# Patient Record
Sex: Male | Born: 1955 | Race: Black or African American | Hispanic: No | Smoking: Current every day smoker
Health system: Southern US, Community
[De-identification: ages and names within clinical notes are randomized; demographics above are authoritative.]

## PROBLEM LIST (undated history)

## (undated) DIAGNOSIS — Z955 Presence of coronary angioplasty implant and graft: Secondary | ICD-10-CM

## (undated) DIAGNOSIS — I2510 Atherosclerotic heart disease of native coronary artery without angina pectoris: Secondary | ICD-10-CM

## (undated) DIAGNOSIS — Z720 Tobacco use: Secondary | ICD-10-CM

## (undated) DIAGNOSIS — I639 Cerebral infarction, unspecified: Secondary | ICD-10-CM

## (undated) DIAGNOSIS — I10 Essential (primary) hypertension: Secondary | ICD-10-CM

## (undated) DIAGNOSIS — F1010 Alcohol abuse, uncomplicated: Secondary | ICD-10-CM

## (undated) HISTORY — PX: CORONARY STENT PLACEMENT: SHX1402

---

## 2000-08-12 ENCOUNTER — Encounter: Payer: Self-pay | Admitting: *Deleted

## 2000-08-12 ENCOUNTER — Emergency Department (HOSPITAL_COMMUNITY): Admission: EM | Admit: 2000-08-12 | Discharge: 2000-08-12 | Payer: Self-pay | Admitting: Emergency Medicine

## 2002-05-15 ENCOUNTER — Emergency Department (HOSPITAL_COMMUNITY): Admission: EM | Admit: 2002-05-15 | Discharge: 2002-05-15 | Payer: Self-pay | Admitting: Emergency Medicine

## 2002-05-15 ENCOUNTER — Encounter: Payer: Self-pay | Admitting: Emergency Medicine

## 2002-10-23 ENCOUNTER — Emergency Department (HOSPITAL_COMMUNITY): Admission: EM | Admit: 2002-10-23 | Discharge: 2002-10-23 | Payer: Self-pay | Admitting: Emergency Medicine

## 2003-10-21 ENCOUNTER — Emergency Department (HOSPITAL_COMMUNITY): Admission: EM | Admit: 2003-10-21 | Discharge: 2003-10-21 | Payer: Self-pay | Admitting: Emergency Medicine

## 2005-04-30 ENCOUNTER — Emergency Department (HOSPITAL_COMMUNITY): Admission: EM | Admit: 2005-04-30 | Discharge: 2005-04-30 | Payer: Self-pay | Admitting: Emergency Medicine

## 2006-02-21 ENCOUNTER — Emergency Department (HOSPITAL_COMMUNITY): Admission: EM | Admit: 2006-02-21 | Discharge: 2006-02-22 | Payer: Self-pay | Admitting: *Deleted

## 2007-03-28 ENCOUNTER — Ambulatory Visit: Payer: Self-pay | Admitting: Internal Medicine

## 2007-04-02 ENCOUNTER — Emergency Department (HOSPITAL_COMMUNITY): Admission: EM | Admit: 2007-04-02 | Discharge: 2007-04-02 | Payer: Self-pay | Admitting: Emergency Medicine

## 2009-11-04 ENCOUNTER — Emergency Department (HOSPITAL_COMMUNITY): Admission: EM | Admit: 2009-11-04 | Discharge: 2009-11-04 | Payer: Self-pay | Admitting: Emergency Medicine

## 2010-05-27 ENCOUNTER — Emergency Department (HOSPITAL_COMMUNITY): Admission: EM | Admit: 2010-05-27 | Discharge: 2010-05-27 | Payer: Self-pay | Admitting: Emergency Medicine

## 2010-05-27 ENCOUNTER — Ambulatory Visit: Payer: Self-pay | Admitting: Psychiatry

## 2010-05-27 ENCOUNTER — Inpatient Hospital Stay (HOSPITAL_COMMUNITY): Admission: RE | Admit: 2010-05-27 | Discharge: 2010-06-02 | Payer: Self-pay | Admitting: Psychiatry

## 2011-03-12 LAB — CBC
HCT: 44.5 % (ref 39.0–52.0)
Hemoglobin: 15.3 g/dL (ref 13.0–17.0)
MCHC: 34.4 g/dL (ref 30.0–36.0)
MCHC: 33.5 g/dL (ref 30.0–36.0)
RBC: 4.36 MIL/uL (ref 4.22–5.81)
RBC: 3.66 MIL/uL — ABNORMAL LOW (ref 4.22–5.81)
WBC: 10.8 10*3/uL — ABNORMAL HIGH (ref 4.0–10.5)

## 2011-03-12 LAB — GLUCOSE, CAPILLARY
Glucose-Capillary: 132 mg/dL — ABNORMAL HIGH (ref 70–99)
Glucose-Capillary: 134 mg/dL — ABNORMAL HIGH (ref 70–99)
Glucose-Capillary: 139 mg/dL — ABNORMAL HIGH (ref 70–99)
Glucose-Capillary: 155 mg/dL — ABNORMAL HIGH (ref 70–99)
Glucose-Capillary: 191 mg/dL — ABNORMAL HIGH (ref 70–99)
Glucose-Capillary: 207 mg/dL — ABNORMAL HIGH (ref 70–99)
Glucose-Capillary: 225 mg/dL — ABNORMAL HIGH (ref 70–99)
Glucose-Capillary: 276 mg/dL — ABNORMAL HIGH (ref 70–99)
Glucose-Capillary: 301 mg/dL — ABNORMAL HIGH (ref 70–99)
Glucose-Capillary: 351 mg/dL — ABNORMAL HIGH (ref 70–99)
Glucose-Capillary: 390 mg/dL — ABNORMAL HIGH (ref 70–99)

## 2011-03-12 LAB — URINALYSIS, ROUTINE W REFLEX MICROSCOPIC
Glucose, UA: 500 mg/dL — AB
Ketones, ur: NEGATIVE mg/dL
Ketones, ur: NEGATIVE mg/dL
Leukocytes, UA: NEGATIVE
Nitrite: NEGATIVE
Protein, ur: NEGATIVE mg/dL
Specific Gravity, Urine: 1.015 (ref 1.005–1.030)
Urobilinogen, UA: 1 mg/dL (ref 0.0–1.0)
pH: 5.5 (ref 5.0–8.0)

## 2011-03-12 LAB — BASIC METABOLIC PANEL
CO2: 24 mEq/L (ref 19–32)
Calcium: 9.7 mg/dL (ref 8.4–10.5)
GFR calc Af Amer: >60 mL/min (ref >60)
GFR calc non Af Amer: >60 mL/min (ref >60)
Potassium: 3.6 mEq/L (ref 3.5–5.1)
Sodium: 135 mEq/L (ref 135–145)

## 2011-03-12 LAB — RAPID URINE DRUG SCREEN, HOSP PERFORMED
Amphetamines: NOT DETECTED
Benzodiazepines: NOT DETECTED
Cocaine: POSITIVE — AB
Tetrahydrocannabinol: NOT DETECTED

## 2011-03-12 LAB — DIFFERENTIAL
Lymphocytes Relative: 20 % (ref 12–46)
Monocytes Absolute: 1 10*3/uL (ref 0.1–1.0)
Monocytes Relative: 7 % (ref 3–12)
Neutro Abs: 10.9 10*3/uL — ABNORMAL HIGH (ref 1.7–7.7)

## 2011-03-12 LAB — ETHANOL: Alcohol, Ethyl (B): <5 mg/dL (ref 0–10)

## 2011-03-12 LAB — HEPATIC FUNCTION PANEL
Alkaline Phosphatase: 97 U/L (ref 39–117)
Total Protein: 6 g/dL (ref 6.0–8.3)

## 2011-03-12 LAB — HEMOGLOBIN A1C
Hgb A1c MFr Bld: 7.6 % — ABNORMAL HIGH (ref <5.7)
Mean Plasma Glucose: 171 mg/dL — ABNORMAL HIGH (ref <117)

## 2012-07-03 ENCOUNTER — Emergency Department (HOSPITAL_COMMUNITY)
Admission: EM | Admit: 2012-07-03 | Discharge: 2012-07-04 | Disposition: A | Discharge status: Home / Self Care | Payer: Non-veteran care | Attending: Emergency Medicine | Admitting: Emergency Medicine

## 2012-07-03 ENCOUNTER — Other Ambulatory Visit: Payer: Self-pay

## 2012-07-03 ENCOUNTER — Emergency Department (HOSPITAL_COMMUNITY): Payer: Non-veteran care

## 2012-07-03 DIAGNOSIS — Z7982 Long term (current) use of aspirin: Secondary | ICD-10-CM | POA: Insufficient documentation

## 2012-07-03 DIAGNOSIS — I10 Essential (primary) hypertension: Secondary | ICD-10-CM | POA: Insufficient documentation

## 2012-07-03 DIAGNOSIS — R079 Chest pain, unspecified: Secondary | ICD-10-CM | POA: Insufficient documentation

## 2012-07-03 DIAGNOSIS — I2510 Atherosclerotic heart disease of native coronary artery without angina pectoris: Secondary | ICD-10-CM | POA: Insufficient documentation

## 2012-07-03 DIAGNOSIS — E119 Type 2 diabetes mellitus without complications: Secondary | ICD-10-CM | POA: Insufficient documentation

## 2012-07-03 DIAGNOSIS — R61 Generalized hyperhidrosis: Secondary | ICD-10-CM | POA: Insufficient documentation

## 2012-07-03 DIAGNOSIS — R0602 Shortness of breath: Secondary | ICD-10-CM | POA: Insufficient documentation

## 2012-07-03 DIAGNOSIS — Z79899 Other long term (current) drug therapy: Secondary | ICD-10-CM | POA: Insufficient documentation

## 2012-07-03 DIAGNOSIS — R110 Nausea: Secondary | ICD-10-CM | POA: Insufficient documentation

## 2012-07-03 HISTORY — DX: Essential (primary) hypertension: I10

## 2012-07-03 HISTORY — DX: Presence of coronary angioplasty implant and graft: Z95.5

## 2012-07-03 HISTORY — DX: Atherosclerotic heart disease of native coronary artery without angina pectoris: I25.10

## 2012-07-03 LAB — CBC
MCH: 33.5 pg (ref 26.0–34.0)
MCV: 96.3 fL (ref 78.0–100.0)
Platelets: 259 10*3/uL (ref 150–400)
RBC: 3.76 MIL/uL — ABNORMAL LOW (ref 4.22–5.81)
RDW: 12.3 % (ref 11.5–15.5)
WBC: 9 10*3/uL (ref 4.0–10.5)

## 2012-07-03 LAB — POCT I-STAT TROPONIN I: Troponin i, poc: 0.02 ng/mL (ref 0.00–0.08)

## 2012-07-03 LAB — BASIC METABOLIC PANEL
CO2: 26 mEq/L (ref 19–32)
Calcium: 9.5 mg/dL (ref 8.4–10.5)
Chloride: 103 mEq/L (ref 96–112)
Creatinine, Ser: 0.82 mg/dL (ref 0.50–1.35)
GFR calc Af Amer: >90 mL/min (ref >90)
Sodium: 141 mEq/L (ref 135–145)

## 2012-07-03 NOTE — ED Notes (Signed)
Patient C/O having chest pain. States that he was given NTG and ASA pre hospital and he is now pain free. States the he has had stents placed in the past.  Pain is described as pressure in the center of his chest and did not radiate. Patient C/O dyspnea also.  States that he had a spell a week ago where his whole left side went numb.  He states that he feels normal now.

## 2012-07-03 NOTE — ED Notes (Signed)
Patient transported to X-ray 

## 2012-07-03 NOTE — ED Notes (Addendum)
Patient was walking down town and decided to stop and have a drink.  States that he had 2 beers and started walking home.  Began having chest pain. Pain is in the center of his chest and non-radiating. Pain described as a pressure and was rated 5/10.

## 2012-07-04 ENCOUNTER — Encounter (HOSPITAL_COMMUNITY): Payer: Self-pay | Admitting: Emergency Medicine

## 2012-07-04 MED ORDER — LISINOPRIL-HYDROCHLOROTHIAZIDE 20-25 MG PO TABS: 1.0000 | ORAL_TABLET | Freq: Every day | ORAL | Status: DC

## 2012-07-04 MED ORDER — CLOPIDOGREL BISULFATE 75 MG PO TABS: 75.0000 mg | ORAL_TABLET | Freq: Every morning | ORAL | Status: DC

## 2012-07-04 MED ORDER — ASPIRIN 81 MG PO CHEW
324.0000 mg | CHEWABLE_TABLET | Freq: Once | ORAL | Status: AC
  Administered 2012-07-04: 324 mg via ORAL
  Filled 2012-07-04: qty 4

## 2012-07-04 MED ORDER — ASPIRIN EC 81 MG PO TBEC: 81.0000 mg | DELAYED_RELEASE_TABLET | Freq: Every day | ORAL | Status: DC

## 2012-07-04 MED ORDER — AMLODIPINE BESYLATE 2.5 MG PO TABS: 2.5000 mg | ORAL_TABLET | Freq: Every day | ORAL | Status: DC

## 2012-07-04 MED ORDER — SERTRALINE HCL 100 MG PO TABS: 100.0000 mg | ORAL_TABLET | Freq: Two times a day (BID) | ORAL | Status: DC

## 2012-07-04 MED ORDER — METFORMIN HCL 1000 MG PO TABS: 1000.0000 mg | ORAL_TABLET | Freq: Two times a day (BID) | ORAL | Status: AC

## 2012-07-04 MED ORDER — CLOPIDOGREL BISULFATE 75 MG PO TABS
75.0000 mg | ORAL_TABLET | Freq: Once | ORAL | Status: AC
  Administered 2012-07-04: 75 mg via ORAL
  Filled 2012-07-04: qty 1

## 2012-07-04 NOTE — ED Notes (Signed)
MD at bedside. 

## 2012-07-04 NOTE — ED Notes (Signed)
Pt ambulatory at discharge, pt alert and oriented x 4.  

## 2012-07-04 NOTE — ED Provider Notes (Signed)
History     CSN: [id number]  Arrival date & time 07/03/12  2255   First MD Initiated Contact with Patient 07/04/12 0031      Chief Complaint  Patient presents with  . Chest Pain    (Consider location/radiation/quality/duration/timing/severity/associated sxs/prior treatment) HPI This is a 56 year old black male who was recently stented for coronary artery disease. This was done at [facility]. He was walking downtown several hours ago and developed moderate chest discomfort. He describes the discomfort as pressure-like. It is like the pain he had prior to his stenting. It was associated with shortness of breath, diaphoresis and nausea. EMS was called and they gave him 2 or 3 sublingual nitroglycerin. This relieved his pain completely. He is pain-free at this time.  Past Medical History  Diagnosis Date  . Coronary artery disease   . Diabetes mellitus   . Hypertension   . Stented coronary artery     History reviewed. No pertinent past surgical history.  No family history on file.  History  Substance Use Topics  . Smoking status: Not on file  . Smokeless tobacco: Not on file  . Alcohol Use: Yes      Review of Systems  All other systems reviewed and are negative.    Allergies  Iodine  Home Medications   Current Outpatient Rx  Name Route Sig Dispense Refill  . AMLODIPINE BESYLATE 2.5 MG PO TABS Oral Take 2.5 mg by mouth daily.    . ASPIRIN EC 81 MG PO TBEC Oral Take 81 mg by mouth daily.    Marland Kitchen CLOPIDOGREL BISULFATE 75 MG PO TABS Oral Take 75 mg by mouth every morning.    Marland Kitchen LISINOPRIL-HYDROCHLOROTHIAZIDE 20-25 MG PO TABS Oral Take 1 tablet by mouth daily.    Marland Kitchen METFORMIN HCL 1000 MG PO TABS Oral Take 1,000 mg by mouth 2 (two) times daily with a meal.    . SERTRALINE HCL 100 MG PO TABS Oral Take 100 mg by mouth 2 (two) times daily.      BP 139/61  Pulse 69  Temp 98.2 F (36.8 C) (Oral)  Resp 20  SpO2 100%  Physical Exam General: Well-developed,  well-nourished male in no acute distress; appearance consistent with age of record HENT: normocephalic, atraumatic Eyes: pupils equal round and reactive to light; extraocular muscles intact; arcus senilis bilaterally Neck: supple Heart: regular rate and rhythm Lungs: clear to auscultation bilaterally Abdomen: soft; nondistended; nontender; hyperactive bowel so Extremities: No deformity; full range of motion Neurologic: Awake, alert and oriented; motor function intact in all extremities and symmetric; no facial droop Skin: Warm and dry Psychiatric: Normal mood and affect    ED Course  Procedures (including critical care time)     MDM   Nursing notes and vitals signs, including pulse oximetry, reviewed.  Summary of this visit's results, reviewed by myself:  Labs:  Results for orders placed during the hospital encounter of 07/03/12  CBC      Component Value Range   WBC 9.0  4.0 - 10.5 K/uL   RBC 3.76 (*) 4.22 - 5.81 MIL/uL   Hemoglobin 12.6 (*) 13.0 - 17.0 g/dL   HCT 78.2 (*) 95.6 - 21.3 %   MCV 96.3  78.0 - 100.0 fL   MCH 33.5  26.0 - 34.0 pg   MCHC 34.8  30.0 - 36.0 g/dL   RDW 08.6  57.8 - 46.9 %   Platelets 259  150 - 400 K/uL  BASIC METABOLIC PANEL  Component Value Range   Sodium 141  135 - 145 mEq/L   Potassium 3.7  3.5 - 5.1 mEq/L   Chloride 103  96 - 112 mEq/L   CO2 26  19 - 32 mEq/L   Glucose, Bld 194 (*) 70 - 99 mg/dL   BUN 7  6 - 23 mg/dL   Creatinine, Ser 1.91  0.50 - 1.35 mg/dL   Calcium 9.5  8.4 - 47.8 mg/dL   GFR calc non Af Amer >90  >90 mL/min   GFR calc Af Amer >90  >90 mL/min  POCT I-STAT TROPONIN I      Component Value Range   Troponin i, poc 0.02  0.00 - 0.08 ng/mL   Comment 3           POCT I-STAT TROPONIN I      Component Value Range   Troponin i, poc 0.02  0.00 - 0.08 ng/mL   Comment 3             Imaging Studies: Dg Chest 2 View  07/03/2012  *RADIOLOGY REPORT*  Clinical Data: Shortness of breath and chest pain.  CHEST - 2 VIEW   Comparison: Chest radiograph performed 05/27/2010  Findings: The lungs are well-aerated.  Mild peribronchial thickening is seen.  There is no evidence of focal opacification, pleural effusion or pneumothorax.  The heart is normal in size; the mediastinal contour is within normal limits.  No acute osseous abnormalities are seen.  IMPRESSION: Mild peribronchial thickening noted; lungs otherwise grossly clear.  Original Report Authenticated By: Tonia Ghent, M.D.    EKG Interpretation:  Date & Time: 07/04/2012 10:58 PM  Rate: 72  Rhythm: normal sinus rhythm  QRS Axis: normal  Intervals: normal  ST/T Wave abnormalities: T wave inversions inferolaterally  Conduction Disutrbances:left anterior fascicular block  Narrative Interpretation:   Old EKG Reviewed: none available  3:32 AM Dr. Fransisco Hertz of cardiology at [facility] was consulted. He states that per their protocol, recently stented patient's with chest pain are evaluated with EKG and 2 troponins 3 hours apart. As these have been negative he feels the patient can be safely discharged home. He stated the importance of the patient continuing to take his Plavix. The patient states he ran out 2 days ago. We will refill his medications. He is to followup with Dr. Ivan Croft of cardiology in [place]. He was also counseled against cocaine use.             Hanley Seamen, MD 07/04/12 [phone]

## 2012-12-30 ENCOUNTER — Emergency Department (HOSPITAL_COMMUNITY): Payer: Non-veteran care

## 2012-12-30 ENCOUNTER — Encounter (HOSPITAL_COMMUNITY): Payer: Self-pay | Admitting: *Deleted

## 2012-12-30 ENCOUNTER — Emergency Department (HOSPITAL_COMMUNITY)
Admission: EM | Admit: 2012-12-30 | Discharge: 2012-12-31 | Disposition: A | Discharge status: Home / Self Care | Payer: Non-veteran care | Attending: Emergency Medicine | Admitting: Emergency Medicine

## 2012-12-30 DIAGNOSIS — E119 Type 2 diabetes mellitus without complications: Secondary | ICD-10-CM | POA: Insufficient documentation

## 2012-12-30 DIAGNOSIS — Y9289 Other specified places as the place of occurrence of the external cause: Secondary | ICD-10-CM | POA: Insufficient documentation

## 2012-12-30 DIAGNOSIS — I2510 Atherosclerotic heart disease of native coronary artery without angina pectoris: Secondary | ICD-10-CM | POA: Insufficient documentation

## 2012-12-30 DIAGNOSIS — Y9301 Activity, walking, marching and hiking: Secondary | ICD-10-CM | POA: Insufficient documentation

## 2012-12-30 DIAGNOSIS — W172XXA Fall into hole, initial encounter: Secondary | ICD-10-CM | POA: Insufficient documentation

## 2012-12-30 DIAGNOSIS — Z9861 Coronary angioplasty status: Secondary | ICD-10-CM | POA: Insufficient documentation

## 2012-12-30 DIAGNOSIS — Z7982 Long term (current) use of aspirin: Secondary | ICD-10-CM | POA: Insufficient documentation

## 2012-12-30 DIAGNOSIS — S93609A Unspecified sprain of unspecified foot, initial encounter: Secondary | ICD-10-CM | POA: Insufficient documentation

## 2012-12-30 DIAGNOSIS — Z79899 Other long term (current) drug therapy: Secondary | ICD-10-CM | POA: Insufficient documentation

## 2012-12-30 DIAGNOSIS — I10 Essential (primary) hypertension: Secondary | ICD-10-CM | POA: Insufficient documentation

## 2012-12-30 DIAGNOSIS — S93602A Unspecified sprain of left foot, initial encounter: Secondary | ICD-10-CM

## 2012-12-30 NOTE — ED Notes (Signed)
The pt stepped in a hole earlier today and injured his lt foot.  Painful since

## 2012-12-31 NOTE — ED Provider Notes (Signed)
History     CSN: [id number]  Arrival date & time 12/30/12  2259   None     Chief Complaint  Patient presents with  . Foot Injury    (Consider location/radiation/quality/duration/timing/severity/associated sxs/prior treatment) HPI History provided by pt.   Pt stepped in a hole today while working out in the yard, inverted in his left foot, and now has severe, constant pain in forefoot and ankle.  Aggravated by bearing weight.  No associated paresthesias.  Has not taken anything for pain.  H/o tarsal bone spur.   Past Medical History  Diagnosis Date  . Coronary artery disease   . Diabetes mellitus   . Hypertension   . Stented coronary artery     History reviewed. No pertinent past surgical history.  No family history on file.  History  Substance Use Topics  . Smoking status: Not on file  . Smokeless tobacco: Not on file  . Alcohol Use: Yes      Review of Systems  All other systems reviewed and are negative.    Allergies  Iodine  Home Medications   Current Outpatient Rx  Name  Route  Sig  Dispense  Refill  . AMLODIPINE BESYLATE 2.5 MG PO TABS   Oral   Take 1 tablet (2.5 mg total) by mouth daily.   30 tablet   0   . ASPIRIN EC 81 MG PO TBEC   Oral   Take 1 tablet (81 mg total) by mouth daily.         Marland Kitchen CLOPIDOGREL BISULFATE 75 MG PO TABS   Oral   Take 1 tablet (75 mg total) by mouth every morning.   30 tablet   0   . LISINOPRIL-HYDROCHLOROTHIAZIDE 20-25 MG PO TABS   Oral   Take 1 tablet by mouth daily.   30 tablet   0   . METFORMIN HCL 1000 MG PO TABS   Oral   Take 1 tablet (1,000 mg total) by mouth 2 (two) times daily with a meal.   60 tablet   0   . SERTRALINE HCL 100 MG PO TABS   Oral   Take 1 tablet (100 mg total) by mouth 2 (two) times daily.   60 tablet   0     BP 131/80  Pulse 85  Temp 98.3 F (36.8 C) (Oral)  Resp 20  SpO2 96%  Physical Exam  Nursing note and vitals reviewed. Constitutional: He is oriented to person,  place, and time. He appears well-developed and well-nourished. No distress.  HENT:  Head: Normocephalic and atraumatic.  Eyes:       Normal appearance  Neck: Normal range of motion.  Pulmonary/Chest: Effort normal.  Musculoskeletal: Normal range of motion.       Edema on dorsolateral surface of left forefoot.  Tenderness of entire forefoot as well as at and inferior to lateral malleolus.  Pain w/ plantar flexion and lateral rotation.  2+ DP pulse and distal sensation intact.    Neurological: He is alert and oriented to person, place, and time.  Psychiatric: He has a normal mood and affect. His behavior is normal.    ED Course  Procedures (including critical care time)  Labs Reviewed - No data to display Dg Foot Complete Left  12/30/2012  *RADIOLOGY REPORT*  Clinical Data: Twisting injury, pain.  LEFT FOOT - COMPLETE 3+ VIEW  Comparison: None.  Findings: There is no acute bony or joint abnormality.  The navicular bone is sclerotic and  flattened, particularly on the lateral side.  There is advanced talonavicular degenerative change. Soft tissue structures are unremarkable.  IMPRESSION:  1.  No acute finding. 2.  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   Original Report Authenticated By: Holley Dexter, M.D.      1. Sprain of left foot       MDM  [phone] M presents w/ left foot injury.  NV intact on exam and Xray neg for fx.  Results discussed w/ pt.  Ortho tech provided him w/ cam walker and pt d/c'd home w/ short course of vicodin.  Recommended RICE and low dose ibuprofen bid.  Patient referred to ortho for persistent/worsening/recurrent symptoms.    Patient was seen during EMR downtime and I overlooked the finding of AVN of navicular bone on xray of L foot.  Patient has been contacted at home.  He has f/u scheduled w/ an orthopedist through the [place] in 3 weeks.  He reports persistent throbbing pain today and plans  to return to the ED for stronger pain medication.  I recommended that he go to [facility] in the am if he is unable to see his own physician.  8:58 PM (01/01/12)        Otilio Miu, PA-C 12/31/12 2059

## 2012-12-31 NOTE — ED Notes (Signed)
Ortho Tech applied

## 2013-01-01 NOTE — ED Provider Notes (Signed)
  Medical screening examination/treatment/procedure(s) were performed by non-physician practitioner and as supervising physician I was immediately available for consultation/collaboration.    Kamdyn Colborn D Cabrini Ruggieri, MD 01/01/13 0001 

## 2013-03-06 ENCOUNTER — Encounter (HOSPITAL_COMMUNITY): Payer: Self-pay | Admitting: *Deleted

## 2013-03-06 ENCOUNTER — Emergency Department (HOSPITAL_COMMUNITY): Payer: Self-pay

## 2013-03-06 ENCOUNTER — Emergency Department (HOSPITAL_COMMUNITY)
Admission: EM | Admit: 2013-03-06 | Discharge: 2013-03-07 | Disposition: A | Discharge status: Home / Self Care | Payer: Self-pay | Attending: Emergency Medicine | Admitting: Emergency Medicine

## 2013-03-06 DIAGNOSIS — F17200 Nicotine dependence, unspecified, uncomplicated: Secondary | ICD-10-CM | POA: Insufficient documentation

## 2013-03-06 DIAGNOSIS — Z9861 Coronary angioplasty status: Secondary | ICD-10-CM | POA: Insufficient documentation

## 2013-03-06 DIAGNOSIS — I2510 Atherosclerotic heart disease of native coronary artery without angina pectoris: Secondary | ICD-10-CM | POA: Insufficient documentation

## 2013-03-06 DIAGNOSIS — Z7982 Long term (current) use of aspirin: Secondary | ICD-10-CM | POA: Insufficient documentation

## 2013-03-06 DIAGNOSIS — Z7902 Long term (current) use of antithrombotics/antiplatelets: Secondary | ICD-10-CM | POA: Insufficient documentation

## 2013-03-06 DIAGNOSIS — Y929 Unspecified place or not applicable: Secondary | ICD-10-CM | POA: Insufficient documentation

## 2013-03-06 DIAGNOSIS — E119 Type 2 diabetes mellitus without complications: Secondary | ICD-10-CM | POA: Insufficient documentation

## 2013-03-06 DIAGNOSIS — S0990XA Unspecified injury of head, initial encounter: Secondary | ICD-10-CM

## 2013-03-06 DIAGNOSIS — Z79899 Other long term (current) drug therapy: Secondary | ICD-10-CM | POA: Insufficient documentation

## 2013-03-06 DIAGNOSIS — Y939 Activity, unspecified: Secondary | ICD-10-CM | POA: Insufficient documentation

## 2013-03-06 DIAGNOSIS — S060X9A Concussion with loss of consciousness of unspecified duration, initial encounter: Secondary | ICD-10-CM | POA: Insufficient documentation

## 2013-03-06 DIAGNOSIS — W208XXA Other cause of strike by thrown, projected or falling object, initial encounter: Secondary | ICD-10-CM | POA: Insufficient documentation

## 2013-03-06 NOTE — ED Notes (Signed)
Given fall risk card and fall arm band, fall risk explained. Placed in w/c.

## 2013-03-06 NOTE — ED Notes (Signed)
Dr. Lavella Lemons at bedside assessing patient.

## 2013-03-06 NOTE — ED Notes (Addendum)
Hit in head with large tree limb, occurred around 1530, "thought i would get better, but I am getting dizzier and neck is hurting", "can't walk straight, have fallen twice". Pinpoints pain to head throbbing, L lateral/ posterior neck down to L shoulder. BUE grip strength equal and strong, denies numbness or tingling, radial pulses equal and strong (denies: LOC, vomiting or visual changes). Scant abrasion noted to top of head. Alert, NAD, calm, interactive, speech clear. PERRL 3mm bilateral brisk. Takes aspirin and plavix. Admits to beer this evening. c-collar placed.  '

## 2013-03-06 NOTE — ED Notes (Signed)
Patient reports that he was hit in the head by a tree branch that fell while he was working in the yard today.  Patient reports that after getting hit, he was dizzy, so he stayed on the ground for 10-15 minutes until he felt better.  Denies loss of consciousness.  Patient reports increased dizziness throughout the day, especially while walking.  Patient alert and oriented x4; PERRL present.  Upon arrival to room, patient changed into gown and connected to continuous cardiac, pulse ox, and blood pressure monitor.  Will continue to monitor.

## 2013-03-07 NOTE — ED Notes (Signed)
Patient currently asleep in bed; no respiratory or acute distress noted.  Will continue to monitor.  Patient pending disposition/further orders from EDP at this time.

## 2013-03-07 NOTE — ED Notes (Signed)
Patient given copy of discharge paperwork; went over discharge instructions with patient.  Patient instructed to follow up with primary care physician and to return to the ED for new, worsening, or concerning symptoms.

## 2013-03-07 NOTE — ED Provider Notes (Signed)
History     CSN: [id number]  Arrival date & time 03/06/13  2056   First MD Initiated Contact with Patient 03/06/13 2313      Chief Complaint  Patient presents with  . Head Injury    (Consider location/radiation/quality/duration/timing/severity/associated sxs/prior treatment) HPI  Patient is a 57 yo man who was doing yard work when a tree branch fell on the top of his head. He says he felt stunned but denies LOC. He had a 6/10 diffuse headache on presentation but is currently pain free. Denies neck pain. Denies trauma or pain to any other region. States that Td is utd.   Pt says he initially felt "woozy" on his feet. But, we ambulated him back and forth in the hallway and he was asymptomatic with a normal gait.   Past Medical History  Diagnosis Date  . Coronary artery disease   . Diabetes mellitus   . Hypertension   . Stented coronary artery     History reviewed. No pertinent past surgical history.  No family history on file.  History  Substance Use Topics  . Smoking status: Current Every Day Smoker  . Smokeless tobacco: Not on file  . Alcohol Use: Yes      Review of Systems gen: negative head: As per history of present illness, otherwise negative nose: no nose pain, no complaints of pain or trauma Mouth: no mouth or dental pain, no complaints of trauma Neck: denies neck pain Resp: no sob CV: no chest pain Abd: no abd pain GU: no gross hemturia or dysuria Back: no back pain ext: no extremity pain Skin: no complaints of abrasion, laceration Psyche: no complaints.   Nursing notes reviewed.  Allergies  Iodine  Home Medications   Current Outpatient Rx  Name  Route  Sig  Dispense  Refill  . amLODipine (NORVASC) 2.5 MG tablet   Oral   Take 1 tablet (2.5 mg total) by mouth daily.   30 tablet   0   . aspirin EC 81 MG tablet   Oral   Take 1 tablet (81 mg total) by mouth daily.         . clopidogrel (PLAVIX) 75 MG tablet   Oral   Take 1 tablet (75  mg total) by mouth every morning.   30 tablet   0   . lisinopril-hydrochlorothiazide (PRINZIDE,ZESTORETIC) 20-25 MG per tablet   Oral   Take 1 tablet by mouth daily.   30 tablet   0   . metFORMIN (GLUCOPHAGE) 1000 MG tablet   Oral   Take 1 tablet (1,000 mg total) by mouth 2 (two) times daily with a meal.   60 tablet   0   . sertraline (ZOLOFT) 100 MG tablet   Oral   Take 1 tablet (100 mg total) by mouth 2 (two) times daily.   60 tablet   0     BP 133/60  Pulse 70  Temp(Src) 98.3 F (36.8 C) (Oral)  Resp 18  SpO2 95%  Physical Exam  Gen: appears comfortable, no acute distress head: 1 cm superficial abrasion to the top of the head of the midline, no hematoma, otherwise NCAT eyes: PERLA, EOMI mouth: no signs of trauma Neck: soft, nontender, no c spine ttp Resp: lungs CTA B CV: RRR, no murmur, palp pulses in all extremities, skin appears well perfused Abdomen: Soft nontender nondistended Back: no steps offs, no spinal ttp Pelvis: nontender, stable MSK: no ttp, FROM without pain at both shoulder, elbows, wrists,  fingers, hips, knees, ankles.   Skin: no lacs, abrasions, Neuro: no focal deficits, normal gait, 5 over 5 strength both arms and legs..     ED Course  Procedures (including critical care time)  Labs Reviewed - No data to display Ct Head Wo Contrast  03/06/2013  *RADIOLOGY REPORT*  Clinical Data:  The patient was hit in the head with a large tree limb around 15 30.  Persistent dizziness and neck pain.  Multiple falls.  Left lateral and posterior neck pain down to the left shoulder.  CT HEAD WITHOUT CONTRAST CT CERVICAL SPINE WITHOUT CONTRAST  Technique:  Multidetector CT imaging of the head and cervical spine was performed following the standard protocol without intravenous contrast.  Multiplanar CT image reconstructions of the cervical spine were also generated.  Comparison:   None  CT HEAD  Findings: Mild diffuse cerebral atrophy.  No ventricular dilatation.   No mass effect or midline shift.  No abnormal extra- axial fluid collections.  Gray-white matter junctions are distinct. Basal cisterns are not effaced.  No evidence of acute intracranial hemorrhage.  No depressed skull fractures.  Visualized paranasal sinuses and mastoid air cells are not significantly opacified. Vascular calcifications. Prominence of the sella turcica measuring 19 mm diameter.  CSF appears to extend into the sella consistent with empty sella.  IMPRESSION: No acute intracranial abnormalities.  CT CERVICAL SPINE  Findings: There is reversal of the usual cervical lordosis which is likely degenerative.  No abnormal anterior subluxation.  Facet joints demonstrate normal alignment.  Degenerative changes are present in the cervical spine with degenerative disc disease at C4- 5, C5-6, C6-7, and C7-T1 levels.  Disc space narrowing and prominent endplate hypertrophic changes at these levels. Moderately prominent disc osteophyte complex is demonstrated is C4- 5, C5-6, and C6-7 levels.  No significant encroachment upon the central canal is appreciated.  No vertebral compression deformities.  No prevertebral soft tissue swelling.  Lateral masses of C1 appear symmetrical.  The odontoid process appears intact.  No focal bone lesion or bone destruction.  Bone cortex and trabecular architecture appear intact.  No abnormal paraspinal soft tissue infiltration. Diffuse enlargement of the thyroid gland.  Calcified granuloma in the right lung apex.  IMPRESSION: Diffuse degenerative changes.  No displaced cervical fractures demonstrated.  Reversal of the usual cervical lordosis is likely due to degenerative change.   Original Report Authenticated By: Burman Nieves, M.D.    Ct Cervical Spine Wo Contrast  03/06/2013  *RADIOLOGY REPORT*  Clinical Data:  The patient was hit in the head with a large tree limb around 15 30.  Persistent dizziness and neck pain.  Multiple falls.  Left lateral and posterior neck pain down  to the left shoulder.  CT HEAD WITHOUT CONTRAST CT CERVICAL SPINE WITHOUT CONTRAST  Technique:  Multidetector CT imaging of the head and cervical spine was performed following the standard protocol without intravenous contrast.  Multiplanar CT image reconstructions of the cervical spine were also generated.  Comparison:   None  CT HEAD  Findings: Mild diffuse cerebral atrophy.  No ventricular dilatation.  No mass effect or midline shift.  No abnormal extra- axial fluid collections.  Gray-white matter junctions are distinct. Basal cisterns are not effaced.  No evidence of acute intracranial hemorrhage.  No depressed skull fractures.  Visualized paranasal sinuses and mastoid air cells are not significantly opacified. Vascular calcifications. Prominence of the sella turcica measuring 19 mm diameter.  CSF appears to extend into the sella consistent with empty sella.  IMPRESSION: No acute intracranial abnormalities.  CT CERVICAL SPINE  Findings: There is reversal of the usual cervical lordosis which is likely degenerative.  No abnormal anterior subluxation.  Facet joints demonstrate normal alignment.  Degenerative changes are present in the cervical spine with degenerative disc disease at C4- 5, C5-6, C6-7, and C7-T1 levels.  Disc space narrowing and prominent endplate hypertrophic changes at these levels. Moderately prominent disc osteophyte complex is demonstrated is C4- 5, C5-6, and C6-7 levels.  No significant encroachment upon the central canal is appreciated.  No vertebral compression deformities.  No prevertebral soft tissue swelling.  Lateral masses of C1 appear symmetrical.  The odontoid process appears intact.  No focal bone lesion or bone destruction.  Bone cortex and trabecular architecture appear intact.  No abnormal paraspinal soft tissue infiltration. Diffuse enlargement of the thyroid gland.  Calcified granuloma in the right lung apex.  IMPRESSION: Diffuse degenerative changes.  No displaced cervical  fractures demonstrated.  Reversal of the usual cervical lordosis is likely due to degenerative change.   Original Report Authenticated By: Burman Nieves, M.D.      1. Concussion, with loss of consciousness of unspecified duration, initial encounter   2. Head injury, acute, initial encounter       MDM  See above.         Brandt Loosen, MD 03/07/13 [phone]

## 2013-03-07 NOTE — ED Notes (Signed)
Patient currently resting quietly in bed; no respiratory or acute distress noted.  Patient updated on plan of care; informed patient that we are currently waiting on further orders from EDP.  Patient denies any needs at this time.  Patient ambulated in hall, per Dr. Lavella Lemons request.  Patient tolerated well, but complaining of constant dizziness.  Dr. Lavella Lemons notified.  Will continue to monitor.

## 2015-04-21 ENCOUNTER — Encounter (HOSPITAL_COMMUNITY): Payer: Self-pay | Admitting: Emergency Medicine

## 2015-04-21 ENCOUNTER — Emergency Department (HOSPITAL_COMMUNITY): Payer: Non-veteran care

## 2015-04-21 ENCOUNTER — Inpatient Hospital Stay (HOSPITAL_COMMUNITY)
Admission: EM | Admit: 2015-04-21 | Discharge: 2015-04-24 | DRG: 066 | Disposition: A | Discharge status: Home / Self Care | Payer: Non-veteran care | Attending: Family Medicine | Admitting: Family Medicine

## 2015-04-21 DIAGNOSIS — I2510 Atherosclerotic heart disease of native coronary artery without angina pectoris: Secondary | ICD-10-CM | POA: Diagnosis present

## 2015-04-21 DIAGNOSIS — I25118 Atherosclerotic heart disease of native coronary artery with other forms of angina pectoris: Secondary | ICD-10-CM | POA: Diagnosis not present

## 2015-04-21 DIAGNOSIS — E119 Type 2 diabetes mellitus without complications: Secondary | ICD-10-CM

## 2015-04-21 DIAGNOSIS — Z8249 Family history of ischemic heart disease and other diseases of the circulatory system: Secondary | ICD-10-CM

## 2015-04-21 DIAGNOSIS — Z833 Family history of diabetes mellitus: Secondary | ICD-10-CM | POA: Diagnosis not present

## 2015-04-21 DIAGNOSIS — F1010 Alcohol abuse, uncomplicated: Secondary | ICD-10-CM | POA: Diagnosis present

## 2015-04-21 DIAGNOSIS — Z955 Presence of coronary angioplasty implant and graft: Secondary | ICD-10-CM | POA: Diagnosis not present

## 2015-04-21 DIAGNOSIS — Z888 Allergy status to other drugs, medicaments and biological substances status: Secondary | ICD-10-CM | POA: Diagnosis not present

## 2015-04-21 DIAGNOSIS — Z79899 Other long term (current) drug therapy: Secondary | ICD-10-CM

## 2015-04-21 DIAGNOSIS — Z7982 Long term (current) use of aspirin: Secondary | ICD-10-CM

## 2015-04-21 DIAGNOSIS — F1410 Cocaine abuse, uncomplicated: Secondary | ICD-10-CM | POA: Diagnosis present

## 2015-04-21 DIAGNOSIS — I10 Essential (primary) hypertension: Secondary | ICD-10-CM | POA: Diagnosis not present

## 2015-04-21 DIAGNOSIS — Z7902 Long term (current) use of antithrombotics/antiplatelets: Secondary | ICD-10-CM | POA: Diagnosis not present

## 2015-04-21 DIAGNOSIS — R42 Dizziness and giddiness: Secondary | ICD-10-CM

## 2015-04-21 DIAGNOSIS — I639 Cerebral infarction, unspecified: Secondary | ICD-10-CM | POA: Diagnosis present

## 2015-04-21 DIAGNOSIS — F17210 Nicotine dependence, cigarettes, uncomplicated: Secondary | ICD-10-CM | POA: Diagnosis present

## 2015-04-21 DIAGNOSIS — E785 Hyperlipidemia, unspecified: Secondary | ICD-10-CM | POA: Diagnosis present

## 2015-04-21 DIAGNOSIS — I6789 Other cerebrovascular disease: Secondary | ICD-10-CM | POA: Diagnosis not present

## 2015-04-21 DIAGNOSIS — Z720 Tobacco use: Secondary | ICD-10-CM

## 2015-04-21 DIAGNOSIS — R260 Ataxic gait: Secondary | ICD-10-CM | POA: Diagnosis not present

## 2015-04-21 HISTORY — DX: Alcohol abuse, uncomplicated: F10.10

## 2015-04-21 HISTORY — DX: Tobacco use: Z72.0

## 2015-04-21 LAB — CBC WITH DIFFERENTIAL/PLATELET
BASOS ABS: 0 10*3/uL (ref 0.0–0.1)
Basophils Relative: 0 % (ref 0–1)
EOS ABS: 0.2 10*3/uL (ref 0.0–0.7)
EOS PCT: 2 % (ref 0–5)
HEMATOCRIT: 37.3 % — AB (ref 39.0–52.0)
Hemoglobin: 12.5 g/dL — ABNORMAL LOW (ref 13.0–17.0)
LYMPHS ABS: 2.5 10*3/uL (ref 0.7–4.0)
Lymphocytes Relative: 24 % (ref 12–46)
MCH: 33.8 pg (ref 26.0–34.0)
MCHC: 33.5 g/dL (ref 30.0–36.0)
MCV: 100.8 fL — AB (ref 78.0–100.0)
MONO ABS: 0.8 10*3/uL (ref 0.1–1.0)
Monocytes Relative: 8 % (ref 3–12)
Neutro Abs: 6.7 10*3/uL (ref 1.7–7.7)
Neutrophils Relative %: 66 % (ref 43–77)
PLATELETS: 341 10*3/uL (ref 150–400)
RBC: 3.7 MIL/uL — ABNORMAL LOW (ref 4.22–5.81)
RDW: 12.8 % (ref 11.5–15.5)
WBC: 10.1 10*3/uL (ref 4.0–10.5)

## 2015-04-21 LAB — COMPREHENSIVE METABOLIC PANEL
ALBUMIN: 3.8 g/dL (ref 3.5–5.2)
ALK PHOS: 104 U/L (ref 39–117)
ALT: 13 U/L (ref 0–53)
ANION GAP: 9 (ref 5–15)
AST: 14 U/L (ref 0–37)
BILIRUBIN TOTAL: 0.9 mg/dL (ref 0.3–1.2)
BUN: 7 mg/dL (ref 6–23)
CHLORIDE: 105 mmol/L (ref 96–112)
CO2: 23 mmol/L (ref 19–32)
Calcium: 9.4 mg/dL (ref 8.4–10.5)
Creatinine, Ser: 0.85 mg/dL (ref 0.50–1.35)
GFR calc Af Amer: >90 mL/min (ref >90)
GFR calc non Af Amer: >90 mL/min (ref >90)
Glucose, Bld: 129 mg/dL — ABNORMAL HIGH (ref 70–99)
POTASSIUM: 4 mmol/L (ref 3.5–5.1)
Sodium: 137 mmol/L (ref 135–145)
TOTAL PROTEIN: 6.8 g/dL (ref 6.0–8.3)

## 2015-04-21 LAB — ETHANOL: Alcohol, Ethyl (B): <5 mg/dL (ref 0–9)

## 2015-04-21 LAB — CBG MONITORING, ED
GLUCOSE-CAPILLARY: 97 mg/dL (ref 70–99)
Glucose-Capillary: 90 mg/dL (ref 70–99)

## 2015-04-21 LAB — I-STAT TROPONIN, ED: TROPONIN I, POC: 0.01 ng/mL (ref 0.00–0.08)

## 2015-04-21 LAB — PROTIME-INR
INR: 1 (ref 0.00–1.49)
Prothrombin Time: 13.3 seconds (ref 11.6–15.2)

## 2015-04-21 MED ORDER — LORAZEPAM 2 MG/ML IJ SOLN: 0.0000 mg | Freq: Two times a day (BID) | INTRAMUSCULAR | Status: DC

## 2015-04-21 MED ORDER — ASPIRIN 325 MG PO TABS
325.0000 mg | ORAL_TABLET | Freq: Every day | ORAL | Status: DC
  Administered 2015-04-22 – 2015-04-24 (×3): 325 mg via ORAL
  Filled 2015-04-21 (×3): qty 1

## 2015-04-21 MED ORDER — METOPROLOL TARTRATE 12.5 MG HALF TABLET
12.5000 mg | ORAL_TABLET | Freq: Two times a day (BID) | ORAL | Status: DC
  Administered 2015-04-22 – 2015-04-24 (×6): 12.5 mg via ORAL
  Filled 2015-04-21 (×7): qty 1

## 2015-04-21 MED ORDER — LORAZEPAM 2 MG/ML IJ SOLN
1.0000 mg | Freq: Once | INTRAMUSCULAR | Status: AC
  Administered 2015-04-21: 1 mg via INTRAVENOUS

## 2015-04-21 MED ORDER — LORAZEPAM 1 MG PO TABS: 1.0000 mg | ORAL_TABLET | Freq: Four times a day (QID) | ORAL | Status: DC | PRN

## 2015-04-21 MED ORDER — FOLIC ACID 1 MG PO TABS
1.0000 mg | ORAL_TABLET | Freq: Every day | ORAL | Status: DC
  Administered 2015-04-22 – 2015-04-24 (×3): 1 mg via ORAL
  Filled 2015-04-21 (×3): qty 1

## 2015-04-21 MED ORDER — THIAMINE HCL 100 MG/ML IJ SOLN
100.0000 mg | Freq: Every day | INTRAMUSCULAR | Status: DC
  Filled 2015-04-21 (×3): qty 1

## 2015-04-21 MED ORDER — PANTOPRAZOLE SODIUM 40 MG PO TBEC
40.0000 mg | DELAYED_RELEASE_TABLET | Freq: Every day | ORAL | Status: DC
  Administered 2015-04-22 – 2015-04-24 (×3): 40 mg via ORAL
  Filled 2015-04-21 (×3): qty 1

## 2015-04-21 MED ORDER — LORAZEPAM 2 MG/ML IJ SOLN: 1.0000 mg | Freq: Four times a day (QID) | INTRAMUSCULAR | Status: DC | PRN

## 2015-04-21 MED ORDER — ASPIRIN 300 MG RE SUPP
300.0000 mg | Freq: Every day | RECTAL | Status: DC
  Filled 2015-04-21 (×3): qty 1

## 2015-04-21 MED ORDER — LORAZEPAM 2 MG/ML IJ SOLN
INTRAMUSCULAR | Status: AC
  Administered 2015-04-21: 1 mg via INTRAVENOUS
  Filled 2015-04-21: qty 1

## 2015-04-21 MED ORDER — VITAMIN B-1 100 MG PO TABS
100.0000 mg | ORAL_TABLET | Freq: Every day | ORAL | Status: DC
  Administered 2015-04-22 – 2015-04-24 (×3): 100 mg via ORAL
  Filled 2015-04-21 (×3): qty 1

## 2015-04-21 MED ORDER — STROKE: EARLY STAGES OF RECOVERY BOOK
Freq: Once | Status: AC
  Administered 2015-04-22
  Filled 2015-04-21: qty 1

## 2015-04-21 MED ORDER — LORAZEPAM 2 MG/ML IJ SOLN
0.0000 mg | Freq: Four times a day (QID) | INTRAMUSCULAR | Status: AC
  Administered 2015-04-22: 0.5 mg via INTRAVENOUS
  Filled 2015-04-21: qty 1

## 2015-04-21 MED ORDER — INSULIN ASPART 100 UNIT/ML ~~LOC~~ SOLN: 0.0000 [IU] | Freq: Three times a day (TID) | SUBCUTANEOUS | Status: DC

## 2015-04-21 MED ORDER — NICOTINE 21 MG/24HR TD PT24
21.0000 mg | MEDICATED_PATCH | Freq: Every day | TRANSDERMAL | Status: DC
  Administered 2015-04-22 – 2015-04-24 (×3): 21 mg via TRANSDERMAL
  Filled 2015-04-21 (×3): qty 1

## 2015-04-21 MED ORDER — HEPARIN SODIUM (PORCINE) 5000 UNIT/ML IJ SOLN
5000.0000 [IU] | Freq: Three times a day (TID) | INTRAMUSCULAR | Status: DC
  Administered 2015-04-22 – 2015-04-24 (×8): 5000 [IU] via SUBCUTANEOUS
  Filled 2015-04-21 (×11): qty 1

## 2015-04-21 MED ORDER — ATORVASTATIN CALCIUM 80 MG PO TABS
80.0000 mg | ORAL_TABLET | Freq: Every day | ORAL | Status: DC
  Administered 2015-04-22 – 2015-04-23 (×2): 80 mg via ORAL
  Filled 2015-04-21 (×3): qty 1

## 2015-04-21 MED ORDER — SENNOSIDES-DOCUSATE SODIUM 8.6-50 MG PO TABS: 1.0000 | ORAL_TABLET | Freq: Every evening | ORAL | Status: DC | PRN

## 2015-04-21 NOTE — ED Provider Notes (Signed)
CSN: [id number]     Arrival date & time 04/21/15  1410 History   First MD Initiated Contact with Patient 04/21/15 1747     Chief Complaint  Patient presents with  . Dizziness   (Consider location/radiation/quality/duration/timing/severity/associated sxs/prior Treatment) Patient is a 59 y.o. male presenting with dizziness. The history is provided by the patient. No language interpreter was used.  Dizziness Quality:  Lightheadedness Severity:  Mild Onset quality:  Sudden Timing:  Intermittent Progression:  Waxing and waning Chronicity:  New Context: physical activity and standing up   Context: not when bending over, not with eye movement and not with head movement   Relieved by:  Being still Worsened by:  Sitting upright, standing up and movement Ineffective treatments:  None tried Associated symptoms: no blood in stool, no chest pain, no diarrhea, no headaches, no nausea, no palpitations, no shortness of breath, no syncope, no vision changes, no vomiting and no weakness   Risk factors: heart disease   Risk factors: no hx of stroke     Past Medical History  Diagnosis Date  . Coronary artery disease   . Diabetes mellitus   . Hypertension   . Stented coronary artery    History reviewed. No pertinent past surgical history. History reviewed. No pertinent family history. History  Substance Use Topics  . Smoking status: Current Every Day Smoker  . Smokeless tobacco: Not on file  . Alcohol Use: Yes    Review of Systems  Constitutional: Negative for fever and fatigue.  Respiratory: Negative for chest tightness and shortness of breath.   Cardiovascular: Negative for chest pain, palpitations and syncope.  Gastrointestinal: Negative for nausea, vomiting, abdominal pain, diarrhea and blood in stool.  Musculoskeletal: Positive for gait problem (ataxic).  Neurological: Positive for dizziness. Negative for facial asymmetry, speech difficulty, weakness, light-headedness, numbness and  headaches.  Psychiatric/Behavioral: Negative for confusion.  All other systems reviewed and are negative.     Allergies  Iodine  Home Medications   Prior to Admission medications   Medication Sig Start Date End Date Taking? Authorizing Provider  amLODipine (NORVASC) 2.5 MG tablet Take 1 tablet (2.5 mg total) by mouth daily. 07/04/12   Paula LibraJohn Molpus, MD  aspirin EC 81 MG tablet Take 1 tablet (81 mg total) by mouth daily. 07/04/12   John Molpus, MD  clopidogrel (PLAVIX) 75 MG tablet Take 1 tablet (75 mg total) by mouth every morning. 07/04/12   John Molpus, MD  lisinopril-hydrochlorothiazide (PRINZIDE,ZESTORETIC) 20-25 MG per tablet Take 1 tablet by mouth daily. 07/04/12   John Molpus, MD  metFORMIN (GLUCOPHAGE) 1000 MG tablet Take 1 tablet (1,000 mg total) by mouth 2 (two) times daily with a meal. 07/04/12   Paula LibraJohn Molpus, MD  sertraline (ZOLOFT) 100 MG tablet Take 1 tablet (100 mg total) by mouth 2 (two) times daily. 07/04/12   John Molpus, MD   BP 136/70 mmHg  Pulse 71  Temp(Src) 98.5 F (36.9 C) (Oral)  Resp 18  Ht 5\' 6"  (1.676 m)  Wt 165 lb (74.844 kg)  BMI 26.64 kg/m2  SpO2 97% Physical Exam  Constitutional: He is oriented to person, place, and time. He appears well-developed and well-nourished. No distress.  HENT:  Head: Normocephalic and atraumatic.  Nose: Nose normal.  Mouth/Throat: Oropharynx is clear and moist. No oropharyngeal exudate.  Eyes: EOM are normal. Pupils are equal, round, and reactive to light.  No nystagmus.  EOMI.   Neck: Normal range of motion. Neck supple.  Cardiovascular: Normal rate, regular rhythm,  normal heart sounds and intact distal pulses.   No murmur heard. Pulmonary/Chest: Effort normal and breath sounds normal. No respiratory distress. He has no wheezes. He exhibits no tenderness.  Abdominal: Soft. He exhibits no distension. There is no tenderness. There is no guarding.  Musculoskeletal: Normal range of motion. He exhibits no tenderness.   Neurological: He is alert and oriented to person, place, and time. No cranial nerve deficit. Coordination abnormal.  Full strength and sensation throughout Normal finger to nose and heel to shin, good rapidly alternating hand movements bilaterally.  Unsteady when standing initially then able to steady himself and had a negative Romberg test after approximately 1 minute of standing.   When ambulating, patient subtly fell several times towards his left.  He was able to turn easily, had an overall benign gait, then would occasionally fall towards the left.    Skin: Skin is warm and dry. He is not diaphoretic. No pallor.  Psychiatric: He has a normal mood and affect. His behavior is normal. Judgment and thought content normal.  Nursing note and vitals reviewed.   ED Course  Procedures (including critical care time) Labs Review Labs Reviewed  COMPREHENSIVE METABOLIC PANEL - Abnormal; Notable for the following:    Glucose, Bld 129 (*)    All other components within normal limits  CBC WITH DIFFERENTIAL/PLATELET - Abnormal; Notable for the following:    RBC 3.70 (*)    Hemoglobin 12.5 (*)    HCT 37.3 (*)    MCV 100.8 (*)    All other components within normal limits  URINE RAPID DRUG SCREEN (HOSP PERFORMED) - Abnormal; Notable for the following:    Cocaine POSITIVE (*)    All other components within normal limits  GLUCOSE, CAPILLARY - Abnormal; Notable for the following:    Glucose-Capillary 264 (*)    All other components within normal limits    Imaging Review [name]  04/21/2015   CLINICAL DATA:  Initial evaluation for acute lightheadedness, ataxia, dizziness.  EXAM: MRI HEAD WITHOUT CONTRAST  TECHNIQUE: Multiplanar, multiecho pulse sequences of the brain and surrounding structures were obtained without intravenous contrast.  COMPARISON:  Prior CT from 03/06/2013.  FINDINGS: CSF volume within normal limits for patient age. Patchy and confluent T2/FLAIR hyperintensity within  the periventricular and deep white matter both cerebral hemispheres most consistent with chronic small vessel ischemic disease, mild-to-moderate in nature. Similar changes seen within the pons.  There is a 6 mm focus of restricted diffusion within the dorsal aspect of the left middle cerebellar peduncle, adjacent to the fourth ventricle (series 8, image 16). Finding consistent with a small acute ischemic infarct. No associated hemorrhage or significant mass effect. No other abnormal foci of restricted diffusion identified. Gray-white matter differentiation otherwise maintained. Normal intravascular flow voids are preserved.  No mass lesion, mass effect, or midline shift. Ventricles are normal in size without evidence of hydrocephalus. No extra-axial fluid collection.  Craniocervical junction within normal limits. Scattered degenerative changes noted within the upper cervical spine.  Incidental note made of an empty sella.  Orbits within normal limits. Scattered mucoperiosteal thickening present within the ethmoidal air cells and right maxillary sinus. Retention cyst present within the left maxillary sinus. No mastoid effusion. Inner ear structures normal.  Bone marrow signal intensity within normal limits. No scalp soft tissue abnormality.  IMPRESSION: 1. 6 mm acute ischemic infarct involving the dorsal aspect of the left middle cerebellar peduncle adjacent to the fourth ventricle. No associated hemorrhage or significant mass  effect. 2. No other acute intracranial process. 3. Mild to moderate chronic small vessel ischemic disease involving the supratentorial white matter and pons. 4. Empty sella.   Electronically Signed   By: Rise Mu M.D.   On: 04/21/2015 20:56     EKG Interpretation None      MDM   Final diagnoses:  Cerebellar infarct  Ataxic gait  Lightheadedness  Essential hypertension   Pt is a 59 yo M with hx of HTN, DM, CAD with stents who presents with 2 days of lightheadedness.   Reports he mowed his lawn yesterday then had acute onset of lightheadedness around 1400 when putting the mower up.  He had to drop down to one knee due to the severity of the lightheadedness, but didn't fall down/hit his head.  He reports continued lightheadedness with any position changes (lying/sitting/standing) but not with just head position changes (shaking head or eye movement, etc).  He felt like he had trouble ambulating due to lightheadness and had to use a cane to help balance.    Denies weakness, numbness, speech deficits, confusion, vision changes, or headache.   No nystagmus.  No reproducible lightheadedness on eye or head movement when he kept his overall position stable (ie stayed lying down or stayed sitting) but + lightheadedness when changing from lying to seated and from seated to standing positions.  Sx lasted for < 30 sec after position changes prior to resolving.   Full strength and sensation throughout.  Normal finger to nose and heel to shin, good rapidly alternating hand movements Unsteady when standing initially then able to steady himself and had a negative Romberg test.   When ambulating, patient subtly fell several times towards his left.  He was able to turn easily, had an overall benign gait, then would occasionally fall towards the left.   Due to his history and abnormal gait, will obtain a MRI brain to try to help evaluate for possible posterior circulation stroke.   MRI returned + for small left sided cerebellar stroke that appears acute.  Neurology consulted.  Will admit to hospitalist for further stroke work up.  Pt stable for the floor, to go to a tele bed    Patient was seen with ED Attending, Dr. Dallie Piles, MD   Lenell Antu, MD 04/22/15 1447  Eber Hong, MD 04/23/15 1044

## 2015-04-21 NOTE — H&P (Addendum)
[facility] History and Physical  William Gonzalez ZOX:[id number] DOB: June 06, 1956 DOA: 04/21/2015  Referring physician: ED physician PCP: PROVIDER NOT IN SYSTEM  Specialists:   Chief Complaint: dizziness  HPI: William Gonzalez is a 59 y.o. male with hx of HTN, DM, CAD with stents, tobacco abuse, alcohol abuse, who presents with dizziness.  Patient reports that his dizziness started yesterday. He states that he was mowing the lawn yesterday and started feeling lightheaded around 1400. He had to drop down to one knee due to the severity of the lightheadedness, but didn't fall down, No head injury. He reports continued lightheadedness with any position changes (lying/sitting/standing), but not with just head position changes (shaking head or eye movement, etc). Patient does not have vision change, hearing loss. No ear ringing.  ROS: currently patient denies fever, chills, fatigue, running nose, ear pain, headaches, cough, chest pain, SOB, abdominal pain, diarrhea, constipation, dysuria, urgency, frequency, hematuria, skin rashes, joint pain or leg swelling. No unilateral weakness, numbness or tingling sensations. No vision change or hearing loss.  In ED, patient was found to have 6 mm acute ischemic infarct involving the dorsal aspect of the left middle cerebellar peduncle adjacent to the fourth ventricle and empty sella. Troponin negative, INR 1.0, temperature normal, slightly bradycardia, electrolytes okay. Patient is admitted to inpatient for further evaluation and treatment. The neurology was consulted by ED.  Review of Systems: As presented in the history of presenting illness, rest negative.  Where does patient live?  At home Can patient participate in ADLs? Yes  Allergy:  Allergies  Allergen Reactions  . Iodine Anaphylaxis and Swelling    Swells all over    Past Medical History  Diagnosis Date  . Coronary artery disease   . Diabetes mellitus   . Hypertension   . Stented  coronary artery   . Tobacco abuse   . Alcohol abuse     Past Surgical History  Procedure Laterality Date  . Coronary stent placement      Social History:  reports that he has been smoking.  He does not have any smokeless tobacco history on file. He reports that he drinks alcohol. He reports that he uses illicit drugs (Cocaine).  Family History:  Family History  Problem Relation Age of Onset  . Hypertension Mother   . Hypertension Father   . Hypertension Sister   . Hypertension Brother   . Diabetes Mother   . Diabetes Father   . Diabetes Sister   . Diabetes Brother      Prior to Admission medications   Medication Sig Start Date End Date Taking? Authorizing Provider  amLODipine (NORVASC) 2.5 MG tablet Take 1 tablet (2.5 mg total) by mouth daily. 07/04/12   Paula LibraJohn Molpus, MD  aspirin EC 81 MG tablet Take 1 tablet (81 mg total) by mouth daily. 07/04/12   John Molpus, MD  clopidogrel (PLAVIX) 75 MG tablet Take 1 tablet (75 mg total) by mouth every morning. 07/04/12   John Molpus, MD  lisinopril-hydrochlorothiazide (PRINZIDE,ZESTORETIC) 20-25 MG per tablet Take 1 tablet by mouth daily. 07/04/12   John Molpus, MD  metFORMIN (GLUCOPHAGE) 1000 MG tablet Take 1 tablet (1,000 mg total) by mouth 2 (two) times daily with a meal. 07/04/12   Paula LibraJohn Molpus, MD  sertraline (ZOLOFT) 100 MG tablet Take 1 tablet (100 mg total) by mouth 2 (two) times daily. 07/04/12   Paula LibraJohn Molpus, MD    Physical Exam: Filed Vitals:   04/21/15 1815 04/21/15 1900 04/21/15 1901 04/21/15  2041  BP: 148/86 158/83  140/80  Pulse: 71 57  70  Temp:   97.8 F (36.6 C)   TempSrc:      Resp: Height:      Weight:      SpO2: 98% 98%  99%   General: Not in acute distress HEENT:       Eyes: PERRL, EOMI, no scleral icterus       ENT: No discharge from the ears and nose, no pharynx injection, no tonsillar enlargement.        Neck: No JVD, no bruit, no mass felt. Cardiac: S1/S2, RRR, No murmurs, No gallops or  rubs Pulm: Good air movement bilaterally. Clear to auscultation bilaterally. No rales, wheezing, rhonchi or rubs. Abd: Soft, nondistended, nontender, no rebound pain, no organomegaly, BS present Ext: No edema bilaterally. 2+DP/PT pulse bilaterally Musculoskeletal: No joint deformities, erythema, or stiffness, ROM full Skin: No rashes.  Neuro: Alert and oriented X3, cranial nerves II-XII grossly intact, muscle strength 5/5 in all extremeties, sensation to light touch intact. Brachial reflex 2+ bilaterally. Knee reflex 1+ bilaterally. Negative Babinski's sign.  Psych: Patient is not psychotic, no suicidal or hemocidal ideation.  Labs on Admission:  Basic Metabolic Panel:  Recent Labs Lab 04/21/15 1427  NA 137  K 4.0  CL 105  CO2 23  GLUCOSE 129*  BUN 7  CREATININE 0.85  CALCIUM 9.4   Liver Function Tests:  Recent Labs Lab 04/21/15 1427  AST 14  ALT 13  ALKPHOS 104  BILITOT 0.9  PROT 6.8  ALBUMIN 3.8   No results for input(s): LIPASE, AMYLASE in the last 168 hours. No results for input(s): AMMONIA in the last 168 hours. CBC:  Recent Labs Lab 04/21/15 1427  WBC 10.1  NEUTROABS 6.7  HGB 12.5*  HCT 37.3*  MCV 100.8*  PLT 341   Cardiac Enzymes: No results for input(s): CKTOTAL, CKMB, CKMBINDEX, TROPONINI in the last 168 hours.  BNP (last 3 results) No results for input(s): BNP in the last 8760 hours.  ProBNP (last 3 results) No results for input(s): PROBNP in the last 8760 hours.  CBG:  Recent Labs Lab 04/21/15 1856  GLUCAP 97    Radiological Exams on Admission: [name]  04/21/2015   CLINICAL DATA:  Initial evaluation for acute lightheadedness, ataxia, dizziness.  EXAM: MRI HEAD WITHOUT CONTRAST  TECHNIQUE: Multiplanar, multiecho pulse sequences of the brain and surrounding structures were obtained without intravenous contrast.  COMPARISON:  Prior CT from 03/06/2013.  FINDINGS: CSF volume within normal limits for patient age. Patchy and  confluent T2/FLAIR hyperintensity within the periventricular and deep white matter both cerebral hemispheres most consistent with chronic small vessel ischemic disease, mild-to-moderate in nature. Similar changes seen within the pons.  There is a 6 mm focus of restricted diffusion within the dorsal aspect of the left middle cerebellar peduncle, adjacent to the fourth ventricle (series 8, image 16). Finding consistent with a small acute ischemic infarct. No associated hemorrhage or significant mass effect. No other abnormal foci of restricted diffusion identified. Gray-white matter differentiation otherwise maintained. Normal intravascular flow voids are preserved.  No mass lesion, mass effect, or midline shift. Ventricles are normal in size without evidence of hydrocephalus. No extra-axial fluid collection.  Craniocervical junction within normal limits. Scattered degenerative changes noted within the upper cervical spine.  Incidental note made of an empty sella.  Orbits within normal limits. Scattered mucoperiosteal thickening present within the ethmoidal air cells and  right maxillary sinus. Retention cyst present within the left maxillary sinus. No mastoid effusion. Inner ear structures normal.  Bone marrow signal intensity within normal limits. No scalp soft tissue abnormality.  IMPRESSION: 1. 6 mm acute ischemic infarct involving the dorsal aspect of the left middle cerebellar peduncle adjacent to the fourth ventricle. No associated hemorrhage or significant mass effect. 2. No other acute intracranial process. 3. Mild to moderate chronic small vessel ischemic disease involving the supratentorial white matter and pons. 4. Empty sella.   Electronically Signed   By: Rise Mu M.D.   On: 04/21/2015 20:56    EKG: Independently reviewed.  Abnormal findings:  Deep T-wave inversion in inferiorly leads and ST elevation in V3-V4, which is slightly worse than previous EKG on 07/04/13.     Assessment/Plan Principal Problem:   Cerebellar infarct Active Problems:   Coronary artery disease   Hypertension   Tobacco abuse   Alcohol abuse   Stroke   Diabetes mellitus without complication  Cerebellar infarction: Patient's dizziness is most likely caused by cerebral infarction, as evidenced by MRI. Neurology was consulted. Dr. Amada Jupiter saw patient. - will admit to tele bed and do stroke work up - Risk factor modification: HgbA1c, fasting lipid panel  - MRA of the brain without contrast  - PT consult, OT consult, Speech consult  - 2 d Echocardiogram  - Ekg  - Carotid dopplers  - increase ASA from 81 to Aspirin 325 mg per day  - check UDS - start lipiotor 80 mg daily - follow up neurology's recommendation.  CAD: s/p of Stent. EKG showed deep T-wave inversion in inferiorly leads and ST elevation in V3-V4, which is slightly worse than previous EKG on 07/04/13. Patient does not have any chest pain or shortness of breath. Worsening EKGs is ikely secondary to stroke. - trop x 3 -ekg in am -ASA, metoprolol, Lipitor -Follow-up 2-D echo  Hypertension: -Hold Prinzide in the setting of acute stroke  Tobacco abuse and Alcohol abuse: -Did counseling about importance of quitting smoking and drinking alcohol -Nicotine patch -CIWA protocol  Diabetes mellitus: A1c was 7.0 on 05/28/10, well controlled. -Sliding-scale insulin -follow-up A1c  DVT ppx: SQ Heparin     Code Status: Full code Family Communication: None at bed side.       Disposition Plan: Admit to inpatient   Date of Service 04/21/2015    Lorretta Harp [facility] Pager [phone]  If 7PM-7AM, please contact night-coverage [email or web address] Password [employer] 04/21/2015, 10:31 PM

## 2015-04-21 NOTE — ED Notes (Signed)
MRI called and states pt refusing MRI scan due to dizziness and feeling anxious, Dr. Delford FieldWright notified and order for ativan written. Pt given ativan in MRI.

## 2015-04-21 NOTE — ED Notes (Signed)
Neuro at bedside.

## 2015-04-21 NOTE — Consult Note (Signed)
Neurology Consultation Reason for Consult: Stroke Referring Physician: Jarvis MorganNiu, X  CC: Stroke  History is obtained from: Patient  HPI: William Gonzalez is a 59 y.o. male he was in his normal state of health until yesterday at which time he noticed that he became dizzy and started having trouble walking. He has not noticed any numbness or weakness. He has not noticed any specific areas of clumsiness in his arms or legs, but has noticed that he is having difficulty walking. He is able to with the assistance of a cane or some other walking aid.   LKW: 4/27 tpa given?: no, outside of window    ROS: A 14 point ROS was performed and is negative except as noted in the HPI.   Past Medical History  Diagnosis Date  . Coronary artery disease   . Diabetes mellitus   . Hypertension   . Stented coronary artery   . Tobacco abuse   . Alcohol abuse     Family History: Diabetes and hypertension  Social History: Tob: Current smoker, patient counseled to quit  Exam: Current vital signs: BP 162/67 mmHg  Pulse 64  Temp(Src) 98.8 F (37.1 C) (Oral)  Resp 19  Ht 5\' 6"  (1.676 m)  Wt 74.844 kg (165 lb)  BMI 26.64 kg/m2  SpO2 96% Vital signs in last 24 hours: Temp:  [97.8 F (36.6 C)-98.8 F (37.1 C)] 98.8 F (37.1 C) (04/28 2301) Pulse Rate:  [57-72] 64 (04/28 2301) Resp:  [11-21] 19 (04/28 2200) BP: (136-165)/(54-116) 162/67 mmHg (04/28 2301) SpO2:  [96 %-99 %] 96 % (04/28 2301) Weight:  [74.844 kg (165 lb)] 74.844 kg (165 lb) (04/28 1418)   Physical Exam  Constitutional: Appears well-developed and well-nourished.  Psych: Affect appropriate to situation Eyes: No scleral injection HENT: No OP obstrucion Head: Normocephalic.  Cardiovascular: Normal rate and regular rhythm.  Respiratory: Effort normal and breath sounds normal to anterior ascultation GI: Soft.  No distension. There is no tenderness.  Skin: WDI  Neuro: Mental Status: Patient is awake, alert, oriented to person,  place, month, year, and situation. Patient is able to give a clear and coherent history. No signs of aphasia or neglect Cranial Nerves: II: Visual Fields are full. Pupils are equal, round, and reactive to light.   III,IV, VI: EOMI without ptosis or diploplia.  V: Facial sensation is symmetric to temperature VII: Facial movement is symmetric.  VIII: hearing is intact to voice X: Uvula elevates symmetrically XI: Shoulder shrug is symmetric. XII: tongue is midline without atrophy or fasciculations.  Motor: Tone is normal. Bulk is normal. 5/5 strength was present in all four extremities.  Sensory: Sensation is symmetric to light touch and temperature in the arms and legs. Cerebellar: FNF with prominent dysmetria on the left, intact on the right. HKS intact bilaterally         I have reviewed labs in epic and the results pertinent to this consultation are: CMP-unremarkable  I have reviewed the images obtained: MRI brain-left middle cerebellar peduncle stroke  Impression: 59 year old male with acute middle cerebellar peduncle stroke. He'll need a workup for etiology.  Recommendations: 1. HgbA1c, fasting lipid panel 2. MRI, MRA  of the brain without contrast 3. Frequent neuro checks 4. Echocardiogram 5. Carotid dopplers 6. Prophylactic therapy-Antiplatelet med: Aspirin - dose 325mg  PO or 300mg  PR 7. Risk factor modification 8. Telemetry monitoring 9. PT consult, OT consult, Speech consult   Ritta SlotMcNeill Kirkpatrick, MD [facility] [phone]  If 7pm- 7am, please page neurology  on call as listed in [place].

## 2015-04-21 NOTE — ED Notes (Signed)
PT unable to provide UA at this time, stated that he hasn't drank anything since yesterday. RN notified.

## 2015-04-21 NOTE — ED Provider Notes (Signed)
[age] year old male who presents with positional dizziness, states that when he stands up he becomes lightheaded, ambulating the patient tends to drift to one side, in a supine position he has normal heart and lung sounds, normal finger nose finger, normal grips, normal strength in all 4 extremities, EKG shows T-wave inversions in the inferior and the lateral precordial leads however compared to 2013 his EKG is completely unchanged.  MRI shows acute cerebellar infarct, patient will need admission to the hospital  I saw and evaluated the patient, reviewed the resident's note and I agree with the findings and plan. ED ECG REPORT  I personally interpreted this EKG   Date: 04/23/2015   Rate: 66  Rhythm: normal sinus rhythm  QRS Axis: left  Intervals: normal  ST/T Wave abnormalities: T wave inversions in inferior and lateral precordial leads  Conduction Disutrbances:none  Narrative Interpretation:   Old EKG Reviewed: unchanged from 7/13  I personally interpreted the EKG as well as the resident and agree with the interpretation on the resident's chart.  Final diagnoses:  Cerebellar infarct  Ataxic gait  Lightheadedness  Essential hypertension      Eber HongBrian Tonica Brasington, MD 04/23/15 1044

## 2015-04-21 NOTE — ED Notes (Signed)
Pt sts dizziness worse with position change; pt sts started yesterday and feels like may have dental infection with drainage on right upper side

## 2015-04-22 ENCOUNTER — Inpatient Hospital Stay (HOSPITAL_COMMUNITY): Payer: Non-veteran care

## 2015-04-22 DIAGNOSIS — I6789 Other cerebrovascular disease: Secondary | ICD-10-CM

## 2015-04-22 DIAGNOSIS — R260 Ataxic gait: Secondary | ICD-10-CM

## 2015-04-22 DIAGNOSIS — E119 Type 2 diabetes mellitus without complications: Secondary | ICD-10-CM

## 2015-04-22 DIAGNOSIS — F1410 Cocaine abuse, uncomplicated: Secondary | ICD-10-CM | POA: Insufficient documentation

## 2015-04-22 LAB — URINALYSIS, ROUTINE W REFLEX MICROSCOPIC
Bilirubin Urine: NEGATIVE
Glucose, UA: 250 mg/dL — AB
HGB URINE DIPSTICK: NEGATIVE
Ketones, ur: 15 mg/dL — AB
Leukocytes, UA: NEGATIVE
NITRITE: NEGATIVE
PH: 5 (ref 5.0–8.0)
Protein, ur: 30 mg/dL — AB
Specific Gravity, Urine: 1.021 (ref 1.005–1.030)
UROBILINOGEN UA: 1 mg/dL (ref 0.0–1.0)

## 2015-04-22 LAB — LIPID PANEL
CHOLESTEROL: 160 mg/dL (ref 0–200)
HDL: 53 mg/dL (ref >39)
LDL CALC: 88 mg/dL (ref 0–99)
TRIGLYCERIDES: 93 mg/dL (ref <150)
Total CHOL/HDL Ratio: 3 RATIO
VLDL: 19 mg/dL (ref 0–40)

## 2015-04-22 LAB — TROPONIN I
Troponin I: <0.03 ng/mL (ref <0.031)
Troponin I: <0.03 ng/mL (ref <0.031)

## 2015-04-22 LAB — GLUCOSE, CAPILLARY
GLUCOSE-CAPILLARY: 238 mg/dL — AB (ref 70–99)
GLUCOSE-CAPILLARY: 180 mg/dL — AB (ref 70–99)
GLUCOSE-CAPILLARY: 206 mg/dL — AB (ref 70–99)
Glucose-Capillary: 139 mg/dL — ABNORMAL HIGH (ref 70–99)
Glucose-Capillary: 264 mg/dL — ABNORMAL HIGH (ref 70–99)

## 2015-04-22 LAB — URINE MICROSCOPIC-ADD ON

## 2015-04-22 LAB — RAPID URINE DRUG SCREEN, HOSP PERFORMED
AMPHETAMINES: NOT DETECTED
Barbiturates: NOT DETECTED
Benzodiazepines: NOT DETECTED
Cocaine: POSITIVE — AB
Opiates: NOT DETECTED
TETRAHYDROCANNABINOL: NOT DETECTED

## 2015-04-22 MED ORDER — INSULIN ASPART 100 UNIT/ML ~~LOC~~ SOLN
0.0000 [IU] | Freq: Every day | SUBCUTANEOUS | Status: DC
  Administered 2015-04-22: 3 [IU] via SUBCUTANEOUS
  Administered 2015-04-22: 2 [IU] via SUBCUTANEOUS

## 2015-04-22 MED ORDER — INSULIN ASPART 100 UNIT/ML ~~LOC~~ SOLN
0.0000 [IU] | Freq: Three times a day (TID) | SUBCUTANEOUS | Status: DC
  Administered 2015-04-22: 2 [IU] via SUBCUTANEOUS
  Administered 2015-04-22: 1 [IU] via SUBCUTANEOUS
  Administered 2015-04-22: 3 [IU] via SUBCUTANEOUS
  Administered 2015-04-23 (×2): 2 [IU] via SUBCUTANEOUS
  Administered 2015-04-23: 1 [IU] via SUBCUTANEOUS
  Administered 2015-04-24: 2 [IU] via SUBCUTANEOUS

## 2015-04-22 NOTE — Evaluation (Signed)
Physical Therapy Evaluation Patient Details Name: JULION GATT MRN: [id number] DOB: [birth year] Today's Date: 04/22/2015   History of Present Illness  Patient is a 59 yo male admitted 04/21/15 with dizziness and difficulty with ambulation.  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  PMH:  CAD, DM, HTN, tobacco abuse, polysubstance abuse.  Clinical Impression  Patient is modified independent with all mobility and gait.  Slightly unsteady during one high level activity - no loss of balance.  Patient reports he has been using a "stick" at home for ambulation.  Recommend single point cane for home use.  No other acute PT needs identified - PT will sign off.  Encouraged ambulation in hallway with nursing.    Follow Up Recommendations No PT follow up;Supervision - Intermittent    Equipment Recommendations  Cane    Recommendations for Other Services       Precautions / Restrictions Precautions Precautions: None Restrictions Weight Bearing Restrictions: No      Mobility  Bed Mobility Overal bed mobility: Independent                Transfers Overall transfer level: Independent Equipment used: None                Ambulation/Gait Ambulation/Gait assistance: Modified independent (Device/Increase time) Ambulation Distance (Feet): 200 Feet Assistive device: None Gait Pattern/deviations: Step-through pattern;Decreased stride length   Gait velocity interpretation: at or above normal speed for age/gender General Gait Details: Patient demonstrates good gait pattern and speed.  No loss of balance noted.  Patient reports he feels "unbalanced".  Stairs            Wheelchair Mobility    Modified Rankin (Stroke Patients Only) Modified Rankin (Stroke Patients Only) Pre-Morbid Rankin Score: No symptoms Modified Rankin: No symptoms     Balance Overall balance assessment: Independent                       Rhomberg - Eyes Opened: 30 Rhomberg - Eyes  Closed: 30 (minimal sway) High level balance activites: Turns;Direction changes;Sudden stops;Head turns High Level Balance Comments: No loss of balance during high level balance activities.  Unsteadiness noted with vertical head movements.             Pertinent Vitals/Pain Pain Assessment: No/denies pain    Home Living Family/patient expects to be discharged to:: Private residence Living Arrangements: Other (Comment) (2 housemates) Available Help at Discharge: Available 24 hours/day (Housemates available 24 hours per patient) Type of Home: House Home Access: Level entry     Home Layout: One level Home Equipment: None      Prior Function Level of Independence: Independent         Comments: Does not drive.     Hand Dominance        Extremity/Trunk Assessment   Upper Extremity Assessment: Overall WFL for tasks assessed           Lower Extremity Assessment: Overall WFL for tasks assessed         Communication   Communication: No difficulties  Cognition Arousal/Alertness: Awake/alert Behavior During Therapy: WFL for tasks assessed/performed Overall Cognitive Status: Within Functional Limits for tasks assessed                      General Comments      Exercises        Assessment/Plan    PT Assessment Patent does not need any further PT services  PT  Diagnosis Abnormality of gait   PT Problem List    PT Treatment Interventions     PT Goals (Current goals can be found in the Care Plan section) Acute Rehab PT Goals PT Goal Formulation: All assessment and education complete, DC therapy    Frequency     Barriers to discharge        Co-evaluation               End of Session Equipment Utilized During Treatment: Gait belt Activity Tolerance: Patient tolerated treatment well Patient left: in bed;with call bell/phone within reach Nurse Communication: Mobility status         Time: 1534-1550 PT Time Calculation (min) (ACUTE  ONLY): 16 min   Charges:   PT Evaluation $Initial PT Evaluation Tier I: 1 Procedure     PT G CodesVena Austria:        Claude Swendsen H 04/22/2015, 4:08 PM Durenda HurtSusan H. Renaldo Fiddleravis, PT, [facility] Acute Rehab Services Pager [phone]

## 2015-04-22 NOTE — Progress Notes (Signed)
04/22/2015 1120 Chart reviewed. Utilization review complete. Isidoro DonningAlesia Rykker Coviello RN Case Mgmt [phone]

## 2015-04-22 NOTE — Evaluation (Signed)
Speech Language Pathology Evaluation Patient Details Name: Pearline CablesLarry J Roettger MRN: [id number] DOB: Aug 15, 1956 Today's Date: 04/22/2015 Time: [id number] SLP Time Calculation (min) (ACUTE ONLY): 15 min  Problem List:  Patient Active Problem List   Diagnosis Date Noted  . Cerebellar infarct 04/21/2015  . Tobacco abuse 04/21/2015  . Alcohol abuse 04/21/2015  . Stroke 04/21/2015  . Diabetes mellitus without complication 04/21/2015  . Coronary artery disease   . Hypertension    Past Medical History:  Past Medical History  Diagnosis Date  . Coronary artery disease   . Diabetes mellitus   . Hypertension   . Stented coronary artery   . Tobacco abuse   . Alcohol abuse    Past Surgical History:  Past Surgical History  Procedure Laterality Date  . Coronary stent placement     HPI:  59 year old male admitted with lightheadedness, dizziness, found to have acute middle cerebellar peduncle stroke   Assessment / Plan / Recommendation Clinical Impression  Pt's speech, language, and cognition are within functional limits per assessment.  No needs identified- SLP to sign off.     SLP Assessment  Patient does not need any further Speech Lanaguage Pathology Services    Follow Up Recommendations  None     SLP Evaluation Prior Functioning  Cognitive/Linguistic Baseline: Within functional limits Type of Home: House (one story)  Lives With: Family Available Help at Discharge: Family Vocation: On disability   Cognition  Overall Cognitive Status: Within Functional Limits for tasks assessed Arousal/Alertness: Awake/alert Orientation Level: Oriented X4 Attention: Selective Selective Attention: Appears intact Memory: Appears intact Awareness: Appears intact Problem Solving: Appears intact    Comprehension  Auditory Comprehension Overall Auditory Comprehension: Appears within functional limits for tasks assessed Visual Recognition/Discrimination Discrimination: Within Function  Limits Reading Comprehension Reading Status: Not tested    Expression Expression Primary Mode of Expression: Verbal Verbal Expression Overall Verbal Expression: Appears within functional limits for tasks assessed Initiation: No impairment Written Expression Written Expression: Not tested   Oral / Motor Oral Motor/Sensory Function Overall Oral Motor/Sensory Function: Appears within functional limits for tasks assessed Motor Speech Overall Motor Speech: Appears within functional limits for tasks assessed   GO     Blenda MountsCouture, Madisan Bice Laurice 04/22/2015, 3:29 PM

## 2015-04-22 NOTE — Progress Notes (Signed)
[facility] PROGRESS NOTE  William CablesLarry J Gonzalez [id number] DOB: 06-03-1956 DOA: 04/21/2015 PCP: PROVIDER NOT IN SYSTEM  Assessment/Plan: Principal Problem:   Cerebellar infarct - Neurology on board and managing - Patient undergoing routine stroke workup - Patient currently on aspirin  Active Problems:   Coronary artery disease - No chest pain reported today. - Troponins 3 negative    Hypertension - Holding blood pressure medications in lieu of principal problem.    Tobacco abuse - We'll continue to encourage cessation    Alcohol abuse - Ativan on board for when necessary use    Diabetes mellitus without complication - Stable, continue current insulin regimen  Code Status: full Family Communication: No family at bedside Disposition Plan: Pending further workup   Consultants:  Neurology  Procedures:  None  Antibiotics:  None  HPI/Subjective: Patient has no new complaints.  Objective: Filed Vitals:   04/22/15 1336  BP: 153/83  Pulse: 67  Temp: 98.7 F (37.1 C)  Resp: 18    Intake/Output Summary (Last 24 hours) at 04/22/15 1556 Last data filed at 04/22/15 1333  Gross per 24 hour  Intake    476 ml  Output      0 ml  Net    476 ml   Filed Weights   04/21/15 1418  Weight: 74.844 kg (165 lb)    Exam:   General:  Patient in no acute distress, alert and awake  Cardiovascular: Regular rate and rhythm, no murmurs or rubs  Respiratory: Clear to auscultation bilaterally, no wheezes, no increased work of breathing  Abdomen: Soft, nondistended, nontender  Musculoskeletal: No cyanosis or clubbing   Data Reviewed: Basic Metabolic Panel:  Recent Labs Lab 04/21/15 1427  NA 137  K 4.0  CL 105  CO2 23  GLUCOSE 129*  BUN 7  CREATININE 0.85  CALCIUM 9.4   Liver Function Tests:  Recent Labs Lab 04/21/15 1427  AST 14  ALT 13  ALKPHOS 104  BILITOT 0.9  PROT 6.8  ALBUMIN 3.8   No results for input(s): LIPASE, AMYLASE in the last  168 hours. No results for input(s): AMMONIA in the last 168 hours. CBC:  Recent Labs Lab 04/21/15 1427  WBC 10.1  NEUTROABS 6.7  HGB 12.5*  HCT 37.3*  MCV 100.8*  PLT 341   Cardiac Enzymes:  Recent Labs Lab 04/22/15 0008 04/22/15 0428 04/22/15 1135  TROPONINI <0.03 <0.03 <0.03   BNP (last 3 results) No results for input(s): BNP in the last 8760 hours.  ProBNP (last 3 results) No results for input(s): PROBNP in the last 8760 hours.  CBG:  Recent Labs Lab 04/21/15 1856 04/21/15 2215 04/22/15 0021 04/22/15 0808 04/22/15 1104  GLUCAP 97 90 264* 139* 238*    No results found for this or any previous visit (from the past 240 hour(s)).   Studies: [name]  04/21/2015   CLINICAL DATA:  Initial evaluation for acute lightheadedness, ataxia, dizziness.  EXAM: MRI HEAD WITHOUT CONTRAST  TECHNIQUE: Multiplanar, multiecho pulse sequences of the brain and surrounding structures were obtained without intravenous contrast.  COMPARISON:  Prior CT from 03/06/2013.  FINDINGS: CSF volume within normal limits for patient age. Patchy and confluent T2/FLAIR hyperintensity within the periventricular and deep white matter both cerebral hemispheres most consistent with chronic small vessel ischemic disease, mild-to-moderate in nature. Similar changes seen within the pons.  There is a 6 mm focus of restricted diffusion within the dorsal aspect of the left middle cerebellar peduncle, adjacent to the  fourth ventricle (series 8, image 16). Finding consistent with a small acute ischemic infarct. No associated hemorrhage or significant mass effect. No other abnormal foci of restricted diffusion identified. Gray-white matter differentiation otherwise maintained. Normal intravascular flow voids are preserved.  No mass lesion, mass effect, or midline shift. Ventricles are normal in size without evidence of hydrocephalus. No extra-axial fluid collection.  Craniocervical junction within normal  limits. Scattered degenerative changes noted within the upper cervical spine.  Incidental note made of an empty sella.  Orbits within normal limits. Scattered mucoperiosteal thickening present within the ethmoidal air cells and right maxillary sinus. Retention cyst present within the left maxillary sinus. No mastoid effusion. Inner ear structures normal.  Bone marrow signal intensity within normal limits. No scalp soft tissue abnormality.  IMPRESSION: 1. 6 mm acute ischemic infarct involving the dorsal aspect of the left middle cerebellar peduncle adjacent to the fourth ventricle. No associated hemorrhage or significant mass effect. 2. No other acute intracranial process. 3. Mild to moderate chronic small vessel ischemic disease involving the supratentorial white matter and pons. 4. Empty sella.   Electronically Signed   By: Rise Mu M.D.   On: 04/21/2015 20:56   Mr Maxine Glenn Head/brain Wo Cm  04/22/2015   CLINICAL DATA:  Sudden onset of dizziness yesterday. Punctate infarct within the left middle cerebellar peduncle.  EXAM: MRA HEAD WITHOUT CONTRAST  TECHNIQUE: Angiographic images of the Circle of Willis were obtained using MRA technique without intravenous contrast.  COMPARISON:  None.  FINDINGS: Internal carotid arteries are within normal limits from the high cervical segments through the ICA termini. The A1 and M1 segments are normal. Anterior communicating artery is patent. The MCA bifurcations are within normal limits. ACA and MCA branch vessels are within normal limits.  The right vertebral artery is the dominant vessel. The right PICA origin is visualized and normal. 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 The distal left PCA is small. The basilar artery is within normal limits. The right posterior cerebral artery is of fetal type. There is mild attenuation of distal PCA branch vessels.  IMPRESSION: 1. Mild distal small vessel disease, more prominent to the posterior circulation.  2. 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 3. No significant proximal stenosis, aneurysm, or branch vessel occlusion otherwise.   Electronically Signed   By: Marin Roberts M.D.   On: 04/22/2015 08:15    Scheduled Meds: . aspirin  300 mg Rectal Daily   Or  . aspirin  325 mg Oral Daily  . atorvastatin  80 mg Oral q1800  . folic acid  1 mg Oral Daily  . heparin  5,000 Units Subcutaneous 3 times per day  . insulin aspart  0-5 Units Subcutaneous QHS  . insulin aspart  0-9 Units Subcutaneous TID WC  . LORazepam  0-4 mg Intravenous Q6H   Followed by  . [START ON 04/24/2015] LORazepam  0-4 mg Intravenous Q12H  . metoprolol tartrate  12.5 mg Oral BID  . nicotine  21 mg Transdermal Daily  . pantoprazole  40 mg Oral Daily  . thiamine  100 mg Oral Daily   Or  . thiamine  100 mg Intravenous Daily   Continuous Infusions:   Take that course of that guyn't Time spent: > 35 minutes    Penny Pia  [facility] Pager [phone] If 7PM-7AM, please contact night-coverage at [email or web address], password [email or web address] 04/22/2015, 3:56 PM  LOS: 1 day

## 2015-04-22 NOTE — Progress Notes (Signed)
Echocardiogram 2D Echocardiogram has been performed.  Shaneece Stockburger 04/22/2015, 10:45 AM

## 2015-04-22 NOTE — Progress Notes (Addendum)
PT Cancellation Note  Patient Details Name: William Gonzalez MRN: [id number] DOB: 12-Feb-1956   Cancelled Treatment:    Reason Eval/Treat Not Completed: Medical issues which prohibited therapy.  Patient remains on bedrest per orders.   MD:  Please write activity orders when appropriate for patient.  PT will initiate evaluation at that time.  Thank you!   Vena AustriaDavis, Joseangel Nettleton H 04/22/2015, 12:00 PM Durenda HurtSusan H. Renaldo Fiddleravis, PT, [facility] Acute Rehab Services Pager [phone]

## 2015-04-22 NOTE — Progress Notes (Signed)
STROKE TEAM PROGRESS NOTE   HISTORY William Gonzalez is a 59 y.o. male he was in his normal state of health until yesterday at which time he noticed that he became dizzy and started having trouble walking. He has not noticed any numbness or weakness. He has not noticed any specific areas of clumsiness in his arms or legs, but has noticed that he is having difficulty walking. He is able to with the assistance of a cane or some other walking aid.  LKW: 4/27 tpa given?: no, outside of windo  SUBJECTIVE (INTERVAL HISTORY) No family members present. The patient is without complaints. He admitted to use cocaine 2 days ago.   OBJECTIVE Temp:  [97.8 F (36.6 C)-98.8 F (37.1 C)] 98 F (36.7 C) (04/29 0654) Pulse Rate:  [57-99] 64 (04/29 0654) Cardiac Rhythm:  [-] Normal sinus rhythm (04/29 0100) Resp:  [11-21] 14 (04/29 0654) BP: (136-165)/(54-116) 148/68 mmHg (04/29 0654) SpO2:  [96 %-100 %] 100 % (04/29 0654) Weight:  [74.844 kg (165 lb)] 74.844 kg (165 lb) (04/28 1418)   Recent Labs Lab 04/21/15 1856 04/21/15 2215 04/22/15 0021  GLUCAP 97 90 264*    Recent Labs Lab 04/21/15 1427  NA 137  K 4.0  CL 105  CO2 23  GLUCOSE 129*  BUN 7  CREATININE 0.85  CALCIUM 9.4    Recent Labs Lab 04/21/15 1427  AST 14  ALT 13  ALKPHOS 104  BILITOT 0.9  PROT 6.8  ALBUMIN 3.8    Recent Labs Lab 04/21/15 1427  WBC 10.1  NEUTROABS 6.7  HGB 12.5*  HCT 37.3*  MCV 100.8*  PLT 341    Recent Labs Lab 04/22/15 0008 04/22/15 0428  TROPONINI <0.03 <0.03    Recent Labs  04/21/15 1848  LABPROT 13.3  INR 1.00    Recent Labs  04/22/15 0050  COLORURINE AMBER*  LABSPEC 1.021  PHURINE 5.0  GLUCOSEU 250*  HGBUR NEGATIVE  BILIRUBINUR NEGATIVE  KETONESUR 15*  PROTEINUR 30*  UROBILINOGEN 1.0  NITRITE NEGATIVE  LEUKOCYTESUR NEGATIVE       Component Value Date/Time   CHOL 160 04/22/2015 0428   TRIG 93 04/22/2015 0428   HDL 53 04/22/2015 0428   CHOLHDL 3.0  04/22/2015 0428   VLDL 19 04/22/2015 0428   LDLCALC 88 04/22/2015 0428   Lab Results  Component Value Date   HGBA1C * 05/28/2010    7.6 (NOTE)                                                                       According to the ADA Clinical Practice Recommendations for 2011, when HbA1c is used as a screening test:   >=6.5%   Diagnostic of Diabetes Mellitus           (if abnormal result  is confirmed)  5.7-6.4%   Increased risk of developing Diabetes Mellitus  References:Diagnosis and Classification of Diabetes Mellitus,Diabetes Care,2011,34(Suppl 1):S62-S69 and Standards of Medical Care in         Diabetes - 2011,Diabetes Care,2011,34  (Suppl 1):S11-S61.      Component Value Date/Time   LABOPIA NONE DETECTED 04/22/2015 0050   COCAINSCRNUR POSITIVE* 04/22/2015 0050   LABBENZ NONE DETECTED 04/22/2015 0050   AMPHETMU NONE DETECTED  04/22/2015 0050   THCU NONE DETECTED 04/22/2015 0050   LABBARB NONE DETECTED 04/22/2015 0050     Recent Labs Lab 04/21/15 1848  ETH <5   I have personally reviewed the radiological images below and agree with the radiology interpretations.  [name]  04/21/2015    1. 6 mm acute ischemic infarct involving the dorsal aspect of the left middle cerebellar peduncle adjacent to the fourth ventricle. No associated hemorrhage or significant mass effect.  2. No other acute intracranial process.  3. Mild to moderate chronic small vessel ischemic disease involving the supratentorial white matter and pons.  4. Empty sella.     MRA of the Brain 04/22/2015 1. Mild distal small vessel disease, more prominent to the posterior circulation. 2. 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 3. No significant proximal stenosis, aneurysm, or branch vessel occlusion otherwise.  CUS - pending  2D echo - pending  PHYSICAL EXAM  Temp:  [97.8 F (36.6 C)-98.7 F (37.1 C)] 98.3 F (36.8 C) (04/29 2007) Pulse Rate:  [61-99] 69 (04/29  2140) Resp:  [12-20] 20 (04/29 2007) BP: (137-159)/(58-83) 152/77 mmHg (04/29 2140) SpO2:  [96 %-100 %] 97 % (04/29 2140)  General - Well nourished, well developed, in no apparent distress.  Ophthalmologic - Sharp disc margins OU.  Cardiovascular - Regular rate and rhythm with no murmur.  Mental Status -  Level of arousal and orientation to time, place, and person were intact. Language including expression, naming, repetition, comprehension was assessed and found intact. Fund of Knowledge was assessed and was intact.  Cranial Nerves II - XII - II - Visual field intact OU. III, IV, VI - Extraocular movements intact. V - Facial sensation intact bilaterally. VII - Facial movement intact bilaterally. VIII - Hearing & vestibular intact bilaterally. X - Palate elevates symmetrically. XI - Chin turning & shoulder shrug intact bilaterally. XII - Tongue protrusion intact.  Motor Strength - The patient's strength was normal in all extremities and pronator drift was absent.  Bulk was normal and fasciculations were absent.   Motor Tone - Muscle tone was assessed at the neck and appendages and was normal.  Reflexes - The patient's reflexes were 1+ in all extremities and he had no pathological reflexes.  Sensory - Light touch, temperature/pinprick were assessed and were symmetrical.    Coordination - The patient had normal movements in the hands and feet with no ataxia or dysmetria.  Tremor was absent.  Gait and Station - deferred   ASSESSMENT/PLAN [name] is a 59 y.o. male with history of coronary artery disease, tobacco use, substance abuse including cocaine and alcohol, diabetes mellitus, and hypertension, presenting with dizziness and difficulty walking.  He did not receive IV t-PA due to late presentation.   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  Resultant - dizziness  MRI  as above  MRA  as  above  Carotid Doppler  pending  2D Echo pending  LDL 88  HgbA1c pending  Subcutaneous heparin for VTE prophylaxis  Diet heart healthy/carb modified Room service appropriate?: Yes; Fluid consistency:: Thin  aspirin 81 mg orally every day and clopidogrel 75 mg orally every day prior to admission, now on aspirin 325 mg orally every day  Ongoing aggressive stroke risk factor management  Therapy recommendations: No follow-up physical therapy  Disposition:  Pending  Hypertension  Home meds: Lopressor, Norvasc, and lisinopril  Stable  Hyperlipidemia  Home meds: No lipid lowering medications prior to admission.  LDL 88, goal < 70  Now on Lipitor 80 mg daily  Continue statin at discharge  Diabetes  HgbA1c pending, goal < 7.0  Uncontrolled  Tobacco abuse  Current smoker  Smoking cessation counseling provided  Pt is willing to quit  Cocaine abuse  UDS positive for cocaine  Pt admitted cocaine use recently  Cocaine cessation counseling provided  Pt is willing to quit  Other Stroke Risk Factors  Advanced age  ETOH use  Coronary artery disease  Other Active Problems    Other Pertinent History  Iodine allergy  Hospital day # 1  Delton See PA-C [facility] Pager [phone] 04/22/2015, 4:59 PM  I, the attending vascular neurologist, have personally obtained a history, examined the patient, evaluated laboratory data, individually viewed imaging studies and agree with radiology interpretations.  Together with the NP/PA, we formulated the assessment and plan of care which reflects our mutual decision.  I have made any additions or clarifications directly to the above note and agree with the findings and plan as currently documented.   59 yo M with hx of CAD s/p stent, HTN, DM, smoker was admitted for left cerebellar peduncle punctate infarct. Stroke w/u CUS and 2D echo still pending. Recommend to continue ASA and statin. Quit  smoking and cocaine.   Marvel Plan, MD PhD Stroke Neurology 04/22/2015 11:09 PM       To contact Stroke Continuity provider, please refer to [email or web address]. After hours, contact General Neurology

## 2015-04-22 NOTE — Plan of Care (Signed)
Problem: Progression Outcomes Goal: Initial discharge plan initiated Outcome: Completed/Met Date Met:  04/22/15 To return home with cousin

## 2015-04-23 LAB — GLUCOSE, CAPILLARY
GLUCOSE-CAPILLARY: 150 mg/dL — AB (ref 70–99)
GLUCOSE-CAPILLARY: 174 mg/dL — AB (ref 70–99)
GLUCOSE-CAPILLARY: 200 mg/dL — AB (ref 70–99)
Glucose-Capillary: 182 mg/dL — ABNORMAL HIGH (ref 70–99)

## 2015-04-23 LAB — HEMOGLOBIN A1C
Hgb A1c MFr Bld: 7.3 % — ABNORMAL HIGH (ref 4.8–5.6)
Mean Plasma Glucose: 163 mg/dL

## 2015-04-23 MED ORDER — ACETAMINOPHEN 325 MG PO TABS: 650.0000 mg | ORAL_TABLET | Freq: Four times a day (QID) | ORAL | Status: DC | PRN

## 2015-04-23 MED ORDER — ACETAMINOPHEN 325 MG PO TABS
650.0000 mg | ORAL_TABLET | Freq: Once | ORAL | Status: AC
  Administered 2015-04-23: 650 mg via ORAL
  Filled 2015-04-23: qty 2

## 2015-04-23 NOTE — Progress Notes (Signed)
VASCULAR LAB PRELIMINARY  PRELIMINARY  PRELIMINARY  PRELIMINARY  Carotid duplex  completed.    Preliminary report:  Bilateral:  1-39% ICA stenosis.  Vertebral artery flow is antegrade.      Wister Hoefle, RVT 04/23/2015, 10:03 AM

## 2015-04-23 NOTE — Progress Notes (Signed)
STROKE TEAM PROGRESS NOTE   HISTORY William CablesLarry J Gonzalez is a 59 y.o. male he was in his normal state of health until yesterday at which time he noticed that he became dizzy and started having trouble walking. He has not noticed any numbness or weakness. He has not noticed any specific areas of clumsiness in his arms or legs, but has noticed that he is having difficulty walking. He is able to with the assistance of a cane or some other walking aid.  LKW: 4/27 tpa given?: no, outside of windo  SUBJECTIVE (INTERVAL HISTORY) Dizziness improved. Pt states close to baseline.    OBJECTIVE Temp:  [97.2 F (36.2 C)-98.7 F (37.1 C)] 98.3 F (36.8 C) (04/30 1021) Pulse Rate:  [55-69] 68 (04/30 1021) Cardiac Rhythm:  [-] Normal sinus rhythm (04/30 0918) Resp:  [16-20] 18 (04/30 1021) BP: (126-153)/(66-83) 153/72 mmHg (04/30 1021) SpO2:  [94 %-98 %] 98 % (04/30 1021)   Recent Labs Lab 04/22/15 1104 04/22/15 1645 04/22/15 2104 04/23/15 0800 04/23/15 1105  GLUCAP 238* 180* 206* 150* 182*    Recent Labs Lab 04/21/15 1427  NA 137  K 4.0  CL 105  CO2 23  GLUCOSE 129*  BUN 7  CREATININE 0.85  CALCIUM 9.4    Recent Labs Lab 04/21/15 1427  AST 14  ALT 13  ALKPHOS 104  BILITOT 0.9  PROT 6.8  ALBUMIN 3.8    Recent Labs Lab 04/21/15 1427  WBC 10.1  NEUTROABS 6.7  HGB 12.5*  HCT 37.3*  MCV 100.8*  PLT 341    Recent Labs Lab 04/22/15 0008 04/22/15 0428 04/22/15 1135  TROPONINI <0.03 <0.03 <0.03    Recent Labs  04/21/15 1848  LABPROT 13.3  INR 1.00    Recent Labs  04/22/15 0050  COLORURINE AMBER*  LABSPEC 1.021  PHURINE 5.0  GLUCOSEU 250*  HGBUR NEGATIVE  BILIRUBINUR NEGATIVE  KETONESUR 15*  PROTEINUR 30*  UROBILINOGEN 1.0  NITRITE NEGATIVE  LEUKOCYTESUR NEGATIVE       Component Value Date/Time   CHOL 160 04/22/2015 0428   TRIG 93 04/22/2015 0428   HDL 53 04/22/2015 0428   CHOLHDL 3.0 04/22/2015 0428   VLDL 19 04/22/2015 0428   LDLCALC 88  04/22/2015 0428   Lab Results  Component Value Date   HGBA1C 7.3* 04/22/2015      Component Value Date/Time   LABOPIA NONE DETECTED 04/22/2015 0050   COCAINSCRNUR POSITIVE* 04/22/2015 0050   LABBENZ NONE DETECTED 04/22/2015 0050   AMPHETMU NONE DETECTED 04/22/2015 0050   THCU NONE DETECTED 04/22/2015 0050   LABBARB NONE DETECTED 04/22/2015 0050     Recent Labs Lab 04/21/15 1848  ETH <5   I have personally reviewed the radiological images below and agree with the radiology interpretations.  William Gonzalez Contrast  04/21/2015    1. 6 mm acute ischemic infarct involving the dorsal aspect of the left middle cerebellar peduncle adjacent to the fourth ventricle. No associated hemorrhage or significant mass effect.  2. No other acute intracranial process.  3. Mild to moderate chronic small vessel ischemic disease involving the supratentorial white matter and pons.  4. Empty sella.     MRA of the Brain 04/22/2015 1. Mild distal small vessel disease, more prominent to the posterior circulation. 2. 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 3. No significant proximal stenosis, aneurysm, or branch vessel occlusion otherwise.  CUS - pending  2D echo - pending  PHYSICAL EXAM  Temp:  [97.2 F (36.2  C)-98.7 F (37.1 C)] 98.3 F (36.8 C) (04/30 1021) Pulse Rate:  [55-69] 68 (04/30 1021) Resp:  [16-20] 18 (04/30 1021) BP: (126-153)/(66-83) 153/72 mmHg (04/30 1021) SpO2:  [94 %-98 %] 98 % (04/30 1021)  General - Well nourished, well developed, in no apparent distress.  Ophthalmologic - Sharp disc margins OU.  Cardiovascular - Regular rate and rhythm with no murmur.  Mental Status -  Level of arousal and orientation to time, place, and person were intact. Language including expression, naming, repetition, comprehension was assessed and found intact. Fund of Knowledge was assessed and was intact.  Cranial Nerves II - XII - II - Visual field intact OU. III, IV,  VI - Extraocular movements intact. V - Facial sensation intact bilaterally. VII - Facial movement intact bilaterally. VIII - Hearing & vestibular intact bilaterally. X - Palate elevates symmetrically. XI - Chin turning & shoulder shrug intact bilaterally. XII - Tongue protrusion intact.  Motor Strength - The patient's strength was normal in all extremities and pronator drift was absent.  Bulk was normal and fasciculations were absent.   Motor Tone - Muscle tone was assessed at the neck and appendages and was normal.  Reflexes - The patient's reflexes were 1+ in all extremities and he had no pathological reflexes.  Sensory - Light touch, temperature/pinprick were assessed and were symmetrical.    Coordination - The patient had normal movements in the hands and feet with no ataxia or dysmetria.  Tremor was absent.  Gait and Station - deferred   ASSESSMENT/PLAN William. William Gonzalez is a 59 y.o. male with history of coronary artery disease, tobacco use, substance abuse including cocaine and alcohol, diabetes mellitus, and hypertension, presenting with dizziness and difficulty walking.  He did not receive IV t-PA due to late presentation.   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  Resultant - dizziness  MRI  as above  MRA  as above  Carotid Doppler  pending  2D Echo pending  LDL 88  HgbA1c pending  Subcutaneous heparin for VTE prophylaxis Diet heart healthy/carb modified Room service appropriate?: Yes; Fluid consistency:: Thin  aspirin 81 mg orally every day and clopidogrel 75 mg orally every day prior to admission, now on aspirin 325 mg orally every day  Ongoing aggressive stroke risk factor management  Therapy recommendations: No follow-up physical therapy  Disposition:  Pending  Hypertension  Home meds: Lopressor, Norvasc, and lisinopril  Stable  Hyperlipidemia  Home meds: No lipid  lowering medications prior to admission.  LDL 88, goal < 70  Now on Lipitor 80 mg daily  Continue statin at discharge  Diabetes  HgbA1c pending, goal < 7.0  Uncontrolled  Tobacco abuse  Current smoker  Smoking cessation counseling provided  Pt is willing to quit  Cocaine abuse  UDS positive for cocaine  Pt admitted cocaine use recently  Cocaine cessation counseling provided  Pt is willing to quit  Other Stroke Risk Factors  Advanced age  ETOH use  Coronary artery disease  Other Active Problems    Other Pertinent History  Iodine allergy    Stroke likely in the setting of Cocaine and chronic smoker.  Symptoms improved.  Imaging showing L cerebellar infarct. Awaiting 2decho and carotid.   D/c planning Con't ASA Will Sign off please call with questions.  Cell phone in amion.   F/up neurology as out pt in 4-6 weeks.  Hospital day # 2  William Gonzalez  04/23/2015 11:32 AM       To contact Stroke Continuity provider, please refer to [email or web address]. After hours, contact General Neurology

## 2015-04-23 NOTE — Progress Notes (Signed)
[facility] PROGRESS NOTE  William Gonzalez NWG:[id number] DOB: 1956/09/27 DOA: 04/21/2015 PCP: PROVIDER NOT IN SYSTEM  Assessment/Plan: Principal Problem:   Cerebellar infarct - Neurology on board and managing - Awaiting further recommendations from neurologist after workup complete. - Patient currently on aspirin  Active Problems:   Coronary artery disease - No chest pain reported today. - Troponins 3 negative    Hypertension - Holding blood pressure medications in lieu of principal problem.    Tobacco abuse - We'll continue to encourage cessation    History of Alcohol abuse - Ativan on board for when necessary use    Diabetes mellitus without complication - Stable, continue current insulin regimen  Code Status: full Family Communication: No family at bedside Disposition Plan: Pending further workup   Consultants:  Neurology  Procedures:  None  Antibiotics:  None  HPI/Subjective: Patient has no new complaints. No acute issues reported overnight  Objective: Filed Vitals:   04/23/15 1021  BP: 153/72  Pulse: 68  Temp: 98.3 F (36.8 C)  Resp: 18    Intake/Output Summary (Last 24 hours) at 04/23/15 1038 Last data filed at 04/23/15 1015  Gross per 24 hour  Intake    698 ml  Output   1150 ml  Net   -452 ml   Filed Weights   04/21/15 1418  Weight: 74.844 kg (165 lb)    Exam:   General:  Patient in no acute distress, alert and awake  Cardiovascular: Regular rate and rhythm, no murmurs or rubs  Respiratory: Clear to auscultation bilaterally, no wheezes, no increased work of breathing  Abdomen: Soft, nondistended, nontender  Musculoskeletal: No cyanosis or clubbing   Data Reviewed: Basic Metabolic Panel:  Recent Labs Lab 04/21/15 1427  NA 137  K 4.0  CL 105  CO2 23  GLUCOSE 129*  BUN 7  CREATININE 0.85  CALCIUM 9.4   Liver Function Tests:  Recent Labs Lab 04/21/15 1427  AST 14  ALT 13  ALKPHOS 104  BILITOT 0.9   PROT 6.8  ALBUMIN 3.8   No results for input(s): LIPASE, AMYLASE in the last 168 hours. No results for input(s): AMMONIA in the last 168 hours. CBC:  Recent Labs Lab 04/21/15 1427  WBC 10.1  NEUTROABS 6.7  HGB 12.5*  HCT 37.3*  MCV 100.8*  PLT 341   Cardiac Enzymes:  Recent Labs Lab 04/22/15 0008 04/22/15 0428 04/22/15 1135  TROPONINI <0.03 <0.03 <0.03   BNP (last 3 results) No results for input(s): BNP in the last 8760 hours.  ProBNP (last 3 results) No results for input(s): PROBNP in the last 8760 hours.  CBG:  Recent Labs Lab 04/22/15 0808 04/22/15 1104 04/22/15 1645 04/22/15 2104 04/23/15 0800  GLUCAP 139* 238* 180* 206* 150*    No results found for this or any previous visit (from the past 240 hour(s)).   Studies: [name]  04/21/2015   CLINICAL DATA:  Initial evaluation for acute lightheadedness, ataxia, dizziness.  EXAM: MRI HEAD WITHOUT CONTRAST  TECHNIQUE: Multiplanar, multiecho pulse sequences of the brain and surrounding structures were obtained without intravenous contrast.  COMPARISON:  Prior CT from 03/06/2013.  FINDINGS: CSF volume within normal limits for patient age. Patchy and confluent T2/FLAIR hyperintensity within the periventricular and deep white matter both cerebral hemispheres most consistent with chronic small vessel ischemic disease, mild-to-moderate in nature. Similar changes seen within the pons.  There is a 6 mm focus of restricted diffusion within the dorsal aspect of the left  middle cerebellar peduncle, adjacent to the fourth ventricle (series 8, image 16). Finding consistent with a small acute ischemic infarct. No associated hemorrhage or significant mass effect. No other abnormal foci of restricted diffusion identified. Gray-white matter differentiation otherwise maintained. Normal intravascular flow voids are preserved.  No mass lesion, mass effect, or midline shift. Ventricles are normal in size without evidence of  hydrocephalus. No extra-axial fluid collection.  Craniocervical junction within normal limits. Scattered degenerative changes noted within the upper cervical spine.  Incidental note made of an empty sella.  Orbits within normal limits. Scattered mucoperiosteal thickening present within the ethmoidal air cells and right maxillary sinus. Retention cyst present within the left maxillary sinus. No mastoid effusion. Inner ear structures normal.  Bone marrow signal intensity within normal limits. No scalp soft tissue abnormality.  IMPRESSION: 1. 6 mm acute ischemic infarct involving the dorsal aspect of the left middle cerebellar peduncle adjacent to the fourth ventricle. No associated hemorrhage or significant mass effect. 2. No other acute intracranial process. 3. Mild to moderate chronic small vessel ischemic disease involving the supratentorial white matter and pons. 4. Empty sella.   Electronically Signed   By: Rise MuBenjamin  McClintock M.D.   On: 04/21/2015 20:56   Mr Maxine GlennMra Head/brain Wo Cm  04/22/2015   CLINICAL DATA:  Sudden onset of dizziness yesterday. Punctate infarct within the left middle cerebellar peduncle.  EXAM: MRA HEAD WITHOUT CONTRAST  TECHNIQUE: Angiographic images of the Circle of Willis were obtained using MRA technique without intravenous contrast.  COMPARISON:  None.  FINDINGS: Internal carotid arteries are within normal limits from the high cervical segments through the ICA termini. The A1 and M1 segments are normal. Anterior communicating artery is patent. The MCA bifurcations are within normal limits. ACA and MCA branch vessels are within normal limits.  The right vertebral artery is the dominant vessel. The right PICA origin is visualized and normal. 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 The distal left PCA is small. The basilar artery is within normal limits. The right posterior cerebral artery is of fetal type. There is mild attenuation of distal PCA branch vessels.   IMPRESSION: 1. Mild distal small vessel disease, more prominent to the posterior circulation. 2. 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 3. No significant proximal stenosis, aneurysm, or branch vessel occlusion otherwise.   Electronically Signed   By: Marin Robertshristopher  Mattern M.D.   On: 04/22/2015 08:15    Scheduled Meds: . aspirin  300 mg Rectal Daily   Or  . aspirin  325 mg Oral Daily  . atorvastatin  80 mg Oral q1800  . folic acid  1 mg Oral Daily  . heparin  5,000 Units Subcutaneous 3 times per day  . insulin aspart  0-5 Units Subcutaneous QHS  . insulin aspart  0-9 Units Subcutaneous TID WC  . LORazepam  0-4 mg Intravenous Q6H   Followed by  . [START ON 04/24/2015] LORazepam  0-4 mg Intravenous Q12H  . metoprolol tartrate  12.5 mg Oral BID  . nicotine  21 mg Transdermal Daily  . pantoprazole  40 mg Oral Daily  . thiamine  100 mg Oral Daily   Or  . thiamine  100 mg Intravenous Daily   Continuous Infusions:   Time spent: > 35 minutes    Penny PiaVEGA, Ulis Kaps  [facility] Pager [phone] If 7PM-7AM, please contact night-coverage at [email or web address], password [email or web address] 04/23/2015, 10:38 AM  LOS: 2 days

## 2015-04-24 ENCOUNTER — Encounter (HOSPITAL_COMMUNITY): Payer: Self-pay | Admitting: *Deleted

## 2015-04-24 LAB — GLUCOSE, CAPILLARY: GLUCOSE-CAPILLARY: 186 mg/dL — AB (ref 70–99)

## 2015-04-24 MED ORDER — ASPIRIN 325 MG PO TABS: 325.0000 mg | ORAL_TABLET | Freq: Every day | ORAL | Status: DC

## 2015-04-24 MED ORDER — ATORVASTATIN CALCIUM 80 MG PO TABS: 80.0000 mg | ORAL_TABLET | Freq: Every day | ORAL | Status: DC

## 2015-04-24 MED ORDER — ACETAMINOPHEN 325 MG PO TABS: 650.0000 mg | ORAL_TABLET | Freq: Four times a day (QID) | ORAL | Status: DC | PRN

## 2015-04-24 NOTE — Discharge Summary (Signed)
Physician Discharge Summary  William CablesLarry J Gonzalez ZOX:[id number] DOB: [birth year] DOA: 04/21/2015  PCP: PROVIDER NOT IN SYSTEM  Admit date: 04/21/2015 Discharge date: 04/24/2015  Time spent: > 35 minutes  Recommendations for Outpatient Follow-up:  Please encourage nicotine and cocaine cessation Monitor blood pressures Try to reach goal of LDL < 70  Discharge Diagnoses:  Principal Problem:   Cerebellar infarct Active Problems:   Coronary artery disease   Hypertension   Tobacco abuse   Alcohol abuse   Stroke   Diabetes mellitus without complication   Ataxic gait   Cocaine abuse   Discharge Condition: stable  Diet recommendation: heart healthy diet  Filed Weights   04/21/15 1418  Weight: 74.844 kg (165 lb)    History of present illness:  59 y/o AAM with history of htn, DM, cad with stents, tobacco abuse, alcohol abuse who presented with new onset stroke  Hospital Course:  Cerebellar infarct - Neurology on board while patient in house and recommended the following: Stroke likely in the setting of Cocaine and chronic smoker. Symptoms improved. Imaging showing L cerebellar infarct. Awaiting 2decho and carotid.  D/c planning Con't ASA  Active Problems:  Coronary artery disease - No chest pain reported - Troponins 3 negative   Hypertension - continue amlodipine and B blocker on discharge.   Tobacco abuse - encouraged cessation   History of Alcohol abuse - Ativan on board for when necessary use   Diabetes mellitus without complication - Stable, continue patient's metformin on discharge   Procedures: [name]  04/21/2015  1. 6 mm acute ischemic infarct involving the dorsal aspect of the left middle cerebellar peduncle adjacent to the fourth ventricle. No associated hemorrhage or significant mass effect.  2. No other acute intracranial process.  3. Mild to moderate chronic small vessel ischemic disease involving the supratentorial white  matter and pons.  4. Empty sella.   MRA of the Brain 04/22/2015 1. Mild distal small vessel disease, more prominent to the posterior circulation. 2. 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 3. No significant proximal stenosis, aneurysm, or branch vessel occlusion otherwise.  Consultations:  Neurology: Dr. Loretha BrasilZeylikman  Discharge Exam: Filed Vitals:   04/24/15 0419  BP: 153/81  Pulse: 65  Temp: 97.8 F (36.6 C)  Resp: 16    General: Pt in nad, alert and awake Cardiovascular: rrr, nomrg Respiratory: cta bl, no wheezes  Discharge Instructions   Discharge Instructions    Call MD for:  difficulty breathing, headache or visual disturbances    Complete by:  As directed      Call MD for:  severe uncontrolled pain    Complete by:  As directed      Call MD for:  temperature >100.4    Complete by:  As directed      Diet - low sodium heart healthy    Complete by:  As directed      Discharge instructions    Complete by:  As directed   Please be sure to follow up with your  Neurologist in 2 weeks after discharge. Refrain from smoking or using cocaine.     Increase activity slowly    Complete by:  As directed           Current Discharge Medication List    START taking these medications   Details  aspirin 325 MG tablet Take 1 tablet (325 mg total) by mouth daily. Qty: 30 tablet, Refills: 00    atorvastatin (LIPITOR) 80 MG  tablet Take 1 tablet (80 mg total) by mouth daily at 6 PM. Qty: 30 tablet, Refills: 0      CONTINUE these medications which have NOT CHANGED   Details  metoprolol tartrate (LOPRESSOR) 25 MG tablet Take 12.5 mg by mouth 2 (two) times daily.    omeprazole (PRILOSEC) 20 MG capsule Take 20 mg by mouth daily.    amLODipine (NORVASC) 2.5 MG tablet Take 1 tablet (2.5 mg total) by mouth daily. Qty: 30 tablet, Refills: 0    metFORMIN (GLUCOPHAGE) 1000 MG tablet Take 1 tablet (1,000 mg total) by mouth 2 (two) times daily with a meal. Qty:  60 tablet, Refills: 0    sertraline (ZOLOFT) 100 MG tablet Take 1 tablet (100 mg total) by mouth 2 (two) times daily. Qty: 60 tablet, Refills: 0      STOP taking these medications     aspirin 81 MG chewable tablet      ibuprofen (ADVIL,MOTRIN) 400 MG tablet      aspirin EC 81 MG tablet      clopidogrel (PLAVIX) 75 MG tablet      lisinopril-hydrochlorothiazide (PRINZIDE,ZESTORETIC) 20-25 MG per tablet        Allergies  Allergen Reactions  . Iodine Anaphylaxis and Swelling    Swells all over      The results of significant diagnostics from this hospitalization (including imaging, microbiology, ancillary and laboratory) are listed below for reference.    Significant Diagnostic Studies: [name]  04/21/2015   CLINICAL DATA:  Initial evaluation for acute lightheadedness, ataxia, dizziness.  EXAM: MRI HEAD WITHOUT CONTRAST  TECHNIQUE: Multiplanar, multiecho pulse sequences of the brain and surrounding structures were obtained without intravenous contrast.  COMPARISON:  Prior CT from 03/06/2013.  FINDINGS: CSF volume within normal limits for patient age. Patchy and confluent T2/FLAIR hyperintensity within the periventricular and deep white matter both cerebral hemispheres most consistent with chronic small vessel ischemic disease, mild-to-moderate in nature. Similar changes seen within the pons.  There is a 6 mm focus of restricted diffusion within the dorsal aspect of the left middle cerebellar peduncle, adjacent to the fourth ventricle (series 8, image 16). Finding consistent with a small acute ischemic infarct. No associated hemorrhage or significant mass effect. No other abnormal foci of restricted diffusion identified. Gray-white matter differentiation otherwise maintained. Normal intravascular flow voids are preserved.  No mass lesion, mass effect, or midline shift. Ventricles are normal in size without evidence of hydrocephalus. No extra-axial fluid collection.   Craniocervical junction within normal limits. Scattered degenerative changes noted within the upper cervical spine.  Incidental note made of an empty sella.  Orbits within normal limits. Scattered mucoperiosteal thickening present within the ethmoidal air cells and right maxillary sinus. Retention cyst present within the left maxillary sinus. No mastoid effusion. Inner ear structures normal.  Bone marrow signal intensity within normal limits. No scalp soft tissue abnormality.  IMPRESSION: 1. 6 mm acute ischemic infarct involving the dorsal aspect of the left middle cerebellar peduncle adjacent to the fourth ventricle. No associated hemorrhage or significant mass effect. 2. No other acute intracranial process. 3. Mild to moderate chronic small vessel ischemic disease involving the supratentorial white matter and pons. 4. Empty sella.   Electronically Signed   By: Rise Mu M.D.   On: 04/21/2015 20:56   Mr William Gonzalez Head/brain Wo Cm  04/22/2015   CLINICAL DATA:  Sudden onset of dizziness yesterday. Punctate infarct within the left middle cerebellar peduncle.  EXAM: MRA HEAD WITHOUT CONTRAST  TECHNIQUE: Angiographic images of the Circle of Willis were obtained using MRA technique without intravenous contrast.  COMPARISON:  None.  FINDINGS: Internal carotid arteries are within normal limits from the high cervical segments through the ICA termini. The A1 and M1 segments are normal. Anterior communicating artery is patent. The MCA bifurcations are within normal limits. ACA and MCA branch vessels are within normal limits.  The right vertebral artery is the dominant vessel. The right PICA origin is visualized and normal. 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 The distal left PCA is small. The basilar artery is within normal limits. The right posterior cerebral artery is of fetal type. There is mild attenuation of distal PCA branch vessels.  IMPRESSION: 1. Mild distal small vessel disease, more  prominent to the posterior circulation. 2. 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 3. No significant proximal stenosis, aneurysm, or branch vessel occlusion otherwise.   Electronically Signed   By: Marin Roberts M.D.   On: 04/22/2015 08:15    Microbiology: No results found for this or any previous visit (from the past 240 hour(s)).   Labs: Basic Metabolic Panel:  Recent Labs Lab 04/21/15 1427  NA 137  K 4.0  CL 105  CO2 23  GLUCOSE 129*  BUN 7  CREATININE 0.85  CALCIUM 9.4   Liver Function Tests:  Recent Labs Lab 04/21/15 1427  AST 14  ALT 13  ALKPHOS 104  BILITOT 0.9  PROT 6.8  ALBUMIN 3.8   No results for input(s): LIPASE, AMYLASE in the last 168 hours. No results for input(s): AMMONIA in the last 168 hours. CBC:  Recent Labs Lab 04/21/15 1427  WBC 10.1  NEUTROABS 6.7  HGB 12.5*  HCT 37.3*  MCV 100.8*  PLT 341   Cardiac Enzymes:  Recent Labs Lab 04/22/15 0008 04/22/15 0428 04/22/15 1135  TROPONINI <0.03 <0.03 <0.03   BNP: BNP (last 3 results) No results for input(s): BNP in the last 8760 hours.  ProBNP (last 3 results) No results for input(s): PROBNP in the last 8760 hours.  CBG:  Recent Labs Lab 04/23/15 0800 04/23/15 1105 04/23/15 1644 04/23/15 2107 04/24/15 0810  GLUCAP 150* 182* 174* 200* 186*       Signed:  Penny Pia  [facility] 04/24/2015, 10:34 AM

## 2015-04-24 NOTE — Progress Notes (Signed)
William CablesLarry J Tolin discharged Home with cousin per MD order.  Discharge instructions reviewed and discussed with the patient, all questions and concerns answered. Copy of instructions, care notes for stroke, risks factors & new medication and scripts given to patient.    Medication List    STOP taking these medications        aspirin 81 MG chewable tablet     aspirin EC 81 MG tablet  Replaced by:  aspirin 325 MG tablet     clopidogrel 75 MG tablet  Commonly known as:  PLAVIX     ibuprofen 400 MG tablet  Commonly known as:  ADVIL,MOTRIN     lisinopril-hydrochlorothiazide 20-25 MG per tablet  Commonly known as:  PRINZIDE,ZESTORETIC      TAKE these medications        amLODipine 2.5 MG tablet  Commonly known as:  NORVASC  Take 1 tablet (2.5 mg total) by mouth daily.     aspirin 325 MG tablet  Take 1 tablet (325 mg total) by mouth daily.     atorvastatin 80 MG tablet  Commonly known as:  LIPITOR  Take 1 tablet (80 mg total) by mouth daily at 6 PM.     metFORMIN 1000 MG tablet  Commonly known as:  GLUCOPHAGE  Take 1 tablet (1,000 mg total) by mouth 2 (two) times daily with a meal.     metoprolol tartrate 25 MG tablet  Commonly known as:  LOPRESSOR  Take 12.5 mg by mouth 2 (two) times daily.     omeprazole 20 MG capsule  Commonly known as:  PRILOSEC  Take 20 mg by mouth daily.     sertraline 100 MG tablet  Commonly known as:  ZOLOFT  Take 1 tablet (100 mg total) by mouth 2 (two) times daily.        Patients skin is clean, dry and intact, no evidence of skin break down. IV site discontinued and catheter remains intact. Site without signs and symptoms of complications. Dressing and pressure applied.  Patient escorted to car by NT ambulating,  no distress noted upon discharge.  Laural BenesJohnson, Leonell Lobdell C 04/24/2015 12:10 PM

## 2015-04-25 NOTE — Progress Notes (Signed)
CARE MANAGEMENT NOTE 04/25/2015  Patient:  Pearline CablesWATERMAN,Jonathyn J   Account Number:  [id number]  Date Initiated:  04/22/2015  Documentation initiated by:  [facility]  Subjective/Objective Assessment:   Stroke     Action/Plan:   Anticipated DC Date:  04/24/2015   Anticipated DC Plan:  HOME/SELF CARE      DC Planning Services  CM consult      Choice offered to / List presented to:             Status of service:  Completed, signed off Medicare Important Message given?  NO (If response is "NO", the following Medicare IM given date fields will be blank) Date Medicare IM given:   Medicare IM given by:   Date Additional Medicare IM given:   Additional Medicare IM given by:    Discharge Disposition:  HOME/SELF CARE  Per UR Regulation:  Reviewed for med. necessity/level of care/duration of stay  If discussed at Long Length of Stay Meetings, dates discussed:    Comments:  04/22/2015 1120 Chart reviewed. Utilization review complete. Isidoro DonningAlesia Shavis RN Case Mgmt [phone]

## 2018-04-18 ENCOUNTER — Emergency Department (HOSPITAL_COMMUNITY): Payer: Non-veteran care

## 2018-04-18 ENCOUNTER — Inpatient Hospital Stay (HOSPITAL_COMMUNITY)
Admission: EM | Admit: 2018-04-18 | Discharge: 2018-04-21 | DRG: 066 | Disposition: A | Discharge status: Home Health | Payer: Non-veteran care | Attending: Internal Medicine | Admitting: Internal Medicine

## 2018-04-18 ENCOUNTER — Encounter (HOSPITAL_COMMUNITY): Payer: Self-pay | Admitting: Emergency Medicine

## 2018-04-18 DIAGNOSIS — Z833 Family history of diabetes mellitus: Secondary | ICD-10-CM

## 2018-04-18 DIAGNOSIS — R29700 NIHSS score 0: Secondary | ICD-10-CM | POA: Diagnosis present

## 2018-04-18 DIAGNOSIS — Z8249 Family history of ischemic heart disease and other diseases of the circulatory system: Secondary | ICD-10-CM

## 2018-04-18 DIAGNOSIS — E119 Type 2 diabetes mellitus without complications: Secondary | ICD-10-CM | POA: Diagnosis not present

## 2018-04-18 DIAGNOSIS — I6381 Other cerebral infarction due to occlusion or stenosis of small artery: Principal | ICD-10-CM | POA: Diagnosis present

## 2018-04-18 DIAGNOSIS — I6789 Other cerebrovascular disease: Secondary | ICD-10-CM | POA: Diagnosis not present

## 2018-04-18 DIAGNOSIS — D72829 Elevated white blood cell count, unspecified: Secondary | ICD-10-CM | POA: Diagnosis present

## 2018-04-18 DIAGNOSIS — D649 Anemia, unspecified: Secondary | ICD-10-CM | POA: Diagnosis not present

## 2018-04-18 DIAGNOSIS — Z8673 Personal history of transient ischemic attack (TIA), and cerebral infarction without residual deficits: Secondary | ICD-10-CM

## 2018-04-18 DIAGNOSIS — I10 Essential (primary) hypertension: Secondary | ICD-10-CM | POA: Diagnosis not present

## 2018-04-18 DIAGNOSIS — F17210 Nicotine dependence, cigarettes, uncomplicated: Secondary | ICD-10-CM | POA: Diagnosis present

## 2018-04-18 DIAGNOSIS — G8324 Monoplegia of upper limb affecting left nondominant side: Secondary | ICD-10-CM | POA: Diagnosis present

## 2018-04-18 DIAGNOSIS — Z7982 Long term (current) use of aspirin: Secondary | ICD-10-CM

## 2018-04-18 DIAGNOSIS — R42 Dizziness and giddiness: Secondary | ICD-10-CM | POA: Diagnosis present

## 2018-04-18 DIAGNOSIS — K219 Gastro-esophageal reflux disease without esophagitis: Secondary | ICD-10-CM | POA: Diagnosis present

## 2018-04-18 DIAGNOSIS — I6300 Cerebral infarction due to thrombosis of unspecified precerebral artery: Secondary | ICD-10-CM | POA: Diagnosis not present

## 2018-04-18 DIAGNOSIS — I2510 Atherosclerotic heart disease of native coronary artery without angina pectoris: Secondary | ICD-10-CM | POA: Diagnosis present

## 2018-04-18 DIAGNOSIS — E785 Hyperlipidemia, unspecified: Secondary | ICD-10-CM | POA: Diagnosis present

## 2018-04-18 DIAGNOSIS — Z7984 Long term (current) use of oral hypoglycemic drugs: Secondary | ICD-10-CM

## 2018-04-18 DIAGNOSIS — R270 Ataxia, unspecified: Secondary | ICD-10-CM | POA: Diagnosis present

## 2018-04-18 DIAGNOSIS — Z955 Presence of coronary angioplasty implant and graft: Secondary | ICD-10-CM

## 2018-04-18 DIAGNOSIS — I639 Cerebral infarction, unspecified: Secondary | ICD-10-CM

## 2018-04-18 LAB — CBC WITH DIFFERENTIAL/PLATELET
BASOS ABS: 0 10*3/uL (ref 0.0–0.1)
BASOS PCT: 0 %
EOS PCT: 2 %
Eosinophils Absolute: 0.2 10*3/uL (ref 0.0–0.7)
HCT: 37.1 % — ABNORMAL LOW (ref 39.0–52.0)
Hemoglobin: 12.5 g/dL — ABNORMAL LOW (ref 13.0–17.0)
Lymphocytes Relative: 22 %
Lymphs Abs: 2.4 10*3/uL (ref 0.7–4.0)
MCH: 33.4 pg (ref 26.0–34.0)
MCHC: 33.7 g/dL (ref 30.0–36.0)
MCV: 99.2 fL (ref 78.0–100.0)
Monocytes Absolute: 0.6 10*3/uL (ref 0.1–1.0)
Monocytes Relative: 6 %
Neutro Abs: 7.5 10*3/uL (ref 1.7–7.7)
Neutrophils Relative %: 70 %
Platelets: 308 10*3/uL (ref 150–400)
RBC: 3.74 MIL/uL — AB (ref 4.22–5.81)
RDW: 12.9 % (ref 11.5–15.5)
WBC: 10.7 10*3/uL — ABNORMAL HIGH (ref 4.0–10.5)

## 2018-04-18 LAB — BASIC METABOLIC PANEL
Anion gap: 8 (ref 5–15)
BUN: 9 mg/dL (ref 6–20)
CALCIUM: 9.3 mg/dL (ref 8.9–10.3)
CO2: 23 mmol/L (ref 22–32)
Chloride: 107 mmol/L (ref 101–111)
Creatinine, Ser: 0.79 mg/dL (ref 0.61–1.24)
GFR calc Af Amer: >60 mL/min (ref >60)
Glucose, Bld: 170 mg/dL — ABNORMAL HIGH (ref 65–99)
POTASSIUM: 4 mmol/L (ref 3.5–5.1)
SODIUM: 138 mmol/L (ref 135–145)

## 2018-04-18 LAB — URINALYSIS, ROUTINE W REFLEX MICROSCOPIC
BILIRUBIN URINE: NEGATIVE
Bacteria, UA: NONE SEEN
Hgb urine dipstick: NEGATIVE
KETONES UR: NEGATIVE mg/dL
Leukocytes, UA: NEGATIVE
NITRITE: NEGATIVE
Protein, ur: 100 mg/dL — AB
SPECIFIC GRAVITY, URINE: 1.015 (ref 1.005–1.030)
pH: 6 (ref 5.0–8.0)

## 2018-04-18 LAB — GLUCOSE, CAPILLARY: Glucose-Capillary: 211 mg/dL — ABNORMAL HIGH (ref 65–99)

## 2018-04-18 LAB — RAPID URINE DRUG SCREEN, HOSP PERFORMED
Amphetamines: NOT DETECTED
Barbiturates: NOT DETECTED
Benzodiazepines: NOT DETECTED
Cocaine: POSITIVE — AB
Opiates: NOT DETECTED
TETRAHYDROCANNABINOL: NOT DETECTED

## 2018-04-18 LAB — HEPATIC FUNCTION PANEL
ALBUMIN: 3.3 g/dL — AB (ref 3.5–5.0)
ALK PHOS: 93 U/L (ref 38–126)
ALT: 13 U/L — AB (ref 17–63)
AST: 13 U/L — AB (ref 15–41)
BILIRUBIN TOTAL: 0.6 mg/dL (ref 0.3–1.2)
Total Protein: 6.1 g/dL — ABNORMAL LOW (ref 6.5–8.1)

## 2018-04-18 LAB — ETHANOL

## 2018-04-18 MED ORDER — LATANOPROST 0.005 % OP SOLN
1.0000 [drp] | Freq: Every day | OPHTHALMIC | Status: DC
  Administered 2018-04-18 – 2018-04-20 (×3): 1 [drp] via OPHTHALMIC
  Filled 2018-04-18: qty 2.5

## 2018-04-18 MED ORDER — ATORVASTATIN CALCIUM 80 MG PO TABS
80.0000 mg | ORAL_TABLET | Freq: Every day | ORAL | Status: DC
  Administered 2018-04-18 – 2018-04-20 (×3): 80 mg via ORAL
  Filled 2018-04-18 (×3): qty 1

## 2018-04-18 MED ORDER — ASPIRIN 300 MG RE SUPP: 300.0000 mg | Freq: Every day | RECTAL | Status: DC

## 2018-04-18 MED ORDER — STROKE: EARLY STAGES OF RECOVERY BOOK
Freq: Once | Status: AC
  Administered 2018-04-18: 22:00:00

## 2018-04-18 MED ORDER — INSULIN ASPART 100 UNIT/ML ~~LOC~~ SOLN
0.0000 [IU] | Freq: Three times a day (TID) | SUBCUTANEOUS | Status: DC
  Administered 2018-04-19: 3 [IU] via SUBCUTANEOUS
  Administered 2018-04-19: 1 [IU] via SUBCUTANEOUS
  Administered 2018-04-20: 2 [IU] via SUBCUTANEOUS

## 2018-04-18 MED ORDER — INSULIN ASPART 100 UNIT/ML ~~LOC~~ SOLN
0.0000 [IU] | Freq: Every day | SUBCUTANEOUS | Status: DC
  Administered 2018-04-18: 2 [IU] via SUBCUTANEOUS

## 2018-04-18 MED ORDER — METOPROLOL TARTRATE 12.5 MG HALF TABLET
12.5000 mg | ORAL_TABLET | Freq: Two times a day (BID) | ORAL | Status: DC
  Administered 2018-04-18 – 2018-04-21 (×6): 12.5 mg via ORAL
  Filled 2018-04-18 (×6): qty 1

## 2018-04-18 MED ORDER — ASPIRIN 325 MG PO TABS
325.0000 mg | ORAL_TABLET | Freq: Every day | ORAL | Status: DC
  Administered 2018-04-18 – 2018-04-21 (×4): 325 mg via ORAL
  Filled 2018-04-18 (×4): qty 1

## 2018-04-18 MED ORDER — METFORMIN HCL 500 MG PO TABS
1000.0000 mg | ORAL_TABLET | Freq: Two times a day (BID) | ORAL | Status: DC
  Administered 2018-04-19 – 2018-04-21 (×4): 1000 mg via ORAL
  Filled 2018-04-18 (×4): qty 2

## 2018-04-18 MED ORDER — ACETAMINOPHEN 650 MG RE SUPP: 650.0000 mg | RECTAL | Status: DC | PRN

## 2018-04-18 MED ORDER — GLIPIZIDE 5 MG PO TABS
10.0000 mg | ORAL_TABLET | Freq: Two times a day (BID) | ORAL | Status: DC
  Administered 2018-04-19 – 2018-04-21 (×4): 10 mg via ORAL
  Filled 2018-04-18 (×4): qty 2

## 2018-04-18 MED ORDER — ACETAMINOPHEN 325 MG PO TABS: 650.0000 mg | ORAL_TABLET | ORAL | Status: DC | PRN

## 2018-04-18 MED ORDER — ACETAMINOPHEN 160 MG/5ML PO SOLN: 650.0000 mg | ORAL | Status: DC | PRN

## 2018-04-18 MED ORDER — PANTOPRAZOLE SODIUM 40 MG PO TBEC
40.0000 mg | DELAYED_RELEASE_TABLET | Freq: Every day | ORAL | Status: DC
  Administered 2018-04-18 – 2018-04-21 (×4): 40 mg via ORAL
  Filled 2018-04-18 (×4): qty 1

## 2018-04-18 NOTE — ED Triage Notes (Addendum)
Pt states last night around 8pm he felt dizzy. Yesterday in daytime he was fine, mowed the grass. Pt states today he tried to get up and almost fell, felt dizzy, has weakness to left arm with a drift. Reported some blurry vision earlier but not at present. Pt evaluated by the PA in triage.

## 2018-04-18 NOTE — ED Provider Notes (Signed)
[facility] EMERGENCY DEPARTMENT Provider Note   CSN: [id number] Arrival date & time: 04/18/18  1329     History   Chief Complaint Chief Complaint  Patient presents with  . Weakness    left sided  . Dizziness    HPI William Gonzalez is a 62 y.o. male.  HPI  62 year old man history of stroke, hypertension, diabetes presents today with new onset of dizziness described as difficulty walking and with gait last night around 8 PM.  Today, on awakening he noted that he had left arm weakness.  He denies headache, head injury, visual change.  He has not taken his medications today.  He states he is normally able to walk and had not had ongoing problems from his prior stroke.  Past Medical History:  Diagnosis Date  . Alcohol abuse   . Coronary artery disease   . Diabetes mellitus   . Hypertension   . Stented coronary artery   . Tobacco abuse     Patient Active Problem List   Diagnosis Date Noted  . Ataxic gait   . Cocaine abuse (HCC)   . Cerebellar infarct (HCC) 04/21/2015  . Tobacco abuse 04/21/2015  . Alcohol abuse 04/21/2015  . Stroke (HCC) 04/21/2015  . Diabetes mellitus without complication (HCC) 04/21/2015  . Coronary artery disease   . Hypertension     Past Surgical History:  Procedure Laterality Date  . CORONARY STENT PLACEMENT          Home Medications    Prior to Admission medications   Medication Sig Start Date End Date Taking? Authorizing Provider  acetaminophen (TYLENOL) 500 MG tablet Take 1,000 mg by mouth as needed for mild pain.   Yes [provider]  aspirin 325 MG tablet Take 1 tablet (325 mg total) by mouth daily. 04/24/15  Yes Penny Pia, MD  atorvastatin (LIPITOR) 80 MG tablet Take 1 tablet (80 mg total) by mouth daily at 6 PM. 04/24/15  Yes Penny Pia, MD  glipiZIDE (GLUCOTROL) 10 MG tablet Take 10 mg by mouth 2 (two) times daily before a meal.   Yes [provider]  hydrocortisone cream 1 % Apply 1  application topically as needed for itching (rash).   Yes [provider]  latanoprost (XALATAN) 0.005 % ophthalmic solution Place 1 drop into both eyes at bedtime.   Yes [provider]  Liniments (BLUE-EMU SUPER STRENGTH) CREA Apply 1 application topically as needed (shoulder or back pain).   Yes [provider]  lisinopril (PRINIVIL,ZESTRIL) 10 MG tablet Take 10 mg by mouth daily.   Yes [provider]  metFORMIN (GLUCOPHAGE) 1000 MG tablet Take 1 tablet (1,000 mg total) by mouth 2 (two) times daily with a meal. 07/04/12  Yes Molpus, John, MD  metoprolol tartrate (LOPRESSOR) 25 MG tablet Take 12.5 mg by mouth 2 (two) times daily.   Yes [provider]  omeprazole (PRILOSEC) 20 MG capsule Take 20 mg by mouth daily.   Yes [provider]  amLODipine (NORVASC) 2.5 MG tablet Take 1 tablet (2.5 mg total) by mouth daily. Patient not taking: Reported on 04/21/2015 07/04/12   Molpus, Jonny Ruiz, MD  sertraline (ZOLOFT) 100 MG tablet Take 1 tablet (100 mg total) by mouth 2 (two) times daily. Patient not taking: Reported on 04/21/2015 07/04/12   Molpus, Jonny Ruiz, MD    Family History Family History  Problem Relation Age of Onset  . Hypertension Mother   . Diabetes Mother   . Hypertension Father   .  Diabetes Father   . Hypertension Sister   . Hypertension Brother   . Diabetes Sister   . Diabetes Brother     Social History Social History   Tobacco Use  . Smoking status: Current Every Day Smoker  Substance Use Topics  . Alcohol use: Yes  . Drug use: Yes    Types: Cocaine     Allergies   Iodine   Review of Systems Review of Systems   Physical Exam Updated Vital Signs BP (!) 192/76 (BP Location: Left Arm)   Pulse 70   Temp 97.9 F (36.6 C) (Oral)   Resp 17   SpO2 97%   Physical Exam  Constitutional: He is oriented to person, place, and time. He appears well-developed and well-nourished. No distress.  HENT:  Head: Normocephalic and  atraumatic.  Right Ear: External ear normal.  Left Ear: External ear normal.  Nose: Nose normal.  Mouth/Throat: Oropharynx is clear and moist.  Eyes: Pupils are equal, round, and reactive to light. Conjunctivae and EOM are normal.  Neck: Normal range of motion. Neck supple.  Cardiovascular: Normal rate, regular rhythm and normal heart sounds.  Pulmonary/Chest: Effort normal and breath sounds normal.  Abdominal: Soft. Bowel sounds are normal.  Musculoskeletal: Normal range of motion.  Neurological: He is alert and oriented to person, place, and time. No sensory deficit. He exhibits normal muscle tone.  Facial asymmetry noted no obvious left facial droop Left palmar drift noted Lower extremities bilaterally 4 out of 5 Right heel to left shin normal, left left heel  To right shin ataxic  Skin: Skin is warm and dry. Capillary refill takes less than 2 seconds.  Psychiatric: He has a normal mood and affect.  Nursing note and vitals reviewed.    ED Treatments / Results  Labs (all labs ordered are listed, but only abnormal results are displayed) Labs Reviewed  BASIC METABOLIC PANEL - Abnormal; Notable for the following components:      Result Value   Glucose, Bld 170 (*)    All other components within normal limits  CBC WITH DIFFERENTIAL/PLATELET - Abnormal; Notable for the following components:   WBC 10.7 (*)    RBC 3.74 (*)    Hemoglobin 12.5 (*)    HCT 37.1 (*)    All other components within normal limits  URINALYSIS, ROUTINE W REFLEX MICROSCOPIC  RAPID URINE DRUG SCREEN, HOSP PERFORMED    EKG EKG Interpretation  Date/Time:  Friday April 18 2018 13:36:16 EDT Ventricular Rate:  71 PR Interval:  148 QRS Duration: 106 QT Interval:  388 QTC Calculation: 421 R Axis:   -39 Text Interpretation:  Normal sinus rhythm Left axis deviation Septal infarct , age undetermined T wave abnormality, consider inferolateral ischemia Abnormal ECG No significant change since last tracing  Confirmed by Margarita Grizzleay, Yanixan Mellinger [phone] on 04/18/2018 6:14:48 PM   Radiology Ct Head Wo Contrast  Result Date: 04/18/2018 CLINICAL DATA:  Subacute neuro deficits.  Dizziness. EXAM: CT HEAD WITHOUT CONTRAST TECHNIQUE: Contiguous axial images were obtained from the base of the skull through the vertex without intravenous contrast. COMPARISON:  MRI 04/22/2015 FINDINGS: Brain: Mild atrophy. Mild chronic white matter changes. Negative for acute infarct. Negative for hemorrhage mass or edema. Vascular: Negative for hyperdense vessel Skull: Cystic lesion left parietal bone unchanged from 2016 MRI. No new skull lesion. Sinuses/Orbits: Mucosal edema in the paranasal sinuses. Normal orbit. Other: Enlargement of the sella filled with CSF compatible with empty sella. IMPRESSION: Atrophy and chronic microvascular ischemia.  Empty  sella. No acute abnormality. Electronically Signed   By: Marlan Palau M.D.   On: 04/18/2018 15:01    Procedures Procedures (including critical care time)  Medications Ordered in ED Medications - No data to display   Initial Impression / Assessment and Plan / ED Course  I have reviewed the triage vital signs and the nursing notes.  Pertinent labs & imaging results that were available during my care of the patient were reviewed by me and considered in my medical decision making (see chart for details).     Symptoms consistent with new onset of stroke.  Head CT reviewed no evidence of bleeding noted.  Patient is outside of acute stroke window with last known normal at approximately 8 PM last night.  He does not have LV symptoms.  Care discussed with Dr. Oliva Bustard, on-call for neurology and he will see in consult.  Patient is normally seen at [facility] will be admitted as unassigned medicine patient.  Consult pending Djiscussed with Dr. Selena Batten and will see for admission  Final Clinical Impressions(s) / ED Diagnoses   Final diagnoses:  None    ED Discharge Orders    None       Margarita Grizzle, MD 04/19/18 2330

## 2018-04-18 NOTE — ED Provider Notes (Signed)
Patient placed in Quick Look pathway, seen and evaluated   Chief Complaint: Dizziness, LUE waekness  HPI:   62 y.o. male with past medical history of cerebellar stroke (2016) who presents for evaluation of dizziness and left upper extremity weakness that began approximately 8 PM last night.  Patient reports that he fell off balance "like he was going to fall down."  Patient states that today, he had difficulty getting up out of bed secondary to the dizziness that he was experiencing.  He reports difficulty ambulating second to the dizziness.  Patient states he has not had any weakness in his leg.  Initially when the nurse discussed with patient, he said he had had some blurry vision last night.  On my evaluation he specifically denied any vision changes, blurry vision, double vision.  Patient denies any difficulty speaking, facial asymmetry, chest pain.  ROS: Dizziness, LUE weakness   Physical Exam:   Gen: No distress  Neuro: Awake and Alert  Skin: Warm  Eyes: No nystagmus.   Neuro:  Cranial nerves III-XII intact  Follows commands, Moves all extremities   5/5 strength to RUE and BLE. Diminished strength noted to LUE  Sensation intact throughout all major nerve distributions  Normal finger to nose.  No dysdiadochokinesia.  No pronator drift.  No gait abnormalities   No slurred speech. No facial droop.     As documented below, patient does not meet VN criteria.  Given the symptoms have been greater than 4 hours, no code stroke initiated at this time.  Initiation of care has begun. The patient has been counseled on the process, plan, and necessity for staying for the completion/evaluation, and the remainder of the medical screening examination    Large Artery Stroke Screening     Weakness:  Mild (minor drift)  Vision:  NONE  Aphasia:  NONE  Neglect:  NONE:  VAN =  Negative  If patient has any weakness PLUS any one of the below: Visual Disturbance (field cut, double, or blind  vision) Aphasia (inability to speak or understand) Neglect (gaze to one side or ignoring one side) This is likely a large artery clot (cortical symptoms) = VAN Positive                 Maxwell CaulLayden, Dracen Reigle A, PA-C 04/18/18 1356    Vanetta MuldersZackowski, Scott, MD 04/20/18 [phone]

## 2018-04-18 NOTE — ED Notes (Signed)
ED Provider at bedside. 

## 2018-04-18 NOTE — Consult Note (Addendum)
Referring Physician: Dr. Maudie Mercury    Chief Complaint: Left sided weakness  HPI: William Gonzalez is an 62 y.o. male who presented to the ED for evaluation of dizziness, LUE weakness and gait instability. He defines his "dizziness" as a sense of unsteadiness while walking, denying vertigo or presyncope. Symptoms begn last night at 8 PM with the sensation of "dizziness". He went to bed but this morning symptoms were still present - when he tried to get up he almost fell and noted LUE weakness. He denies LLE weakness. Also denies vision changes, facial droop and speech deficit.   Home medications include ASA 325 mg qd and atorvastatin 80 mg qd.   Past Medical History:  Diagnosis Date  . Alcohol abuse   . Coronary artery disease   . Diabetes mellitus   . Hypertension   . Stented coronary artery   . Tobacco abuse     Past Surgical History:  Procedure Laterality Date  . CORONARY STENT PLACEMENT      Family History  Problem Relation Age of Onset  . Hypertension Mother   . Diabetes Mother   . Hypertension Father   . Diabetes Father   . Hypertension Sister   . Hypertension Brother   . Diabetes Sister   . Diabetes Brother    Social History:  reports that he has been smoking cigarettes.  He has never used smokeless tobacco. He reports that he drinks alcohol. He reports that he has current or past drug history. Drug: Cocaine.  Allergies:  Allergies  Allergen Reactions  . Iodine Anaphylaxis and Swelling    Swells all over   Medications:  No current facility-administered medications on file prior to encounter.    Current Outpatient Medications on File Prior to Encounter  Medication Sig Dispense Refill  . acetaminophen (TYLENOL) 500 MG tablet Take 1,000 mg by mouth as needed for mild pain.    Marland Kitchen aspirin 325 MG tablet Take 1 tablet (325 mg total) by mouth daily. 30 tablet 00  . atorvastatin (LIPITOR) 80 MG tablet Take 1 tablet (80 mg total) by mouth daily at 6 PM. 30 tablet 0  . glipiZIDE  (GLUCOTROL) 10 MG tablet Take 10 mg by mouth 2 (two) times daily before a meal.    . hydrocortisone cream 1 % Apply 1 application topically as needed for itching (rash).    Marland Kitchen latanoprost (XALATAN) 0.005 % ophthalmic solution Place 1 drop into both eyes at bedtime.    . Liniments (BLUE-EMU SUPER STRENGTH) CREA Apply 1 application topically as needed (shoulder or back pain).    Marland Kitchen lisinopril (PRINIVIL,ZESTRIL) 10 MG tablet Take 10 mg by mouth daily.    . metFORMIN (GLUCOPHAGE) 1000 MG tablet Take 1 tablet (1,000 mg total) by mouth 2 (two) times daily with a meal. 60 tablet 0  . metoprolol tartrate (LOPRESSOR) 25 MG tablet Take 12.5 mg by mouth 2 (two) times daily.    Marland Kitchen omeprazole (PRILOSEC) 20 MG capsule Take 20 mg by mouth daily.    Marland Kitchen amLODipine (NORVASC) 2.5 MG tablet Take 1 tablet (2.5 mg total) by mouth daily. (Patient not taking: Reported on 04/21/2015) 30 tablet 0  . sertraline (ZOLOFT) 100 MG tablet Take 1 tablet (100 mg total) by mouth 2 (two) times daily. (Patient not taking: Reported on 04/21/2015) 60 tablet 0   ROS: No headache, chest pain or limb pain. Other ROS as per HPI.   Physical Examination: Blood pressure (!) 162/60, pulse 65, temperature 98.2 F (36.8 C), temperature  source Oral, resp. rate 18, height 5' 6"  (1.676 m), weight 66.5 kg (146 lb 9.7 oz), SpO2 100 %.  HEENT: [place]/AT Lungs: Respirations unlabored Ext: No edema  Neurologic Examination: Mental Status:  Alert, oriented, thought content appropriate.  Speech fluent with intact comprehension and naming.  Mild dysarthria noted.  Cranial Nerves: II:  Visual fields intact bilaterally; no extinction to DSS. PERRL.  III,IV, VI: EOMI without nystagmus. No ptosis. V,VII: Mild left facial droop. Facial temp sensation equal bilaterally VIII: hearing intact to conversation IX,X: Palate rises symmetrically XI: Mild lag on the left with SS XII: midline tongue extension  Motor: LUE 4-/5 deltoid and hand grip, 4/5 biceps and  triceps LLE: 4+/5. External rotation at rest is noted RUE and RLE 5/5 proximal and distal Subtle pronator drift on the left Sensory: Temp and light touch intact x 4 without extinction Deep Tendon Reflexes:  1+ bilateral upper ext Trace patellars bilaterally 0 achilles bilaterally Toes downgoing Cerebellar: Mild ataxia noted with left FNF and H-S. Normal on the right Gait: Deferred  Results for orders placed or performed during the hospital encounter of 04/18/18 (from the past 48 hour(s))  Basic metabolic panel     Status: Abnormal   Collection Time: 04/18/18  1:40 PM  Result Value Ref Range   Sodium 138 135 - 145 mmol/L   Potassium 4.0 3.5 - 5.1 mmol/L   Chloride 107 101 - 111 mmol/L   CO2 23 22 - 32 mmol/L   Glucose, Bld 170 (H) 65 - 99 mg/dL   BUN 9 6 - 20 mg/dL   Creatinine, Ser 0.79 0.61 - 1.24 mg/dL   Calcium 9.3 8.9 - 10.3 mg/dL   GFR calc non Af Amer >60 >60 mL/min   GFR calc Af Amer >60 >60 mL/min    Comment: (NOTE) The eGFR has been calculated using the CKD EPI equation. This calculation has not been validated in all clinical situations. eGFR's persistently <60 mL/min signify possible Chronic Kidney Disease.    Anion gap 8 5 - 15    Comment: Performed at [facility] [address], [place], [place] [address]  CBC with Differential     Status: Abnormal   Collection Time: 04/18/18  1:40 PM  Result Value Ref Range   WBC 10.7 (H) 4.0 - 10.5 K/uL   RBC 3.74 (L) 4.22 - 5.81 MIL/uL   Hemoglobin 12.5 (L) 13.0 - 17.0 g/dL   HCT 37.1 (L) 39.0 - 52.0 %   MCV 99.2 78.0 - 100.0 fL   MCH 33.4 26.0 - 34.0 pg   MCHC 33.7 30.0 - 36.0 g/dL   RDW 12.9 11.5 - 15.5 %   Platelets 308 150 - 400 K/uL   Neutrophils Relative % 70 %   Neutro Abs 7.5 1.7 - 7.7 K/uL   Lymphocytes Relative 22 %   Lymphs Abs 2.4 0.7 - 4.0 K/uL   Monocytes Relative 6 %   Monocytes Absolute 0.6 0.1 - 1.0 K/uL   Eosinophils Relative 2 %   Eosinophils Absolute 0.2 0.0 - 0.7 K/uL   Basophils  Relative 0 %   Basophils Absolute 0.0 0.0 - 0.1 K/uL    Comment: Performed at [facility] [address], [place], [place] [address]  Ethanol     Status: None   Collection Time: 04/18/18  6:32 PM  Result Value Ref Range   Alcohol, Ethyl (B) <10 <10 mg/dL    Comment:  LOWEST DETECTABLE LIMIT FOR SERUM ALCOHOL IS 10 mg/dL FOR MEDICAL PURPOSES ONLY Performed at [facility], [place] [address], [place], [place] [address]   Urinalysis, Routine w reflex microscopic     Status: Abnormal   Collection Time: 04/18/18  7:13 PM  Result Value Ref Range   Color, Urine YELLOW YELLOW   APPearance CLEAR CLEAR   Specific Gravity, Urine 1.015 1.005 - 1.030   pH 6.0 5.0 - 8.0   Glucose, UA >=500 (A) NEGATIVE mg/dL   Hgb urine dipstick NEGATIVE NEGATIVE   Bilirubin Urine NEGATIVE NEGATIVE   Ketones, ur NEGATIVE NEGATIVE mg/dL   Protein, ur 100 (A) NEGATIVE mg/dL   Nitrite NEGATIVE NEGATIVE   Leukocytes, UA NEGATIVE NEGATIVE   RBC / HPF 0-5 0 - 5 RBC/hpf   WBC, UA 0-5 0 - 5 WBC/hpf   Bacteria, UA NONE SEEN NONE SEEN   Squamous Epithelial / LPF 0-5 0 - 5    Comment: Please note change in reference range.   Mucus PRESENT     Comment: Performed at [facility], [place] [address] [place], [place] [address]  Urine rapid drug screen (hosp performed)     Status: Abnormal   Collection Time: 04/18/18  7:13 PM  Result Value Ref Range   Opiates NONE DETECTED NONE DETECTED   Cocaine POSITIVE (A) NONE DETECTED   Benzodiazepines NONE DETECTED NONE DETECTED   Amphetamines NONE DETECTED NONE DETECTED   Tetrahydrocannabinol NONE DETECTED NONE DETECTED   Barbiturates NONE DETECTED NONE DETECTED    Comment: (NOTE) DRUG SCREEN FOR MEDICAL PURPOSES ONLY.  IF CONFIRMATION IS NEEDED FOR ANY PURPOSE, NOTIFY LAB WITHIN 5 DAYS. LOWEST DETECTABLE LIMITS FOR URINE DRUG SCREEN Drug Class                     Cutoff (ng/mL) Amphetamine and metabolites    1000 Barbiturate and metabolites     200 Benzodiazepine                 701 Tricyclics and metabolites     300 Opiates and metabolites        300 Cocaine and metabolites        300 THC                            50 Performed at [facility], [place] [address] [place], [place] [address]   Glucose, capillary     Status: Abnormal   Collection Time: 04/18/18  9:36 PM  Result Value Ref Range   Glucose-Capillary 211 (H) 65 - 99 mg/dL   Comment 1 Notify RN   Hepatic function panel     Status: Abnormal   Collection Time: 04/18/18  9:53 PM  Result Value Ref Range   Total Protein 6.1 (L) 6.5 - 8.1 g/dL   Albumin 3.3 (L) 3.5 - 5.0 g/dL   AST 13 (L) 15 - 41 U/L   ALT 13 (L) 17 - 63 U/L   Alkaline Phosphatase 93 38 - 126 U/L   Total Bilirubin 0.6 0.3 - 1.2 mg/dL   Bilirubin, Direct <0.1 (L) 0.1 - 0.5 mg/dL   Indirect Bilirubin NOT CALCULATED 0.3 - 0.9 mg/dL    Comment: Performed at [facility] [address], [place], [place] [address]   Ct Head Wo Contrast  Result Date: 04/18/2018 CLINICAL DATA:  Subacute neuro deficits.  Dizziness. EXAM: CT HEAD WITHOUT CONTRAST  TECHNIQUE: Contiguous axial images were obtained from the base of the skull through the vertex without intravenous contrast. COMPARISON:  MRI 04/22/2015 FINDINGS: Brain: Mild atrophy. Mild chronic white matter changes. Negative for acute infarct. Negative for hemorrhage mass or edema. Vascular: Negative for hyperdense vessel Skull: Cystic lesion left parietal bone unchanged from 2016 MRI. No new skull lesion. Sinuses/Orbits: Mucosal edema in the paranasal sinuses. Normal orbit. Other: Enlargement of the sella filled with CSF compatible with empty sella. IMPRESSION: Atrophy and chronic microvascular ischemia.  Empty sella. No acute abnormality. Electronically Signed   By: Franchot Gallo M.D.   On: 04/18/2018 15:01    Assessment: 62 y.o. male presenting with left sided weakness, primarily upper extremity but also with lower extremity and face involvement. Mild  ataxia also noted. No sensory deficit. 1. Exam best localizes as a left frontal-cortical lesion, deep white matter, internal capsule, thalamus, basal ganglia or rostral pontine lesion, most likely a small MCA stroke or lacunar infarction.  2. CT head negative for acute. Atrophy and chronic microvascular ischemic changes are noted.  3. Stroke Risk Factors - CAD, HTN, smoking, DM 4. EKG shows no atrial fibrillation.  5. Classifiable as having failed ASA monotherapy 6. Cocaine-positive. May have precipitated the stroke.  Plan: 1. HgbA1c, fasting lipid panel 2. MRI, MRA of the brain without contrast 3. PT consult, OT consult, Speech consult 4. Echocardiogram 5. Carotid dopplers 6. Prophylactic therapy- Add Plavix to ASA 7. Risk factor modification 8. Telemetry monitoring 9. Frequent neuro checks 10. Continue atorvastatin 11. BP management as per standard protocol. Out of permissive HTN time window   @Electronically  signed: Dr. Kerney Elbe  04/18/2018, 11:34 PM

## 2018-04-18 NOTE — ED Notes (Addendum)
Second attempt to call report to floor nurse, no response received. Admitting provider in pt room.

## 2018-04-18 NOTE — ED Notes (Signed)
Attempted to call report, Floor nurse involved in patient care, stated will call back for report

## 2018-04-18 NOTE — H&P (Addendum)
TRH H&P   Patient Demographics:    Renard Caperton, is a 62 y.o. male  MRN: [id number]   DOB - [birth year]  Admit Date - 04/18/2018  Outpatient Primary MD for the patient is System, Provider Not In  Referring MD/NP/PA:   Hipolito Bayley  Outpatient Specialists:    Patient coming from: home  Chief Complaint  Patient presents with  . Weakness    left sided  . Dizziness      HPI:    Javyn Havlin  is a 61 y.o. male, w  Hypertension, hyperlipidemia, dm2, CAD s/p stent , CVA , apparently presents with c/o dizziness then nite before after supper around 8pm and then apparently woke up this morning with left arm weakness.  Pt states that his symptoms felt like prior stroke symptoms and therefore presented to ED. Pt denies headache, vision change, slurred speech, cp, palp, n/v, diarrhea, brbpr, black stool   In ED,  CT brain IMPRESSION: Atrophy and chronic microvascular ischemia.  Empty sella. No acute abnormality.  Na 138, K 4.0 Bun 9 Creatinine 0.79    Wbc 10.7 Hgb 12.5, Plt 308  Pt will be admitted for presumed CVA, and anemia.    Review of systems:    In addition to the HPI above,    No Fever-chills, No Headache, No changes with Vision or hearing, No problems swallowing food or Liquids, No Chest pain, Cough or Shortness of Breath, No Abdominal pain, No Nausea or Vommitting, Bowel movements are regular, No Blood in stool or Urine, No dysuria, No new skin rashes or bruises, No new joints pains-aches,   No recent weight gain or loss, No polyuria, polydypsia or polyphagia, No significant Mental Stressors.  A full 10 point Review of Systems was done, except as stated above, all other Review of Systems were negative.   With Past History of the following :    Past Medical History:  Diagnosis Date  . Alcohol abuse   . Coronary artery disease   . Diabetes mellitus    . Hypertension   . Stented coronary artery   . Tobacco abuse       Past Surgical History:  Procedure Laterality Date  . CORONARY STENT PLACEMENT        Social History:     Social History   Tobacco Use  . Smoking status: Current Every Day Smoker    Types: Cigarettes  . Smokeless tobacco: Never Used  Substance Use Topics  . Alcohol use: Yes     Lives - at home  Mobility - walks by self   Family History :     Family History  Problem Relation Age of Onset  . Hypertension Mother   . Diabetes Mother   . Hypertension Father   . Diabetes Father   . Hypertension Sister   . Hypertension Brother   . Diabetes Sister   .  Diabetes Brother       Home Medications:   Prior to Admission medications   Medication Sig Start Date End Date Taking? Authorizing Provider  acetaminophen (TYLENOL) 500 MG tablet Take 1,000 mg by mouth as needed for mild pain.   Yes [provider]  aspirin 325 MG tablet Take 1 tablet (325 mg total) by mouth daily. 04/24/15  Yes Penny PiaVega, [place], MD  atorvastatin (LIPITOR) 80 MG tablet Take 1 tablet (80 mg total) by mouth daily at 6 PM. 04/24/15  Yes Penny PiaVega, [place], MD  glipiZIDE (GLUCOTROL) 10 MG tablet Take 10 mg by mouth 2 (two) times daily before a meal.   Yes [provider]  hydrocortisone cream 1 % Apply 1 application topically as needed for itching (rash).   Yes [provider]  latanoprost (XALATAN) 0.005 % ophthalmic solution Place 1 drop into both eyes at bedtime.   Yes [provider]  Liniments (BLUE-EMU SUPER STRENGTH) CREA Apply 1 application topically as needed (shoulder or back pain).   Yes [provider]  lisinopril (PRINIVIL,ZESTRIL) 10 MG tablet Take 10 mg by mouth daily.   Yes [provider]  metFORMIN (GLUCOPHAGE) 1000 MG tablet Take 1 tablet (1,000 mg total) by mouth 2 (two) times daily with a meal. 07/04/12  Yes Molpus, John, MD  metoprolol tartrate (LOPRESSOR) 25 MG tablet Take 12.5  mg by mouth 2 (two) times daily.   Yes [provider]  omeprazole (PRILOSEC) 20 MG capsule Take 20 mg by mouth daily.   Yes [provider]  amLODipine (NORVASC) 2.5 MG tablet Take 1 tablet (2.5 mg total) by mouth daily. Patient not taking: Reported on 04/21/2015 07/04/12   Molpus, Jonny RuizJohn, MD  sertraline (ZOLOFT) 100 MG tablet Take 1 tablet (100 mg total) by mouth 2 (two) times daily. Patient not taking: Reported on 04/21/2015 07/04/12   Molpus, Jonny RuizJohn, MD     Allergies:     Allergies  Allergen Reactions  . Iodine Anaphylaxis and Swelling    Swells all over     Physical Exam:   Vitals  Blood pressure (!) 177/97, pulse 69, temperature 97.9 F (36.6 C), temperature source Oral, resp. rate 17, SpO2 97 %.   1. General  lying in bed in NAD,  2. Normal affect and insight, Not Suicidal or Homicidal, Awake Alert, Oriented X 3.  3. No F.N deficits, ALL C.Nerves Intact, Strength 5/5 all 4 extremities, Sensation intact all 4 extremities, except see below , Plantars down going on the right and upgoing on the left  4. Ears and Eyes appear Normal, Conjunctivae clear, PERRLA. Moist Oral Mucosa.  5. Supple Neck, No JVD, No cervical lymphadenopathy appriciated, No Carotid Bruits.  6. Symmetrical Chest wall movement, Good air movement bilaterally, CTAB.  7. RRR, No Gallops, Rubs or Murmurs, No Parasternal Heave.  8. Positive Bowel Sounds, Abdomen Soft, No tenderness, No organomegaly appriciated,No rebound -guarding or rigidity.  9.  No Cyanosis, Normal Skin Turgor, No Skin Rash or Bruise.  10. Good muscle tone,  joints appear normal , no effusions, Normal ROM.  11. No Palpable Lymph Nodes in Neck or Axillae   slight weakness of the left hand and arm, 5-/5   Data Review:    CBC Recent Labs  Lab 04/18/18 1340  WBC 10.7*  HGB 12.5*  HCT 37.1*  PLT 308  MCV 99.2  MCH 33.4  MCHC 33.7  RDW 12.9  LYMPHSABS 2.4  MONOABS 0.6  EOSABS 0.2  BASOSABS 0.0    ------------------------------------------------------------------------------------------------------------------  Chemistries  Recent Labs  Lab 04/18/18 1340  NA 138  K 4.0  CL 107  CO2 23  GLUCOSE 170*  BUN 9  CREATININE 0.79  CALCIUM 9.3   ------------------------------------------------------------------------------------------------------------------ CrCl cannot be calculated (Unknown ideal weight.). ------------------------------------------------------------------------------------------------------------------ No results for input(s): TSH, T4TOTAL, T3FREE, THYROIDAB in the last 72 hours.  Invalid input(s): FREET3  Coagulation profile No results for input(s): INR, PROTIME in the last 168 hours. ------------------------------------------------------------------------------------------------------------------- No results for input(s): DDIMER in the last 72 hours. -------------------------------------------------------------------------------------------------------------------  Cardiac Enzymes No results for input(s): CKMB, TROPONINI, MYOGLOBIN in the last 168 hours.  Invalid input(s): CK ------------------------------------------------------------------------------------------------------------------ No results found for: BNP   ---------------------------------------------------------------------------------------------------------------  Urinalysis    Component Value Date/Time   COLORURINE AMBER (A) 04/22/2015 0050   APPEARANCEUR CLEAR 04/22/2015 0050   LABSPEC 1.021 04/22/2015 0050   PHURINE 5.0 04/22/2015 0050   GLUCOSEU 250 (A) 04/22/2015 0050   HGBUR NEGATIVE 04/22/2015 0050   BILIRUBINUR NEGATIVE 04/22/2015 0050   KETONESUR 15 (A) 04/22/2015 0050   PROTEINUR 30 (A) 04/22/2015 0050   UROBILINOGEN 1.0 04/22/2015 0050   NITRITE NEGATIVE 04/22/2015 0050   LEUKOCYTESUR NEGATIVE 04/22/2015 0050     ----------------------------------------------------------------------------------------------------------------   Imaging Results:    Ct Head Wo Contrast  Result Date: 04/18/2018 CLINICAL DATA:  Subacute neuro deficits.  Dizziness. EXAM: CT HEAD WITHOUT CONTRAST TECHNIQUE: Contiguous axial images were obtained from the base of the skull through the vertex without intravenous contrast. COMPARISON:  MRI 04/22/2015 FINDINGS: Brain: Mild atrophy. Mild chronic white matter changes. Negative for acute infarct. Negative for hemorrhage mass or edema. Vascular: Negative for hyperdense vessel Skull: Cystic lesion left parietal bone unchanged from 2016 MRI. No new skull lesion. Sinuses/Orbits: Mucosal edema in the paranasal sinuses. Normal orbit. Other: Enlargement of the sella filled with CSF compatible with empty sella. IMPRESSION: Atrophy and chronic microvascular ischemia.  Empty sella. No acute abnormality. Electronically Signed   By: Marlan Palau M.D.   On: 04/18/2018 15:01    nsr at 73, LAD, poor R progression q in v1-3, t inversion in v5,6, 2, 3, avf    Assessment & Plan:    Principal Problem:   Stroke [facility]) Active Problems:   Hypertension   Diabetes mellitus without complication (HCC)   Anemia   CVA Check MRI/ MRA brain Check carotid ultrasound Check cardiac echo Check hga1c, lipid,  PT/OT, speech consult Cont aspirin, consider plavix, , defer to neurology regarding this.  Permissive hypertension Cont Lipitor 80mg  po qhs Neurology consulted by ED  Hypertension Cont Metoprolol Hold Amlodipine,  Lisinopril for permissive hypertension   Dm2 fsbs ac and qhs, ISS Cont Metformin 1000mg  po bid Cont glipizide 10mg  po bid  Gerd Cont PPI  Anemia Check cbc in am       DVT Prophylaxis  SCDs  AM Labs Ordered, also please review Full Orders  Family Communication: Admission, patients condition and plan of care including tests being ordered have been discussed with the  patient who indicate understanding and agree with the plan and Code Status.  Code Status FULL CODE  Likely DC to  home  Condition GUARDED    Consults called:  Neurology by ED per Dr. Rosalia Hammers  Admission status:  inpatient  Time spent in minutes : 45   Pearson Grippe M.D on 04/18/2018 at 6:46 PM  Between 7am to 7pm - Pager - [phone] . After 7pm go to [email or web address] - password [facility]  [facility] - Office  [phone]

## 2018-04-19 ENCOUNTER — Inpatient Hospital Stay (HOSPITAL_COMMUNITY): Payer: Non-veteran care

## 2018-04-19 ENCOUNTER — Other Ambulatory Visit: Payer: Self-pay

## 2018-04-19 DIAGNOSIS — I6789 Other cerebrovascular disease: Secondary | ICD-10-CM

## 2018-04-19 DIAGNOSIS — I639 Cerebral infarction, unspecified: Secondary | ICD-10-CM

## 2018-04-19 LAB — GLUCOSE, CAPILLARY
GLUCOSE-CAPILLARY: 206 mg/dL — AB (ref 65–99)
GLUCOSE-CAPILLARY: 89 mg/dL (ref 65–99)
Glucose-Capillary: 154 mg/dL — ABNORMAL HIGH (ref 65–99)
Glucose-Capillary: 148 mg/dL — ABNORMAL HIGH (ref 65–99)

## 2018-04-19 LAB — COMPREHENSIVE METABOLIC PANEL
ALT: 13 U/L — ABNORMAL LOW (ref 17–63)
ANION GAP: 10 (ref 5–15)
AST: 17 U/L (ref 15–41)
Albumin: 3.2 g/dL — ABNORMAL LOW (ref 3.5–5.0)
Alkaline Phosphatase: 90 U/L (ref 38–126)
BUN: 10 mg/dL (ref 6–20)
CHLORIDE: 105 mmol/L (ref 101–111)
CO2: 23 mmol/L (ref 22–32)
Calcium: 9.1 mg/dL (ref 8.9–10.3)
Creatinine, Ser: 0.77 mg/dL (ref 0.61–1.24)
GFR calc non Af Amer: >60 mL/min (ref >60)
Glucose, Bld: 167 mg/dL — ABNORMAL HIGH (ref 65–99)
Potassium: 3.9 mmol/L (ref 3.5–5.1)
SODIUM: 138 mmol/L (ref 135–145)
Total Bilirubin: 0.6 mg/dL (ref 0.3–1.2)
Total Protein: 5.8 g/dL — ABNORMAL LOW (ref 6.5–8.1)

## 2018-04-19 LAB — LIPID PANEL
CHOL/HDL RATIO: 2.7 ratio
Cholesterol: 137 mg/dL (ref 0–200)
HDL: 50 mg/dL (ref >40)
LDL Cholesterol: 71 mg/dL (ref 0–99)
Triglycerides: 79 mg/dL (ref <150)
VLDL: 16 mg/dL (ref 0–40)

## 2018-04-19 LAB — CBC
HCT: 37.3 % — ABNORMAL LOW (ref 39.0–52.0)
Hemoglobin: 12.6 g/dL — ABNORMAL LOW (ref 13.0–17.0)
MCH: 34.2 pg — ABNORMAL HIGH (ref 26.0–34.0)
MCHC: 33.8 g/dL (ref 30.0–36.0)
MCV: 101.4 fL — ABNORMAL HIGH (ref 78.0–100.0)
PLATELETS: 243 10*3/uL (ref 150–400)
RBC: 3.68 MIL/uL — ABNORMAL LOW (ref 4.22–5.81)
RDW: 13.2 % (ref 11.5–15.5)
WBC: 11.3 10*3/uL — ABNORMAL HIGH (ref 4.0–10.5)

## 2018-04-19 LAB — HIV ANTIBODY (ROUTINE TESTING W REFLEX): HIV Screen 4th Generation wRfx: NONREACTIVE

## 2018-04-19 LAB — ECHOCARDIOGRAM COMPLETE
HEIGHTINCHES: 66 in
WEIGHTICAEL: 2345.69 [oz_av]

## 2018-04-19 LAB — HEMOGLOBIN A1C
Hgb A1c MFr Bld: 7.1 % — ABNORMAL HIGH (ref 4.8–5.6)
MEAN PLASMA GLUCOSE: 157.07 mg/dL

## 2018-04-19 MED ORDER — SERTRALINE HCL 100 MG PO TABS
100.0000 mg | ORAL_TABLET | Freq: Two times a day (BID) | ORAL | Status: DC
  Administered 2018-04-19 – 2018-04-21 (×5): 100 mg via ORAL
  Filled 2018-04-19 (×5): qty 1

## 2018-04-19 MED ORDER — AMLODIPINE BESYLATE 10 MG PO TABS
10.0000 mg | ORAL_TABLET | Freq: Every day | ORAL | Status: DC
  Administered 2018-04-20 – 2018-04-21 (×2): 10 mg via ORAL
  Filled 2018-04-19 (×2): qty 1

## 2018-04-19 MED ORDER — AMLODIPINE BESYLATE 2.5 MG PO TABS
2.5000 mg | ORAL_TABLET | Freq: Every day | ORAL | Status: DC
  Administered 2018-04-19: 2.5 mg via ORAL
  Filled 2018-04-19: qty 1

## 2018-04-19 MED ORDER — LISINOPRIL 10 MG PO TABS
10.0000 mg | ORAL_TABLET | Freq: Every day | ORAL | Status: DC
  Administered 2018-04-19 – 2018-04-21 (×3): 10 mg via ORAL
  Filled 2018-04-19 (×3): qty 1

## 2018-04-19 MED ORDER — CLOPIDOGREL BISULFATE 75 MG PO TABS
75.0000 mg | ORAL_TABLET | Freq: Every day | ORAL | Status: DC
  Administered 2018-04-19 – 2018-04-21 (×3): 75 mg via ORAL
  Filled 2018-04-19 (×3): qty 1

## 2018-04-19 NOTE — Progress Notes (Signed)
Patient received alert and oriented x 4, able to make needs known. Skin assessment done by this nurse and Oletha Cruel. On head to toe assessment no pressure ulcer noted, patient was able to walk from stretcher to bed with standby assist, this nurse oriented patient to unit and how to use call light when assistance is needed.

## 2018-04-19 NOTE — Progress Notes (Signed)
  Echocardiogram 2D Echocardiogram has been performed.  Celene Skeen 04/19/2018, 3:42 PM

## 2018-04-19 NOTE — Progress Notes (Signed)
Subjective: Dizziness, left sided weakness  Objective: Current vital signs: BP (!) 150/74 (BP Location: Left Arm)   Pulse (!) 58   Temp 97.9 F (36.6 C) (Oral)   Resp 18   Ht  (1.676 m)   Wt 66.5 kg (146 lb 9.7 oz)   SpO2 98%   BMI 23.66 kg/m  Vital signs in last 24 hours: Temp:  [97.3 F (36.3 C)-98.7 F (37.1 C)] 97.9 F (36.6 C) (04/28 0854) Pulse Rate:  [57-70] 58 (04/28 0854) Resp:  [18-22] 18 (04/28 0422) BP: (136-195)/(61-92) 150/74 (04/28 0854) SpO2:  [96 %-100 %] 98 % (04/28 0854)  Intake/Output from previous day: 04/27 0701 - 04/28 0700 In: 240 [P.O.:240] Out: 225 [Urine:225] Intake/Output this shift: No intake/output data recorded. Nutritional status: Fall precautions Diet heart healthy/carb modified Room service appropriate? Yes; Fluid consistency: Thin   Neurological exam alert and oriented x3 Equal and reactive light accommodation, extraocular movements are intact, left facial droop upon smiling, sensation of the face intact strong shoulder shrug Motor exam left upper extremity 4/5, 5/5 lower extremity 5/5 right side is within normal limits Finger-to-nose test on the right side is intact, left side shows mild dysmetria Speech very mild dysarthria       Lab Results: Basic Metabolic Panel: Recent Labs  Lab 04/18/18 1340 04/19/18 0605 04/20/18 0903  NA 138 138 136  K 4.0 3.9 3.9  CL 107 105 105  CO2 GLUCOSE 170* 167* 116*  BUN CREATININE 0.79 0.77 0.82  CALCIUM 9.3 9.1 9.0    Liver Function Tests: Recent Labs  Lab 04/18/18 2153 04/19/18 0605  AST 13* 17  ALT 13* 13*  ALKPHOS 93 90  BILITOT 0.6 0.6  PROT 6.1* 5.8*  ALBUMIN 3.3* 3.2*   No results for input(s): AMMONIA in the last 168 hours.  CBC: Recent Labs  Lab 04/18/18 1340 04/19/18 0605 04/20/18 0903  WBC 10.7* 11.3* 15.5*  NEUTROABS 7.5  --  12.8*  HGB 12.5* 12.6* 12.3*  HCT 37.1* 37.3* 35.9*  MCV 99.2 101.4* 99.4  PLT 308 243 281       Lipid Panel: Recent Labs  Lab 04/19/18 0605  CHOL 137  TRIG 79  HDL 50  CHOLHDL 2.7  VLDL 16  LDLCALC 71    CBG: Recent Labs  Lab 04/19/18 0610 04/19/18 1320 04/19/18 1547 04/19/18 2123 04/20/18 0559  GLUCAP 154* 148* 206* 89 168*      Imaging: Dg Chest 2 View  Result Date: 04/19/2018 CLINICAL DATA:  Dizziness EXAM: CHEST - 2 VIEW COMPARISON:  07/03/2012 FINDINGS: Mild cardiomegaly with minimal aortic atherosclerosis. No aneurysm. No acute pulmonary consolidation, effusion or pneumothorax. Chronic mild interstitial prominence redemonstrated. No overt pulmonary edema. No acute osseous abnormality. IMPRESSION: Stable mild cardiomegaly with aortic atherosclerosis. No active pulmonary disease. Electronically Signed   By: Tollie Eth M.D.   On: 04/19/2018 17:46   Ct Head Wo Contrast  Result Date: 04/18/2018 CLINICAL DATA:  Subacute neuro deficits.  Dizziness. EXAM: CT HEAD WITHOUT CONTRAST TECHNIQUE: Contiguous axial images were obtained from the base of the skull through the vertex without intravenous contrast. COMPARISON:  MRI 04/22/2015 FINDINGS: Brain: Mild atrophy. Mild chronic white matter changes. Negative for acute infarct. Negative for hemorrhage mass or edema. Vascular: Negative for hyperdense vessel Skull: Cystic lesion left parietal bone unchanged from 2016 MRI. No new skull lesion. Sinuses/Orbits: Mucosal edema in the paranasal sinuses. Normal orbit. Other: Enlargement of the sella filled with  CSF compatible with empty sella. IMPRESSION: Atrophy and chronic microvascular ischemia.  Empty sella. No acute abnormality. Electronically Signed   By: Marlan Palau M.D.   On: 04/18/2018 15:01   [name]  Result Date: 04/19/2018 CLINICAL DATA:  Focal neuro deficit, stroke suspected. Dizziness, LEFT upper extremity weakness, and gait instability,. Past medical history includes alcohol abuse, diabetes, hypertension, tobacco abuse, and coronary artery disease.  EXAM: MRI HEAD WITHOUT CONTRAST TECHNIQUE: Multiplanar, multiecho pulse sequences of the brain and surrounding structures were obtained without intravenous contrast. COMPARISON:  04/18/2018. FINDINGS: The patient was claustrophobic, and would not allow further scanning, despite best efforts of the technologist. Brain: Axial diffusion weighted imaging demonstrates an acute lenticulostriate infarct, RIGHT-sided, involving posterior limb internal capsule, posterior lentiform nucleus, and periventricular white matter. I am unable to assess for hemorrhage, additional mass lesion, or extra-axial fluid. No visible hydrocephalus. Vascular: Not assessed. Skull and upper cervical spine: Not assessed. Sinuses/Orbits: Not assessed. Other: Not assessed. Compared with CT scan performed yesterday, the infarct is not visible. IMPRESSION: Only a single axial diffusion-weighted pulse sequence was obtained, following which the patient refused to allow further scanning. Findings consistent with acute RIGHT lenticulostriate territory infarct. See discussion above. Electronically Signed   By: Elsie Stain M.D.   On: 04/19/2018 13:03    Current Facility-Administered Medications (Endocrine & Metabolic):  .  glipiZIDE (GLUCOTROL) tablet 10 mg .  insulin aspart (novoLOG) injection 0-5 Units .  insulin aspart (novoLOG) injection 0-9 Units .  metFORMIN (GLUCOPHAGE) tablet 1,000 mg   Current Facility-Administered Medications (Cardiovascular):  .  amLODipine (NORVASC) tablet 10 mg .  atorvastatin (LIPITOR) tablet 80 mg .  lisinopril (PRINIVIL,ZESTRIL) tablet 10 mg .  metoprolol tartrate (LOPRESSOR) tablet 12.5 mg     Current Facility-Administered Medications (Analgesics):  .  acetaminophen (TYLENOL) tablet 650 mg **OR** acetaminophen (TYLENOL) solution 650 mg **OR** acetaminophen (TYLENOL) suppository 650 mg .  aspirin suppository 300 mg **OR** aspirin tablet 325 mg   Current Facility-Administered Medications  (Hematological):  .  clopidogrel (PLAVIX) tablet 75 mg   Current Facility-Administered Medications (Other):  .  latanoprost (XALATAN) 0.005 % ophthalmic solution 1 drop .  pantoprazole (PROTONIX) EC tablet 40 mg .  sertraline (ZOLOFT) tablet 100 mg  No current outpatient medications on file.  Assessment: 62 y.o. male presenting with acute onset of dizziness and left-sided weakness. Likely to be ischemic stroke Associated risk factors, DM, cocaine, HTN, CAD  MRI brain shows right paraventricular stroke, MRA head showed mild signs of small vessel disease, echocardiogram showed normal ejection fraction.  Continue Aspirin and Statin therapy for long term stroke prevention. Recommend optimum control of DM and HTN  Will obtain carotid Doppler for further management  Tobacco and cocaine cessation. Findings discussed with patient in detail          Felicie Morn PA-C [facility] [phone]  04/20/2018, 11:25 AM

## 2018-04-19 NOTE — Evaluation (Signed)
Speech Language Pathology Evaluation Patient Details Name: William Gonzalez MRN: [id number] DOB: August 27, 1956 Today's Date: 04/19/2018 Time: 7829-5621 SLP Time Calculation (min) (ACUTE ONLY): 28 min  Problem List:  Patient Active Problem List   Diagnosis Date Noted  . Anemia 04/18/2018  . Ataxic gait   . Cocaine abuse (HCC)   . Cerebellar infarct (HCC) 04/21/2015  . Tobacco abuse 04/21/2015  . Alcohol abuse 04/21/2015  . Stroke (HCC) 04/21/2015  . Diabetes mellitus without complication (HCC) 04/21/2015  . Coronary artery disease   . Hypertension    Past Medical History:  Past Medical History:  Diagnosis Date  . Alcohol abuse   . Coronary artery disease   . Diabetes mellitus   . Hypertension   . Stented coronary artery   . Tobacco abuse    Past Surgical History:  Past Surgical History:  Procedure Laterality Date  . CORONARY STENT PLACEMENT     HPI:  62 yo male adm to [facility] with ataxia and left upper extremity weakness.  Pt found to have left middle cerebellar peduncle infarct 04/20/2018.  Speech evaluation ordered.  Pt reports he tends to "piddle" at home but does not have to do chores, cooking or cleaning.  He states he lives with a significant other.     Assessment / Plan / Recommendation Clinical Impression  Patient given MOCA 7.1 with score of 24/30 - areas of difficulties included memory and verbal fluency.  However session was interrupted multiple times during our session and suspect his impacted pt's scoring.  He was able to name words with category or multiple choice cues indicating ability to store information - retrieval deficits noted.  Pt also with left facial nerve deficits with resultant facial droop.  SLP provided pt with compensation strategies for memory in writing and verbally.   Thanks for this consult.      SLP Assessment  SLP Recommendation/Assessment: Patient does not need any further Speech Lanaguage Pathology Services SLP Visit Diagnosis: Cognitive  communication deficit (R41.841)    Follow Up Recommendations  None    Frequency and Duration   n/a        SLP Evaluation Cognition  Overall Cognitive Status: No family/caregiver present to determine baseline cognitive functioning Arousal/Alertness: Awake/alert Orientation Level: Oriented X4 Attention: Sustained;Selective Sustained Attention: Appears intact Selective Attention: Appears intact Memory: Impaired Memory Impairment: Retrieval deficit Awareness: Appears intact Problem Solving: Appears intact(aware of left sided weakness, need to call for assist) Comments: pt required category or multiple choice cues for memory recall with all 5 words       Comprehension  Auditory Comprehension Overall Auditory Comprehension: Appears within functional limits for tasks assessed Yes/No Questions: Within Functional Limits Commands: Within Functional Limits Conversation: Simple Interfering Components: Attention Visual Recognition/Discrimination Discrimination: Within Function Limits Reading Comprehension Reading Status: Within funtional limits    Expression Expression Primary Mode of Expression: Verbal Verbal Expression Overall Verbal Expression: Appears within functional limits for tasks assessed Initiation: No impairment Repetition: No impairment Naming: No impairment Pragmatics: No impairment Written Expression Dominant Hand: Right Written Expression: Within Functional Limits   Oral / Motor  Oral Motor/Sensory Function Overall Oral Motor/Sensory Function: Within functional limits Motor Speech Overall Motor Speech: Appears within functional limits for tasks assessed Respiration: Within functional limits Phonation: Normal Resonance: Within functional limits Articulation: Within functional limitis Intelligibility: Intelligible Motor Planning: Witnin functional limits Motor Speech Errors: Not applicable   GO  Chales Abrahams 04/19/2018, 11:29  AM  Donavan Burnet, MS [facility] SLP [phone]

## 2018-04-19 NOTE — Evaluation (Signed)
Occupational Therapy Evaluation Patient Details Name: William Gonzalez MRN: [id number] DOB: [birth year] Today's Date: 04/19/2018    History of Present Illness William Gonzalez  is a 62 y.o. male, w  Hypertension, hyperlipidemia, dm2, CAD s/p stent , CVA , apparently presents with c/o dizziness then nite before after supper around 8pm and then apparently woke up this morning with left arm weakness.  Pt states that his symptoms felt like prior stroke symptoms and therefore presented to ED. Pt denies headache, vision change, slurred speech, cp, palp, n/v, diarrhea, brbpr, black stool   Clinical Impression   Pt with decline in function and safety with decreased L UE strength and coordination, decreased balance and endurance. Pt lives at home with a friend and the friend's girlfriend and states that his friend can help and that the [place] has been providing an Engineer, production for home mgt. Pt does not drive and pt did not report any dizziness with sit - stand or ambulation. Pt unsteady with slow guarded pace during ambulation and ADL mobility. Used RW initially from bed to sink, then 1 person hand held assist to bathroom and to recliner. Pt would benefit from acute OT services to address impairments to maximize level of function and safety    Follow Up Recommendations  Home health OT (depending on acute stay progress);Supervision - Intermittent    Equipment Recommendations  Tub/shower seat    Recommendations for Other Services       Precautions / Restrictions Precautions Precautions: Fall Restrictions Weight Bearing Restrictions: No      Mobility Bed Mobility Overal bed mobility: Modified Independent                Transfers Overall transfer level: (pt did not report any dizziness with sit - stand or ambulation) Equipment used: Rolling walker (2 wheeled);1 person hand held assist Transfers: Sit to/from [employer] Sit to Stand: Min guard Stand pivot transfers: Min guard        General transfer comment: pt did not report any dizziness with sit - stand or ambulation. Pt unsteady with slow guarded pace during ambulation and ADL mobility. Used RW initially from bed to sink, then 1 person hand held assist to bathroom and to recliner    Balance Overall balance assessment: Needs assistance Sitting-balance support: No upper extremity supported;Feet supported Sitting balance-Leahy Scale: Good     Standing balance support: Single extremity supported;Bilateral upper extremity supported;During functional activity Standing balance-Leahy Scale: Poor Standing balance comment: Pt unsteady and L lean with dynamic functional mobility on and off toilet and stepping in and out of shower. Required use of grab bars to safely transfers                           ADL either performed or assessed with clinical judgement   ADL Overall ADL's : Needs assistance/impaired     Grooming: Wash/dry hands;Wash/dry face;Standing;Min guard   Upper Body Bathing: Min guard;Standing   Lower Body Bathing: Min guard;Sit to/from stand;Cueing for safety;Cueing for sequencing   Upper Body Dressing : Min guard;Standing   Lower Body Dressing: Min guard;Minimal assistance;Sit to/from stand;Cueing for safety;Cueing for sequencing   Toilet Transfer: Min guard;Ambulation;Grab bars;Comfort height toilet;Cueing for safety;Cueing for sequencing   Toileting- Clothing Manipulation and Hygiene: Min guard;Sit to/from stand   Tub/ Shower Transfer: Minimal assistance;Ambulation;Grab bars;3 in 1   Functional mobility during ADLs: Minimal assistance;Min guard;Cueing for safety;Cueing for sequencing General ADL Comments: Pt unsteady and L lean  with dynamic functional mobility with ADLs in standing and sit - stand transitions, on and off toilet and stepping in and out of shower. Required use of grab bars to safely transfers     Vision Patient Visual Report: No change from baseline Additional  Comments: pt reported blurred vision upon admission, however reports that it has resolved      Perception     Praxis      Pertinent Vitals/Pain Pain Assessment: No/denies pain     Hand Dominance Right   Extremity/Trunk Assessment Upper Extremity Assessment Upper Extremity Assessment: LUE deficits/detail LUE Deficits / Details: 3/5 grossly, has functional grip and strength LUE Coordination: decreased fine motor   Lower Extremity Assessment Lower Extremity Assessment: Defer to PT evaluation   Cervical / Trunk Assessment Cervical / Trunk Assessment: Normal   Communication Communication Communication: No difficulties   Cognition Arousal/Alertness: Awake/alert Behavior During Therapy: WFL for tasks assessed/performed Overall Cognitive Status: No family/caregiver present to determine baseline cognitive functioning                                     General Comments  min  - mni guard A for balance/safety during ADL tasks sit - stand and in standing    Exercises Other Exercises Other Exercises: L UE ROM and grip exercises with squeezing wash rag at sink   Shoulder Instructions      Home Living Family/patient expects to be discharged to:: Private residence Living Arrangements: Other (Comment)(friend) Available Help at Discharge: Available 24 hours/day Type of Home: House Home Access: Level entry     Home Layout: One level     Bathroom Shower/Tub: Chief Strategy Officer: Standard     Home Equipment: None          Prior Functioning/Environment Level of Independence: Independent        Comments: does not drive        OT Problem List: Decreased strength;Decreased activity tolerance;Decreased knowledge of use of DME or AE;Impaired tone;Impaired balance (sitting and/or standing);Decreased coordination      OT Treatment/Interventions: Self-care/ADL training;DME and/or AE instruction;Therapeutic activities;Balance  training;Therapeutic exercise;Neuromuscular education;Patient/family education    OT Goals(Current goals can be found in the care plan section) Acute Rehab OT Goals Patient Stated Goal: get well and go home OT Goal Formulation: With patient Time For Goal Achievement: 05/03/18 Potential to Achieve Goals: Good ADL Goals Pt Will Perform Grooming: with supervision;with set-up;standing Pt Will Perform Upper Body Bathing: with supervision;with set-up;standing Pt Will Perform Lower Body Bathing: with supervision;with set-up;sit to/from stand Pt Will Perform Upper Body Dressing: with supervision;with set-up;standing Pt Will Perform Lower Body Dressing: with min guard assist;with supervision;with set-up;sit to/from stand Pt Will Transfer to Toilet: with supervision;ambulating Pt Will Perform Toileting - Clothing Manipulation and hygiene: with supervision;sit to/from stand Pt Will Perform Tub/Shower Transfer: with min guard assist;with supervision;ambulating;shower seat;3 in 1;grab bars Pt/caregiver will Perform Home Exercise Program: Increased ROM;Increased strength;Left upper extremity;With theraband;With theraputty;With Supervision  OT Frequency: Min 2X/week   Barriers to D/C:    no barriers       Co-evaluation              AM-PAC PT "6 Clicks" Daily Activity     Outcome Measure Help from another person eating meals?: None Help from another person taking care of personal grooming?: A Little Help from another person toileting, which includes using toliet, bedpan, or urinal?:  A Little Help from another person bathing (including washing, rinsing, drying)?: A Little Help from another person to put on and taking off regular upper body clothing?: A Little Help from another person to put on and taking off regular lower body clothing?: A Little 6 Click Score: 19   End of Session Equipment Utilized During Treatment: Gait belt  Activity Tolerance: Patient tolerated treatment well Patient  left: in chair;with call bell/phone within reach;with chair alarm set  OT Visit Diagnosis: Unsteadiness on feet (R26.81);Muscle weakness (generalized) (M62.81);Other abnormalities of gait and mobility (R26.89)                Time: 1610-9604 OT Time Calculation (min): 38 min Charges:  OT General Charges $OT Visit: 1 Visit OT Evaluation $OT Eval Moderate Complexity: 1 Mod OT Treatments $Self Care/Home Management : 8-22 mins $Therapeutic Activity: 8-22 mins G-Codes: OT G-codes **NOT FOR INPATIENT CLASS** Functional Assessment Tool Used: AM-PAC 6 Clicks Daily Activity     Galen Manila 04/19/2018, 11:47 AM

## 2018-04-19 NOTE — Progress Notes (Signed)
PROGRESS NOTE    William Gonzalez  ZOX:[id number] DOB: 11-21-1956 DOA: 04/18/2018 PCP: System, Provider Not In   Brief Narrative: Patient is a 35 male with past medical history of hypertension, hyperlipidemia, diabetes type 2, coronary artery disease status post stent, CVA who presents to the emergency department with complaints of dizziness and left arm weakness.  Patient has history of a stroke in the past.  CT brain on presentation did not show any acute intra-cranial abnormalities.  Neurology consulted.  Patient waiting for MRI/MRI of the brain.  Assessment & Plan:   Principal Problem:   Stroke [name] Active Problems:   Hypertension   Diabetes mellitus without complication (HCC)   Anemia  Possible acute CVA: History of stroke in the past.  CT head on presentation did not show any acute  Abnormalities. Patient is weak on his left side.  We will follow-up MRI/MRA of the brain, carotid Doppler,  echocardiogram. Hemoglobin A1c of 7.1. Continue aspirin and Plavix. Follow-up with PT/OT/speech. Continue Lipitor.  Neurology following  Hypertension: On metoprolol amlodipine and lisinopril at home.  Will resume these.    Diabetes type 2: On metformin 1 g twice daily, glipizide 10 mg twice daily at home.  Continue sliding-scale insulin here.  Elevated hemoglobin A1c most likely due to noncompliance.  Will reinforce on compliance.  GERD: Continue PPI.    Smoker: Counseled for smoking cessation.  Also smokes cocaine.  Counseled against that.  Anemia: Currently H and H stable.  Leucocytosis: Most likely reactive.  We will continue to monitor    DVT prophylaxis: SCD Code Status: Full Family Communication: None present at the bedside Disposition Plan: Pending PT evaluation   Consultants: Neurology  Procedures: None  Antimicrobials: None  Subjective:  Patient seen and examined the bedside this morning.  Remains comfortable.  No new issues/events.  Weaker on the left  side. Objective: Vitals:   04/19/18 0348 04/19/18 0623 04/19/18 0814 04/19/18 1326  BP: (!) 156/66 (!) 156/80 (!) 154/68 (!) 186/92  Pulse: (!) 56 68 70 70  Resp: Temp: 97.8 F (36.6 C) 98.3 F (36.8 C) 98.4 F (36.9 C)   TempSrc: Oral Oral Axillary   SpO2: 100% 100% 99% 100%  Weight:      Height:        Intake/Output Summary (Last 24 hours) at 04/19/2018 1403 Last data filed at 04/19/2018 0636 Gross per 24 hour  Intake 120 ml  Output 480 ml  Net -360 ml   Filed Weights   04/18/18 1930  Weight: 66.5 kg (146 lb 9.7 oz)    Examination:  General exam: Appears calm and comfortable ,Not in distress,average built HEENT:PERRL,Oral mucosa moist, Ear/Nose normal on gross exam Respiratory system: Bilateral equal air entry, normal vesicular breath sounds, no wheezes or crackles  Cardiovascular system: S1 & S2 heard, RRR. No JVD, murmurs, rubs, gallops or clicks. No pedal edema. Gastrointestinal system: Abdomen is nondistended, soft and nontender. No organomegaly or masses felt. Normal bowel sounds heard. Central nervous system: Alert and oriented.  Power 3/ 5 on left upper extremity, power 4/5 on left lower extremity.  Right side normal Extremities: No edema, no clubbing ,no cyanosis, distal peripheral pulses palpable. Skin: No rashes, lesions or ulcers,no icterus ,no pallor MSK: Normal muscle bulk,tone ,power Psychiatry: Judgement and insight appear normal. Mood & affect appropriate.     Data Reviewed: I have personally reviewed following labs and imaging studies  CBC: Recent Labs  Lab 04/18/18 1340 04/19/18 8119  WBC 10.7* 11.3*  NEUTROABS 7.5  --   HGB 12.5* 12.6*  HCT 37.1* 37.3*  MCV 99.2 101.4*  PLT 308 243   Basic Metabolic Panel: Recent Labs  Lab 04/18/18 1340 04/19/18 0605  NA 138 138  K 4.0 3.9  CL 107 105  CO2 23 23  GLUCOSE 170* 167*  BUN 9 10  CREATININE 0.79 0.77  CALCIUM 9.3 9.1   GFR: Estimated Creatinine Clearance: 84.2 mL/min  (by C-G formula based on SCr of 0.77 mg/dL). Liver Function Tests: Recent Labs  Lab 04/18/18 2153 04/19/18 0605  AST 13* 17  ALT 13* 13*  ALKPHOS 93 90  BILITOT 0.6 0.6  PROT 6.1* 5.8*  ALBUMIN 3.3* 3.2*   No results for input(s): LIPASE, AMYLASE in the last 168 hours. No results for input(s): AMMONIA in the last 168 hours. Coagulation Profile: No results for input(s): INR, PROTIME in the last 168 hours. Cardiac Enzymes: No results for input(s): CKTOTAL, CKMB, CKMBINDEX, TROPONINI in the last 168 hours. BNP (last 3 results) No results for input(s): PROBNP in the last 8760 hours. HbA1C: Recent Labs    04/19/18 0605  HGBA1C 7.1*   CBG: Recent Labs  Lab 04/18/18 2136 04/19/18 0610 04/19/18 1320  GLUCAP 211* 154* 148*   Lipid Profile: Recent Labs    04/19/18 0605  CHOL 137  HDL 50  LDLCALC 71  TRIG 79  CHOLHDL 2.7   Thyroid Function Tests: No results for input(s): TSH, T4TOTAL, FREET4, T3FREE, THYROIDAB in the last 72 hours. Anemia Panel: No results for input(s): VITAMINB12, FOLATE, FERRITIN, TIBC, IRON, RETICCTPCT in the last 72 hours. Sepsis Labs: No results for input(s): PROCALCITON, LATICACIDVEN in the last 168 hours.  No results found for this or any previous visit (from the past 240 hour(s)).       Radiology Studies: Ct Head Gonzalez Contrast  Result Date: 04/18/2018 CLINICAL DATA:  Subacute neuro deficits.  Dizziness. EXAM: CT HEAD WITHOUT CONTRAST TECHNIQUE: Contiguous axial images were obtained from the base of the skull through the vertex without intravenous contrast. COMPARISON:  MRI 04/22/2015 FINDINGS: Brain: Mild atrophy. Mild chronic white matter changes. Negative for acute infarct. Negative for hemorrhage mass or edema. Vascular: Negative for hyperdense vessel Skull: Cystic lesion left parietal bone unchanged from 2016 MRI. No new skull lesion. Sinuses/Orbits: Mucosal edema in the paranasal sinuses. Normal orbit. Other: Enlargement of the sella  filled with CSF compatible with empty sella. IMPRESSION: Atrophy and chronic microvascular ischemia.  Empty sella. No acute abnormality. Electronically Signed   By: Marlan Palau M.D.   On: 04/18/2018 15:01   William Gonzalez Contrast  Result Date: 04/19/2018 CLINICAL DATA:  Focal neuro deficit, stroke suspected. Dizziness, LEFT upper extremity weakness, and gait instability,. Past medical history includes alcohol abuse, diabetes, hypertension, tobacco abuse, and coronary artery disease. EXAM: MRI HEAD WITHOUT CONTRAST TECHNIQUE: Multiplanar, multiecho pulse sequences of the brain and surrounding structures were obtained without intravenous contrast. COMPARISON:  04/18/2018. FINDINGS: The patient was claustrophobic, and would not allow further scanning, despite best efforts of the technologist. Brain: Axial diffusion weighted imaging demonstrates an acute lenticulostriate infarct, RIGHT-sided, involving posterior limb internal capsule, posterior lentiform nucleus, and periventricular white matter. I am unable to assess for hemorrhage, additional mass lesion, or extra-axial fluid. No visible hydrocephalus. Vascular: Not assessed. Skull and upper cervical spine: Not assessed. Sinuses/Orbits: Not assessed. Other: Not assessed. Compared with CT scan performed yesterday, the infarct is not visible. IMPRESSION: Only a single axial diffusion-weighted pulse sequence was  obtained, following which the patient refused to allow further scanning. Findings consistent with acute RIGHT lenticulostriate territory infarct. See discussion above. Electronically Signed   By: Elsie Stain M.D.   On: 04/19/2018 13:03        Scheduled Meds: . aspirin  300 mg Rectal Daily   Or  . aspirin  325 mg Oral Daily  . atorvastatin  80 mg Oral q1800  . glipiZIDE  10 mg Oral BID AC  . insulin aspart  0-5 Units Subcutaneous QHS  . insulin aspart  0-9 Units Subcutaneous TID WC  . latanoprost  1 drop Both Eyes QHS  . metFORMIN  1,000 mg  Oral BID WC  . metoprolol tartrate  12.5 mg Oral BID  . pantoprazole  40 mg Oral Daily   Continuous Infusions:   LOS: 1 day    Time spent: 25 mins.More than 50% of that time was spent in counseling and/or coordination of care.      Burnadette Pop, MD [facility] Pager [phone]  If 7PM-7AM, please contact night-coverage [email or web address] Password [facility] 04/19/2018, 2:03 PM

## 2018-04-19 NOTE — Progress Notes (Signed)
4/27 @ 1220. Pt refused to continue scanning. Limited exam only.  RN aware.

## 2018-04-19 NOTE — Progress Notes (Signed)
Bilateral carotid duplex completed. Preliminary results - 1% to 39% ICA stenosis bilaterally. Vertebral artery flow is antegrade. [employer], RVS 04/19/2018 9:16 AM

## 2018-04-19 NOTE — Progress Notes (Signed)
PT Cancellation Note  Patient Details Name: William Gonzalez MRN: [id number] DOB: Nov 13, 1956   Cancelled Treatment:    Reason Eval/Treat Not Completed: Fatigue/lethargy limiting ability to participate Pt reporting he just finished with multiple tests and had worked with OT, and was too tired to participate with PT. Reports he would like to wait until tomorrow to work with PT. Will follow up as schedule allows.   Gladys Damme, PT, DPT  Acute Rehabilitation Services  Pager: [phone]   [name] 04/19/2018, 3:21 PM

## 2018-04-20 ENCOUNTER — Encounter (HOSPITAL_COMMUNITY): Payer: Non-veteran care

## 2018-04-20 LAB — BASIC METABOLIC PANEL
Anion gap: 6 (ref 5–15)
BUN: 11 mg/dL (ref 6–20)
CO2: 25 mmol/L (ref 22–32)
CREATININE: 0.82 mg/dL (ref 0.61–1.24)
Calcium: 9 mg/dL (ref 8.9–10.3)
Chloride: 105 mmol/L (ref 101–111)
GFR calc Af Amer: >60 mL/min (ref >60)
Glucose, Bld: 116 mg/dL — ABNORMAL HIGH (ref 65–99)
Potassium: 3.9 mmol/L (ref 3.5–5.1)
SODIUM: 136 mmol/L (ref 135–145)

## 2018-04-20 LAB — CBC WITH DIFFERENTIAL/PLATELET
Basophils Absolute: 0 10*3/uL (ref 0.0–0.1)
Basophils Relative: 0 %
EOS ABS: 0.1 10*3/uL (ref 0.0–0.7)
EOS PCT: 1 %
HCT: 35.9 % — ABNORMAL LOW (ref 39.0–52.0)
Hemoglobin: 12.3 g/dL — ABNORMAL LOW (ref 13.0–17.0)
LYMPHS ABS: 1.6 10*3/uL (ref 0.7–4.0)
Lymphocytes Relative: 10 %
MCH: 34.1 pg — ABNORMAL HIGH (ref 26.0–34.0)
MCHC: 34.3 g/dL (ref 30.0–36.0)
MCV: 99.4 fL (ref 78.0–100.0)
MONO ABS: 1.1 10*3/uL — AB (ref 0.1–1.0)
MONOS PCT: 7 %
Neutro Abs: 12.8 10*3/uL — ABNORMAL HIGH (ref 1.7–7.7)
Neutrophils Relative %: 82 %
PLATELETS: 281 10*3/uL (ref 150–400)
RBC: 3.61 MIL/uL — ABNORMAL LOW (ref 4.22–5.81)
RDW: 12.9 % (ref 11.5–15.5)
WBC: 15.5 10*3/uL — ABNORMAL HIGH (ref 4.0–10.5)

## 2018-04-20 LAB — GLUCOSE, CAPILLARY
GLUCOSE-CAPILLARY: 168 mg/dL — AB (ref 65–99)
Glucose-Capillary: 64 mg/dL — ABNORMAL LOW (ref 65–99)
Glucose-Capillary: 81 mg/dL (ref 65–99)

## 2018-04-20 MED ORDER — ENALAPRILAT 1.25 MG/ML IV SOLN
0.6250 mg | Freq: Once | INTRAVENOUS | Status: AC
  Administered 2018-04-20: 0.625 mg via INTRAVENOUS
  Filled 2018-04-20: qty 0.5

## 2018-04-20 MED ORDER — METOPROLOL TARTRATE 5 MG/5ML IV SOLN
5.0000 mg | Freq: Once | INTRAVENOUS | Status: AC
  Administered 2018-04-20: 5 mg via INTRAVENOUS
  Filled 2018-04-20: qty 5

## 2018-04-20 NOTE — Plan of Care (Signed)
Discussed with the pt the importance of the mri/mra, however, pt continued to refuse the testing. Dr is aware

## 2018-04-20 NOTE — Progress Notes (Signed)
0.625 mg Vasotec being administered 1x for SBP in 180's. HR in 60's so IV metoprolol not an optimal choice.   Electronically signed: Dr. Caryl Pina

## 2018-04-20 NOTE — Progress Notes (Signed)
Occupational Therapy Treatment Patient Details Name: William Gonzalez MRN: [id number] DOB: 1956/01/24 Today's Date: 04/20/2018    History of present illness William Gonzalez  is a 62 y.o. male, w  Hypertension, hyperlipidemia, dm2, CAD s/p stent , CVA , apparently presents with c/o dizziness then nite before after supper around 8pm and then apparently woke up this morning with left arm weakness.  Pt states that his symptoms felt like prior stroke symptoms and therefore presented to ED. Pt denies headache, vision change, slurred speech, cp, palp, n/v, diarrhea, brbpr, black stool   OT comments  Pt. Able to complete in room ambulation to/from b.room for toileting tasks.  Noted LOB X1 to the left in the b.room with therapist assistance for correction.  Focus on L UE neglect as pt. required max cues to protect LUE during functional mobility and ADL completion.  Cont. With current POC with focus on increasing safety and independence with ADLS prior to d/c home  Follow Up Recommendations  Home health OT;Supervision - Intermittent    Equipment Recommendations  Tub/shower seat    Recommendations for Other Services      Precautions / Restrictions Precautions Precautions: Fall Restrictions Weight Bearing Restrictions: No       Mobility Bed Mobility Overal bed mobility: Modified Independent                Transfers Overall transfer level: Needs assistance Equipment used: 1 person hand held assist Transfers: Sit to/from Stand;Stand Pivot Transfers Sit to Stand: Min guard Stand pivot transfers: Min guard       General transfer comment: pt. c/o dizziness after standing to urinate.  assisted to nearby chair to rest.  stated he felt better and was able to resume ambulation to recliner.   unsteadiness noted along with cont. L neglect of UE     Balance                                           ADL either performed or assessed with clinical judgement   ADL Overall  ADL's : Needs assistance/impaired                         Toilet Transfer: Min guard;Ambulation;Grab bars;Comfort height toilet;Cueing for safety;Cueing for sequencing   Toileting- Clothing Manipulation and Hygiene: Min guard;Sit to/from stand       Functional mobility during ADLs: Minimal assistance;Min guard;Cueing for safety;Cueing for sequencing General ADL Comments: unsteady during functional mobility with ADLS during standing for urination.  pt. with LOB to left while trying to manage clothing the get ready to urinate, and had approx. 3 LOB steps to the L with therapist utilizing gait belt to prevent pt. from full LOB/FALL.  notable L neglect during amb. with one hand held assist.  left arm dragging allong bed and foot of bed while walking.  alerted pt. and he said "i know, i know" but made no initiative to correct without therapist asst. providing direct instructions for safety of L UE     Vision       Perception     Praxis      Cognition Arousal/Alertness: Awake/alert Behavior During Therapy: WFL for tasks assessed/performed Overall Cognitive Status: No family/caregiver present to determine baseline cognitive functioning  Exercises     Shoulder Instructions       General Comments      Pertinent Vitals/ Pain       Pain Assessment: No/denies pain  Home Living Family/patient expects to be discharged to:: Private residence Living Arrangements: Other (Comment) Available Help at Discharge: Friend(s);Available 24 hours/day Type of Home: House Home Access: Level entry     Home Layout: One level               Home Equipment: Other (comment)(pt reported he had a cane that he lost)          Prior Functioning/Environment              Frequency  Min 2X/week        Progress Toward Goals  OT Goals(current goals can now be found in the care plan section)  Progress towards OT goals:  Progressing toward goals     Plan      Co-evaluation                 AM-PAC PT "6 Clicks" Daily Activity     Outcome Measure   Help from another person eating meals?: None Help from another person taking care of personal grooming?: A Little Help from another person toileting, which includes using toliet, bedpan, or urinal?: A Little Help from another person bathing (including washing, rinsing, drying)?: A Little Help from another person to put on and taking off regular upper body clothing?: A Little Help from another person to put on and taking off regular lower body clothing?: A Little 6 Click Score: 19    End of Session Equipment Utilized During Treatment: Gait belt  OT Visit Diagnosis: Unsteadiness on feet (R26.81);Muscle weakness (generalized) (M62.81);Other abnormalities of gait and mobility (R26.89)   Activity Tolerance Patient tolerated treatment well   Patient Left in chair;with chair alarm set;with call bell/phone within reach   Nurse Communication          Time: 1152-1200 OT Time Calculation (min): 8 min  Charges: OT General Charges $OT Visit: 1 Visit OT Treatments $Self Care/Home Management : 8-22 mins   Robet Leu, COTA/L 04/20/2018, 12:09 PM

## 2018-04-20 NOTE — Progress Notes (Signed)
PROGRESS NOTE    William Gonzalez  QMV:[id number] DOB: Dec 26, 1955 DOA: 04/18/2018 PCP: System, Provider Not In   Brief Narrative: Patient is a 57 male with past medical history of hypertension, hyperlipidemia, diabetes type 2, coronary artery disease status post stent, CVA who presents to the emergency department with complaints of dizziness and left arm weakness.  Patient has history of a stroke in the past.  CT brain on presentation did not show any acute intra-cranial abnormalities.  Neurology consulted.  Patient waiting for MRI/MRI of the brain.  Assessment & Plan:   Principal Problem:   Stroke [facility]) Active Problems:   Hypertension   Diabetes mellitus without complication (HCC)   Anemia  Possible acute CVA: History of stroke in the past.  CT head on presentation did not show any acute  Abnormalities. Patient is weak on his left side.   MRI of the brain showed findings consistent with acute RIGHT lenticulostriate territory infarct . Carotid Doppler prelim result showed  1% to 39% ICA stenosis bilaterally. Echocardiogram  showed  severe focal basal and mild concentric hypertrophy. Systolic function was normal. The estimated ejection fraction was in therange of 50% to 55%. Wall motion was normal; there were no regional wall motion abnormalities  Hemoglobin A1c of 7.1. Continue aspirin and Plavix. Follow-up with PT/OT/speech. Continue Lipitor.  Neurology following  Hypertension: On metoprolol, amlodipine and lisinopril at home.  BP better today.  Diabetes type 2: On metformin 1 g twice daily, glipizide 10 mg twice daily at home.  Continue sliding-scale insulin here.  Elevated hemoglobin A1c most likely due to noncompliance.  Will reinforce on compliance.  GERD: Continue PPI.    Smoker: Counseled for smoking cessation.  Also smokes cocaine.  Counseled against that.  Anemia: Currently H and H stable.  Leucocytosis: Most likely reactive.  We will continue to monitor.  Patient is  afebrile.  DVT prophylaxis: SCD Code Status: Full Family Communication: None present at the bedside Disposition Plan: Pending PT evaluation,Likely home with home health tomorrow   Consultants: Neurology  Procedures: None  Antimicrobials: None  Subjective: Patient seen and examined the bedside this morning.  Remains comfortable.  No new issues/events.  He could not undergo full MRI evaluation yesterday.  Does not want to go for MRI/MRA again.  Objective: Vitals:   04/20/18 0615 04/20/18 0652 04/20/18 0854 04/20/18 1229  BP: (!) 195/61 (!) 142/92 (!) 150/74 140/67  Pulse: 66 (!) 57 (!) 58 (!) 59  Resp:    20  Temp:   97.9 F (36.6 C) 98.1 F (36.7 C)  TempSrc:   Oral Oral  SpO2:   98% 96%  Weight:      Height:        Intake/Output Summary (Last 24 hours) at 04/20/2018 1439 Last data filed at 04/20/2018 1222 Gross per 24 hour  Intake 480 ml  Output 525 ml  Net -45 ml   Filed Weights   04/18/18 1930  Weight: 66.5 kg (146 lb 9.7 oz)    Examination:  General exam: Appears calm and comfortable ,Not in distress,average built HEENT:PERRL,Oral mucosa moist, Ear/Nose normal on gross exam Respiratory system: Bilateral equal air entry, normal vesicular breath sounds, no wheezes or crackles  Cardiovascular system: S1 & S2 heard, RRR. No JVD, murmurs, rubs, gallops or clicks. No pedal edema. Gastrointestinal system: Abdomen is nondistended, soft and nontender. No organomegaly or masses felt. Normal bowel sounds heard. Central nervous system: Alert and oriented.  Power 4/ 5 on left upper extremity, power  4/5 on left lower extremity.  Right side normal Extremities: No edema, no clubbing ,no cyanosis, distal peripheral pulses palpable. Skin: No rashes, lesions or ulcers,no icterus ,no pallor MSK: Normal muscle bulk,tone ,power Psychiatry: Judgement and insight appear normal. Mood & affect appropriate.     Data Reviewed: I have personally reviewed following labs and imaging  studies  CBC: Recent Labs  Lab 04/18/18 1340 04/19/18 0605 04/20/18 0903  WBC 10.7* 11.3* 15.5*  NEUTROABS 7.5  --  12.8*  HGB 12.5* 12.6* 12.3*  HCT 37.1* 37.3* 35.9*  MCV 99.2 101.4* 99.4  PLT 308 243 281   Basic Metabolic Panel: Recent Labs  Lab 04/18/18 1340 04/19/18 0605 04/20/18 0903  NA 138 138 136  K 4.0 3.9 3.9  CL 107 105 105  CO2 GLUCOSE 170* 167* 116*  BUN CREATININE 0.79 0.77 0.82  CALCIUM 9.3 9.1 9.0   GFR: Estimated Creatinine Clearance: 82.1 mL/min (by C-G formula based on SCr of 0.82 mg/dL). Liver Function Tests: Recent Labs  Lab 04/18/18 2153 04/19/18 0605  AST 13* 17  ALT 13* 13*  ALKPHOS 93 90  BILITOT 0.6 0.6  PROT 6.1* 5.8*  ALBUMIN 3.3* 3.2*   No results for input(s): LIPASE, AMYLASE in the last 168 hours. No results for input(s): AMMONIA in the last 168 hours. Coagulation Profile: No results for input(s): INR, PROTIME in the last 168 hours. Cardiac Enzymes: No results for input(s): CKTOTAL, CKMB, CKMBINDEX, TROPONINI in the last 168 hours. BNP (last 3 results) No results for input(s): PROBNP in the last 8760 hours. HbA1C: Recent Labs    04/19/18 0605  HGBA1C 7.1*   CBG: Recent Labs  Lab 04/19/18 0610 04/19/18 1320 04/19/18 1547 04/19/18 2123 04/20/18 0559  GLUCAP 154* 148* 206* 89 168*   Lipid Profile: Recent Labs    04/19/18 0605  CHOL 137  HDL 50  LDLCALC 71  TRIG 79  CHOLHDL 2.7   Thyroid Function Tests: No results for input(s): TSH, T4TOTAL, FREET4, T3FREE, THYROIDAB in the last 72 hours. Anemia Panel: No results for input(s): VITAMINB12, FOLATE, FERRITIN, TIBC, IRON, RETICCTPCT in the last 72 hours. Sepsis Labs: No results for input(s): PROCALCITON, LATICACIDVEN in the last 168 hours.  No results found for this or any previous visit (from the past 240 hour(s)).       Radiology Studies: Dg Chest 2 View  Result Date: 04/19/2018 CLINICAL DATA:  Dizziness EXAM: CHEST - 2 VIEW  COMPARISON:  07/03/2012 FINDINGS: Mild cardiomegaly with minimal aortic atherosclerosis. No aneurysm. No acute pulmonary consolidation, effusion or pneumothorax. Chronic mild interstitial prominence redemonstrated. No overt pulmonary edema. No acute osseous abnormality. IMPRESSION: Stable mild cardiomegaly with aortic atherosclerosis. No active pulmonary disease. Electronically Signed   By: Tollie Eth M.D.   On: 04/19/2018 17:46   Ct Head Wo Contrast  Result Date: 04/18/2018 CLINICAL DATA:  Subacute neuro deficits.  Dizziness. EXAM: CT HEAD WITHOUT CONTRAST TECHNIQUE: Contiguous axial images were obtained from the base of the skull through the vertex without intravenous contrast. COMPARISON:  MRI 04/22/2015 FINDINGS: Brain: Mild atrophy. Mild chronic white matter changes. Negative for acute infarct. Negative for hemorrhage mass or edema. Vascular: Negative for hyperdense vessel Skull: Cystic lesion left parietal bone unchanged from 2016 MRI. No new skull lesion. Sinuses/Orbits: Mucosal edema in the paranasal sinuses. Normal orbit. Other: Enlargement of the sella filled with CSF compatible with empty sella. IMPRESSION: Atrophy and chronic microvascular ischemia.  Empty sella. No acute abnormality.  Electronically Signed   By: Marlan Palau M.D.   On: 04/18/2018 15:01   [name]  Result Date: 04/19/2018 CLINICAL DATA:  Focal neuro deficit, stroke suspected. Dizziness, LEFT upper extremity weakness, and gait instability,. Past medical history includes alcohol abuse, diabetes, hypertension, tobacco abuse, and coronary artery disease. EXAM: MRI HEAD WITHOUT CONTRAST TECHNIQUE: Multiplanar, multiecho pulse sequences of the brain and surrounding structures were obtained without intravenous contrast. COMPARISON:  04/18/2018. FINDINGS: The patient was claustrophobic, and would not allow further scanning, despite best efforts of the technologist. Brain: Axial diffusion weighted imaging demonstrates an  acute lenticulostriate infarct, RIGHT-sided, involving posterior limb internal capsule, posterior lentiform nucleus, and periventricular white matter. I am unable to assess for hemorrhage, additional mass lesion, or extra-axial fluid. No visible hydrocephalus. Vascular: Not assessed. Skull and upper cervical spine: Not assessed. Sinuses/Orbits: Not assessed. Other: Not assessed. Compared with CT scan performed yesterday, the infarct is not visible. IMPRESSION: Only a single axial diffusion-weighted pulse sequence was obtained, following which the patient refused to allow further scanning. Findings consistent with acute RIGHT lenticulostriate territory infarct. See discussion above. Electronically Signed   By: Elsie Stain M.D.   On: 04/19/2018 13:03        Scheduled Meds: . amLODipine  10 mg Oral Daily  . aspirin  300 mg Rectal Daily   Or  . aspirin  325 mg Oral Daily  . atorvastatin  80 mg Oral q1800  . clopidogrel  75 mg Oral Daily  . glipiZIDE  10 mg Oral BID AC  . insulin aspart  0-5 Units Subcutaneous QHS  . insulin aspart  0-9 Units Subcutaneous TID WC  . latanoprost  1 drop Both Eyes QHS  . lisinopril  10 mg Oral Daily  . metFORMIN  1,000 mg Oral BID WC  . metoprolol tartrate  12.5 mg Oral BID  . pantoprazole  40 mg Oral Daily  . sertraline  100 mg Oral BID   Continuous Infusions:   LOS: 2 days    Time spent: 25 mins.More than 50% of that time was spent in counseling and/or coordination of care.      Burnadette Pop, MD [facility] Pager [phone]  If 7PM-7AM, please contact night-coverage [email or web address] Password [email or web address] 04/20/2018, 2:39 PM

## 2018-04-20 NOTE — Progress Notes (Signed)
PT Evaluation Note  Impression:  Evaluation completed earlier this AM. Pt with a decline in function from his previous state. He does not want to go to rehab prior to going home, therefore recommend HHPT. Based on his balance deficits he is at risk for falls at home and we discussed this risk and is aware. Will continue to follow while in acute care as he will benefit from ongoing skilled PT. He will need a cane as he states he lost his.     04/20/18 1054  PT Visit Information  Last PT Received On 04/20/18  Assistance Needed +1  History of Present Illness William Gonzalez  is a 62 y.o. male, w  Hypertension, hyperlipidemia, dm2, CAD s/p stent , CVA , who presents with acute stroke.   Precautions  Precautions Fall  Restrictions  Weight Bearing Restrictions No  Home Living  Family/patient expects to be discharged to: Private residence  Living Arrangements Other (Comment)  Available Help at Discharge Friend(s);Available 24 hours/day  Type of Home House  Home Access Level entry  Home Layout One level  Home Equipment Other (comment) (pt reported he had a cane that he lost)  Prior Function  Level of Independence Needs assistance  Gait / Transfers Assistance Needed  (Indepenent with basic mobilty. Reoprts h/o falls, uses cane)  Comments does not drive  Communication  Communication No difficulties  Pain Assessment  Pain Assessment No/denies pain  Cognition  Arousal/Alertness Awake/alert  Behavior During Therapy WFL for tasks assessed/performed  Overall Cognitive Status Within Functional Limits for tasks assessed  Upper Extremity Assessment  Upper Extremity Assessment Defer to OT evaluation  Lower Extremity Assessment  Lower Extremity Assessment LLE deficits/detail  LLE Deficits / Details decreased strength, especially with functional activities  LLE Coordination decreased gross motor  Cervical / Trunk Assessment  Cervical / Trunk Assessment Normal  Bed Mobility  Overal bed mobility  Modified Independent  Transfers  Overall transfer level Needs assistance  Equipment used None  Transfers Sit to/from Stand  Sit to Stand Min guard  General transfer comment cues for safest technique  Ambulation/Gait  Ambulation/Gait assistance Min assist  Ambulation Distance (Feet) 120 Feet (also ambulated 19feet with a cane )  Assistive device Rolling walker (2 wheeled);Straight cane  Gait Pattern/deviations Decreased step length - left;Decreased stance time - left  General Gait Details pt had 2 minor balance losses when ambulating with RW that required PT to correct. No balance losses when ambulating with cane  Gait velocity decreased   Gait velocity interpretation <1.8 ft/sec, indicate of risk for recurrent falls  Modified Rankin (Stroke Patients Only)  Pre-Morbid Rankin Score 3  Modified Rankin 4  Balance  Standing balance support No upper extremity supported  Standing balance-Leahy Scale Poor  General Comments  General comments (skin integrity, edema, etc.) no family or friends present for eval  PT - End of Session  Equipment Utilized During Treatment Gait belt  Activity Tolerance Patient tolerated treatment well  Patient left in bed;with bed alarm set;with call bell/phone within reach  Nurse Communication Mobility status  PT Assessment  PT Recommendation/Assessment Patient needs continued PT services  PT Visit Diagnosis Hemiplegia and hemiparesis  Hemiplegia - Right/Left Left  Hemiplegia - dominant/non-dominant Non-dominant  Hemiplegia - caused by Cerebral infarction  PT Problem List Decreased strength;Decreased balance;Decreased mobility;Decreased coordination;Decreased knowledge of use of DME;Decreased knowledge of precautions  Barriers to Discharge Decreased caregiver support  PT Plan  PT Frequency (ACUTE ONLY) Min 4X/week  PT Treatment/Interventions (ACUTE ONLY) DME  instruction;Gait training;Functional mobility training;Therapeutic activities;Therapeutic  exercise;Balance training;Neuromuscular re-education;Patient/family education  AM-PAC PT "6 Clicks" Daily Activity Outcome Measure  Difficulty turning over in bed (including adjusting bedclothes, sheets and blankets)? 3  Difficulty moving from lying on back to sitting on the side of the bed?  3  Difficulty sitting down on and standing up from a chair with arms (e.g., wheelchair, bedside commode, etc,.)? 3  Help needed moving to and from a bed to chair (including a wheelchair)? 3  Help needed walking in hospital room? 3  Help needed climbing 3-5 steps with a railing?  2  6 Click Score 17  Mobility G Code  CK  PT Recommendation  Recommendations for Other Services OT consult  Follow Up Recommendations Home health PT;Supervision for mobility/OOB (pt does not want SNF for rehab)  PT equipment Cane  Individuals Consulted  Consulted and Agree with Results and Recommendations Patient  Acute Rehab PT Goals  Patient Stated Goal to go home  PT Goal Formulation With patient  Time For Goal Achievement 04/27/18  Potential to Achieve Goals Good  PT Time Calculation  PT Start Time (ACUTE ONLY) 1027  PT Stop Time (ACUTE ONLY) 1047  PT Time Calculation (min) (ACUTE ONLY) 20 min  PT General Charges  $$ ACUTE PT VISIT 1 Visit  PT Evaluation  $PT Eval Moderate Complexity 1 Mod  Written Expression  Dominant Hand Right   Lavona Mound, PT  [phone] 04/20/2018

## 2018-04-21 DIAGNOSIS — I6300 Cerebral infarction due to thrombosis of unspecified precerebral artery: Secondary | ICD-10-CM

## 2018-04-21 LAB — CBC WITH DIFFERENTIAL/PLATELET
Basophils Absolute: 0 10*3/uL (ref 0.0–0.1)
Basophils Relative: 0 %
EOS ABS: 0.2 10*3/uL (ref 0.0–0.7)
Eosinophils Relative: 2 %
HCT: 35.8 % — ABNORMAL LOW (ref 39.0–52.0)
HEMOGLOBIN: 12.1 g/dL — AB (ref 13.0–17.0)
Lymphocytes Relative: 15 %
Lymphs Abs: 2 10*3/uL (ref 0.7–4.0)
MCH: 33.4 pg (ref 26.0–34.0)
MCHC: 33.8 g/dL (ref 30.0–36.0)
MCV: 98.9 fL (ref 78.0–100.0)
MONOS PCT: 6 %
Monocytes Absolute: 0.8 10*3/uL (ref 0.1–1.0)
NEUTROS ABS: 10.1 10*3/uL — AB (ref 1.7–7.7)
Neutrophils Relative %: 77 %
Platelets: 275 10*3/uL (ref 150–400)
RBC: 3.62 MIL/uL — AB (ref 4.22–5.81)
RDW: 12.8 % (ref 11.5–15.5)
WBC: 13 10*3/uL — ABNORMAL HIGH (ref 4.0–10.5)

## 2018-04-21 LAB — GLUCOSE, CAPILLARY
GLUCOSE-CAPILLARY: 83 mg/dL (ref 65–99)
GLUCOSE-CAPILLARY: 76 mg/dL (ref 65–99)

## 2018-04-21 MED ORDER — CLOPIDOGREL BISULFATE 75 MG PO TABS: 75.0000 mg | ORAL_TABLET | Freq: Every day | ORAL | 0 refills | Status: AC

## 2018-04-21 MED ORDER — LISINOPRIL 20 MG PO TABS: 20.0000 mg | ORAL_TABLET | Freq: Every day | ORAL | 0 refills | Status: AC

## 2018-04-21 MED ORDER — ATORVASTATIN CALCIUM 40 MG PO TABS: 40.0000 mg | ORAL_TABLET | Freq: Every day | ORAL | 0 refills | Status: AC

## 2018-04-21 MED ORDER — AMLODIPINE BESYLATE 10 MG PO TABS: 10.0000 mg | ORAL_TABLET | Freq: Every day | ORAL | 0 refills | Status: DC

## 2018-04-21 MED ORDER — ASPIRIN EC 81 MG PO TBEC: 81.0000 mg | DELAYED_RELEASE_TABLET | Freq: Every day | ORAL | 0 refills | Status: AC

## 2018-04-21 NOTE — Discharge Summary (Signed)
Physician Discharge Summary  William Gonzalez:[id number] DOB: 1956-09-08 DOA: 04/18/2018  PCP: System, Provider Not In  Admit date: 04/18/2018 Discharge date: 04/21/2018  Admitted From: Home Disposition:  Home  Discharge Condition:Stable CODE STATUS:FULL Diet recommendation: Heart Healthy  Brief/Interim Summary: Patient is a 43 male with past medical history of hypertension, hyperlipidemia, diabetes type 2, coronary artery disease status post stent, CVA who presents to the emergency department with complaints of dizziness and left side weakness.  Patient has history of a stroke in the past.  CT brain on presentation did not show any acute intra-cranial abnormalities.  Neurology consulted.    MRI done on admission showed right lenticulostriate territory infarct.  Patient is weak on his left side including both left upper and lower extremities. Patient seen by physical therapy and recommended home health PT. He will continue on dual antiplatelet therapy for 3 weeks and then continue on Plavix alone.  Discussed with neurology.  He needs follow-up with neurology in outpatient.  Following problems were addressed during his hospitalization:   Possible acute CVA: History of stroke in the past.  CT head on presentation did not show any acute  Abnormalities. Patient is weak on his left side.   MRI of the brain showed findings consistent with acute RIGHT lenticulostriate territory infarct . Carotid Doppler prelim result showed  1% to 39% ICA stenosis bilaterally. Echocardiogram showed severe focal basal and mild concentric hypertrophy. Systolic function was normal. The estimated ejection fraction was in therange of 50% to 55%. Wall motion was normal; there were no regional wall motion abnormalities Hemoglobin A1c of 7.1. Continue aspirin and Plavix for 3 weeks and continue Plavix alone. Continue Lipitor.  Neurology follow up.  Hypertension: On metoprolol, amlodipine and lisinopril at home.     Increased the dose of amlodipine and lisinopril.  Diabetes type 2: On metformin 1 g twice daily, glipizide 10 mg twice daily at home.  Continue sliding-scale insulin here.  Elevated hemoglobin A1c of 7.1  most likely due to noncompliance.  Will reinforce on compliance.  GERD: Continue PPI.    Smoker: Counseled for smoking cessation.  Also smokes cocaine. Counseled against that.  Anemia: Currently H and H stable.  Leucocytosis: Most likely reactive.   Patient is afebrile.    Discharge Diagnoses:  Principal Problem:   Stroke [facility]) Active Problems:   Hypertension   Diabetes mellitus without complication (HCC)   Anemia    Discharge Instructions  Discharge Instructions    Ambulatory referral to Neurology   Complete by:  As directed    An appointment is requested in approximately:3 weeks   Diet - low sodium heart healthy   Complete by:  As directed    Discharge instructions   Complete by:  As directed    1) Take prescribed medications as instructed. 2) Follow up with neurology in 3 weeks as an outpatient.  Name and number of the provider group has been attached. 3) Follow up with a PCP as an outpatient in a week.Do a CBC test during the follow-up. 4) Check hemoglobin A1c level in 3 months.  Monitor your blood sugars at home. 5) Please quit substance abuse.  Take your medications on time. 6)Take aspirin and Plavix for 3 weeks then continue Plavix only.   Increase activity slowly   Complete by:  As directed      Allergies as of 04/21/2018      Reactions   Iodine Anaphylaxis, Swelling   Swells all over  Medication List    STOP taking these medications   aspirin 325 MG tablet Replaced by:  aspirin EC 81 MG tablet     TAKE these medications   acetaminophen 500 MG tablet Commonly known as:  TYLENOL Take 1,000 mg by mouth as needed for mild pain.   amLODipine 10 MG tablet Commonly known as:  NORVASC Take 1 tablet (10 mg total) by mouth daily. Start taking  on:  04/22/2018 What changed:    medication strength  how much to take   aspirin EC 81 MG tablet Take 1 tablet (81 mg total) by mouth daily for 21 days. Replaces:  aspirin 325 MG tablet   atorvastatin 40 MG tablet Commonly known as:  LIPITOR Take 1 tablet (40 mg total) by mouth daily at 6 PM. What changed:    medication strength  how much to take   BLUE-EMU SUPER STRENGTH Crea Apply 1 application topically as needed (shoulder or back pain).   clopidogrel 75 MG tablet Commonly known as:  PLAVIX Take 1 tablet (75 mg total) by mouth daily. Start taking on:  04/22/2018   glipiZIDE 10 MG tablet Commonly known as:  GLUCOTROL Take 10 mg by mouth 2 (two) times daily before a meal.   hydrocortisone cream 1 % Apply 1 application topically as needed for itching (rash).   latanoprost 0.005 % ophthalmic solution Commonly known as:  XALATAN Place 1 drop into both eyes at bedtime.   lisinopril 20 MG tablet Commonly known as:  PRINIVIL,ZESTRIL Take 1 tablet (20 mg total) by mouth daily. What changed:    medication strength  how much to take   metFORMIN 1000 MG tablet Commonly known as:  GLUCOPHAGE Take 1 tablet (1,000 mg total) by mouth 2 (two) times daily with a meal.   metoprolol tartrate 25 MG tablet Commonly known as:  LOPRESSOR Take 12.5 mg by mouth 2 (two) times daily.   omeprazole 20 MG capsule Commonly known as:  PRILOSEC Take 20 mg by mouth daily.   sertraline 100 MG tablet Commonly known as:  ZOLOFT Take 1 tablet (100 mg total) by mouth 2 (two) times daily.            Durable Medical Equipment  (From admission, onward)        Start     Ordered   04/20/18 1046  For home use only DME Cane  Once     04/20/18 1045     Follow-up Information    [facility]. Schedule an appointment as soon as possible for a visit in 3 week(s).   Specialty:  Neurology Contact information: [address] [place]  [address] [phone]         Allergies  Allergen Reactions  . Iodine Anaphylaxis and Swelling    Swells all over    Consultations: neurology  Procedures/Studies: Dg Chest 2 View  Result Date: 04/19/2018 CLINICAL DATA:  Dizziness EXAM: CHEST - 2 VIEW COMPARISON:  07/03/2012 FINDINGS: Mild cardiomegaly with minimal aortic atherosclerosis. No aneurysm. No acute pulmonary consolidation, effusion or pneumothorax. Chronic mild interstitial prominence redemonstrated. No overt pulmonary edema. No acute osseous abnormality. IMPRESSION: Stable mild cardiomegaly with aortic atherosclerosis. No active pulmonary disease. Electronically Signed   By: Tollie Eth M.D.   On: 04/19/2018 17:46   Ct Head Wo Contrast  Result Date: 04/18/2018 CLINICAL DATA:  Subacute neuro deficits.  Dizziness. EXAM: CT HEAD WITHOUT CONTRAST TECHNIQUE: Contiguous axial images were obtained from the base of the skull  through the vertex without intravenous contrast. COMPARISON:  MRI 04/22/2015 FINDINGS: Brain: Mild atrophy. Mild chronic white matter changes. Negative for acute infarct. Negative for hemorrhage mass or edema. Vascular: Negative for hyperdense vessel Skull: Cystic lesion left parietal bone unchanged from 2016 MRI. No new skull lesion. Sinuses/Orbits: Mucosal edema in the paranasal sinuses. Normal orbit. Other: Enlargement of the sella filled with CSF compatible with empty sella. IMPRESSION: Atrophy and chronic microvascular ischemia.  Empty sella. No acute abnormality. Electronically Signed   By: Marlan Palau M.D.   On: 04/18/2018 15:01   [name]  Result Date: 04/19/2018 CLINICAL DATA:  Focal neuro deficit, stroke suspected. Dizziness, LEFT upper extremity weakness, and gait instability,. Past medical history includes alcohol abuse, diabetes, hypertension, tobacco abuse, and coronary artery disease. EXAM: MRI HEAD WITHOUT CONTRAST TECHNIQUE: Multiplanar, multiecho pulse sequences of the brain and  surrounding structures were obtained without intravenous contrast. COMPARISON:  04/18/2018. FINDINGS: The patient was claustrophobic, and would not allow further scanning, despite best efforts of the technologist. Brain: Axial diffusion weighted imaging demonstrates an acute lenticulostriate infarct, RIGHT-sided, involving posterior limb internal capsule, posterior lentiform nucleus, and periventricular white matter. I am unable to assess for hemorrhage, additional mass lesion, or extra-axial fluid. No visible hydrocephalus. Vascular: Not assessed. Skull and upper cervical spine: Not assessed. Sinuses/Orbits: Not assessed. Other: Not assessed. Compared with CT scan performed yesterday, the infarct is not visible. IMPRESSION: Only a single axial diffusion-weighted pulse sequence was obtained, following which the patient refused to allow further scanning. Findings consistent with acute RIGHT lenticulostriate territory infarct. See discussion above. Electronically Signed   By: Elsie Stain M.D.   On: 04/19/2018 13:03      Subjective: Patient seen and examined at bedside this morning.  Remains comfortable.  Able to go home.  Stable for discharge today.  Discharge Exam: Vitals:   04/21/18 0740 04/21/18 1200  BP: (!) 155/79 (!) 161/74  Pulse: 60 (!) 51  Resp: 18 18  Temp: 98.5 F (36.9 C) 97.8 F (36.6 C)  SpO2: 98% 99%   Vitals:   04/20/18 2343 04/21/18 0339 04/21/18 0740 04/21/18 1200  BP: 134/63 (!) 151/73 (!) 155/79 (!) 161/74  Pulse: (!) 58 (!) 50 60 (!) 51  Resp: (!) Temp: 98.2 F (36.8 C) 98.3 F (36.8 C) 98.5 F (36.9 C) 97.8 F (36.6 C)  TempSrc:  Oral Oral Oral  SpO2: 94% 98% 98% 99%  Weight:      Height:        General: Pt is alert, awake, not in acute distress Cardiovascular: RRR, S1/S2 +, no rubs, no gallops Respiratory: CTA bilaterally, no wheezing, no rhonchi Abdominal: Soft, NT, ND, bowel sounds + Extremities: no edema, no cyanosis, power 4 /5 on left  side including both upper and lower extremities    The results of significant diagnostics from this hospitalization (including imaging, microbiology, ancillary and laboratory) are listed below for reference.     Microbiology: No results found for this or any previous visit (from the past 240 hour(s)).   Labs: BNP (last 3 results) No results for input(s): BNP in the last 8760 hours. Basic Metabolic Panel: Recent Labs  Lab 04/18/18 1340 04/19/18 0605 04/20/18 0903  NA 138 138 136  K 4.0 3.9 3.9  CL 107 105 105  CO2 GLUCOSE 170* 167* 116*  BUN CREATININE 0.79 0.77 0.82  CALCIUM 9.3 9.1 9.0   Liver Function  Tests: Recent Labs  Lab 04/18/18 2153 04/19/18 0605  AST 13* 17  ALT 13* 13*  ALKPHOS 93 90  BILITOT 0.6 0.6  PROT 6.1* 5.8*  ALBUMIN 3.3* 3.2*   No results for input(s): LIPASE, AMYLASE in the last 168 hours. No results for input(s): AMMONIA in the last 168 hours. CBC: Recent Labs  Lab 04/18/18 1340 04/19/18 0605 04/20/18 0903 04/21/18 0252  WBC 10.7* 11.3* 15.5* 13.0*  NEUTROABS 7.5  --  12.8* 10.1*  HGB 12.5* 12.6* 12.3* 12.1*  HCT 37.1* 37.3* 35.9* 35.8*  MCV 99.2 101.4* 99.4 98.9  PLT 308 243 281 275   Cardiac Enzymes: No results for input(s): CKTOTAL, CKMB, CKMBINDEX, TROPONINI in the last 168 hours. BNP: Invalid input(s): POCBNP CBG: Recent Labs  Lab 04/20/18 0559 04/20/18 1607 04/20/18 2149 04/21/18 0637 04/21/18 1148  GLUCAP 168* 64* 81 83 76   D-Dimer No results for input(s): DDIMER in the last 72 hours. Hgb A1c Recent Labs    04/19/18 0605  HGBA1C 7.1*   Lipid Profile Recent Labs    04/19/18 0605  CHOL 137  HDL 50  LDLCALC 71  TRIG 79  CHOLHDL 2.7   Thyroid function studies No results for input(s): TSH, T4TOTAL, T3FREE, THYROIDAB in the last 72 hours.  Invalid input(s): FREET3 Anemia work up No results for input(s): VITAMINB12, FOLATE, FERRITIN, TIBC, IRON, RETICCTPCT in the last 72  hours. Urinalysis    Component Value Date/Time   COLORURINE YELLOW 04/18/2018 1913   APPEARANCEUR CLEAR 04/18/2018 1913   LABSPEC 1.015 04/18/2018 1913   PHURINE 6.0 04/18/2018 1913   GLUCOSEU >=500 (A) 04/18/2018 1913   HGBUR NEGATIVE 04/18/2018 1913   BILIRUBINUR NEGATIVE 04/18/2018 1913   KETONESUR NEGATIVE 04/18/2018 1913   PROTEINUR 100 (A) 04/18/2018 1913   UROBILINOGEN 1.0 04/22/2015 0050   NITRITE NEGATIVE 04/18/2018 1913   LEUKOCYTESUR NEGATIVE 04/18/2018 1913   Sepsis Labs Invalid input(s): PROCALCITONIN,  WBC,  LACTICIDVEN Microbiology No results found for this or any previous visit (from the past 240 hour(s)).   Time coordinating discharge: 35 minutes  SIGNED:   Burnadette Pop, MD  [facility] 04/21/2018, 12:37 PM Pager [phone]  If 7PM-7AM, please contact night-coverage [email or web address] Password [email or web address]

## 2018-04-21 NOTE — Progress Notes (Signed)
Physical Therapy Treatment Patient Details Name: William Gonzalez MRN: [id number] DOB: 11/21/1956 Today's Date: 04/21/2018    History of Present Illness Caylin Raby  is a 62 y.o. male, w  Hypertension, hyperlipidemia, dm2, CAD s/p stent , CVA , apparently presents with c/o dizziness then nite before after supper around 8pm and then apparently woke up this morning with left arm weakness.  Pt states that his symptoms felt like prior stroke symptoms and therefore presented to ED. Pt denies headache, vision change, slurred speech, cp, palp, n/v, diarrhea, brbpr, black stool    PT Comments    Pt making progress with mobility, but is not back to baseline. Recommend continued PT at discharge to continue to work on balance and mobility. Pt is aware of his increased risk for falls. Anticipate pt will DC home today per his report and if he remains in the hospital PT will continue to follow.      Follow Up Recommendations  Home health PT     Equipment Recommendations  Cane    Recommendations for Other Services       Precautions / Restrictions Precautions Precautions: Fall Restrictions Weight Bearing Restrictions: No    Mobility  Bed Mobility Overal bed mobility: Modified Independent             General bed mobility comments: increased time and effort required  Transfers Overall transfer level: Needs assistance Equipment used: None Transfers: Sit to/from Stand Sit to Stand: Min guard         General transfer comment: no c/o dizziness.   Ambulation/Gait Ambulation/Gait assistance: Min guard Ambulation Distance (Feet): 120 Feet Assistive device: Straight cane Gait Pattern/deviations: Decreased stride length;Scissoring(occassional scissoring noted. No balance loses noted)   Gait velocity interpretation: <1.8 ft/sec, indicate of risk for recurrent falls General Gait Details: gait improving, but still not back to baseline   Stairs             Wheelchair  Mobility    Modified Rankin (Stroke Patients Only) Modified Rankin (Stroke Patients Only) Pre-Morbid Rankin Score: Moderate disability Modified Rankin: Moderately severe disability     Balance                                            Cognition Arousal/Alertness: Awake/alert Behavior During Therapy: WFL for tasks assessed/performed Overall Cognitive Status: Within Functional Limits for tasks assessed                                        Exercises      General Comments General comments (skin integrity, edema, etc.): pt states he is going home today therefore worked on functional activities. Retireved his items from his closet and out on shorts, shirt, and shoes. Pt self reports this was much harder than it was before. gave tips on ways to make it easier.        Pertinent Vitals/Pain Pain Assessment: No/denies pain    Home Living                      Prior Function            PT Goals (current goals can now be found in the care plan section) Progress towards PT goals: Progressing toward goals    Frequency  Min 4X/week      PT Plan Current plan remains appropriate    Co-evaluation              AM-PAC PT "6 Clicks" Daily Activity  Outcome Measure  Difficulty turning over in bed (including adjusting bedclothes, sheets and blankets)?: A Little Difficulty moving from lying on back to sitting on the side of the bed? : A Little Difficulty sitting down on and standing up from a chair with arms (e.g., wheelchair, bedside commode, etc,.)?: A Little Help needed moving to and from a bed to chair (including a wheelchair)?: A Little Help needed walking in hospital room?: A Little Help needed climbing 3-5 steps with a railing? : A Lot 6 Click Score: 17    End of Session Equipment Utilized During Treatment: Gait belt Activity Tolerance: Patient tolerated treatment well Patient left: in chair;with chair alarm set;with  nursing/sitter in room   PT Visit Diagnosis: Hemiplegia and hemiparesis Hemiplegia - Right/Left: Left Hemiplegia - dominant/non-dominant: Non-dominant Hemiplegia - caused by: Cerebral infarction     Time: 1020-1044 PT Time Calculation (min) (ACUTE ONLY): 24 min  Charges:  $Gait Training: 23-37 mins                    G CodesLavona Mound, PT  [phone] 04/21/2018    Donnella Sham 04/21/2018, 10:53 AM

## 2018-04-21 NOTE — Progress Notes (Signed)
Patient keeping activating call bel and sending his ride to the front desk. Patient persistent about leaving w/o discharge instructions. MD paged, prescriptions given to patient and discharge instructions reviewed with patient, IV catheter removed intact. Patient assisted off unit and into awaiting transport.

## 2018-04-21 NOTE — Progress Notes (Signed)
Subjective: Dizziness, left sided weakness is improving. Transthoracic echo showed normal ejection fraction. LDL cholesterol is 71 mg percent. Hemoglobin A1c 7.5. Patient has been seen by therapy and has been cleared for discharge.  Objective: Current vital signs: BP (!) 161/74 (BP Location: Left Arm)   Pulse (!) 51   Temp 97.8 F (36.6 C) (Oral)   Resp 18   Ht  (1.676 m)   Wt 146 lb 9.7 oz (66.5 kg)   SpO2 99%   BMI 23.66 kg/m  Vital signs in last 24 hours: Temp:  [97.8 F (36.6 C)-98.5 F (36.9 C)] 97.8 F (36.6 C) (04/29 1200) Pulse Rate:  [50-62] 51 (04/29 1200) Resp:  [16-21] 18 (04/29 1200) BP: (112-161)/(47-79) 161/74 (04/29 1200) SpO2:  [94 %-99 %] 99 % (04/29 1200)  Intake/Output from previous day: 04/28 0701 - 04/29 0700 In: 240 [P.O.:240] Out: 950 [Urine:950] Intake/Output this shift: Total I/O In: 120 [P.O.:120] Out: 100 [Urine:100] Nutritional status: Fall precautions Diet heart healthy/carb modified Room service appropriate? Yes; Fluid consistency: Thin Diet - low sodium heart healthy   Neurological Exam  alert and oriented x3 Equal and reactive light accommodation, extraocular movements are intact, left facial droop upon smiling, sensation of the face intact strong shoulder shrug Motor exam left upper extremity 4/5,with weakness of left grip and intrinsic hand muscles. Orbits right over left upper extremity. 5/5 lower extremity 5/5 right side is within normal limits Finger-to-nose test on the right side is intact, left side shows mild dysmetria Speech very mild dysarthria       Lab Results: Basic Metabolic Panel: Recent Labs  Lab 04/18/18 1340 04/19/18 0605 04/20/18 0903  NA 138 138 136  K 4.0 3.9 3.9  CL 107 105 105  CO2 GLUCOSE 170* 167* 116*  BUN CREATININE 0.79 0.77 0.82  CALCIUM 9.3 9.1 9.0    Liver Function Tests: Recent Labs  Lab 04/18/18 2153 04/19/18 0605  AST 13* 17  ALT 13* 13*  ALKPHOS 93 90   BILITOT 0.6 0.6  PROT 6.1* 5.8*  ALBUMIN 3.3* 3.2*   No results for input(s): AMMONIA in the last 168 hours.  CBC: Recent Labs  Lab 04/18/18 1340 04/19/18 0605 04/20/18 0903 04/21/18 0252  WBC 10.7* 11.3* 15.5* 13.0*  NEUTROABS 7.5  --  12.8* 10.1*  HGB 12.5* 12.6* 12.3* 12.1*  HCT 37.1* 37.3* 35.9* 35.8*  MCV 99.2 101.4* 99.4 98.9  PLT 308 243 281 275      Lipid Panel: Recent Labs  Lab 04/19/18 0605  CHOL 137  TRIG 79  HDL 50  CHOLHDL 2.7  VLDL 16  LDLCALC 71    CBG: Recent Labs  Lab 04/20/18 0559 04/20/18 1607 04/20/18 2149 04/21/18 0637 04/21/18 1148  GLUCAP 168* 64* 81 83 76    Carotid ultrasound - 1% to 39% ICA stenosis bilaterally. Vertebral artery flow is antegrade.    Imaging: No results found.  Current Facility-Administered Medications (Endocrine & Metabolic):  .  glipiZIDE (GLUCOTROL) tablet 10 mg .  insulin aspart (novoLOG) injection 0-5 Units .  insulin aspart (novoLOG) injection 0-9 Units .  metFORMIN (GLUCOPHAGE) tablet 1,000 mg  Current Outpatient Medications (Endocrine & Metabolic):  .  glipiZIDE (GLUCOTROL) 10 MG tablet, Take 10 mg by mouth 2 (two) times daily before a meal. .  metFORMIN (GLUCOPHAGE) 1000 MG tablet, Take 1 tablet (1,000 mg total) by mouth 2 (two) times daily with a meal.  Current Facility-Administered Medications (Cardiovascular):  .  amLODipine (NORVASC) tablet 10 mg .  atorvastatin (LIPITOR) tablet 80 mg .  lisinopril (PRINIVIL,ZESTRIL) tablet 10 mg .  metoprolol tartrate (LOPRESSOR) tablet 12.5 mg  Current Outpatient Medications (Cardiovascular):  .  metoprolol tartrate (LOPRESSOR) 25 MG tablet, Take 12.5 mg by mouth 2 (two) times daily. Melene Muller ON 04/22/2018] amLODipine (NORVASC) 10 MG tablet, Take 1 tablet (10 mg total) by mouth daily. Marland Kitchen  atorvastatin (LIPITOR) 40 MG tablet, Take 1 tablet (40 mg total) by mouth daily at 6 PM. .  lisinopril (PRINIVIL,ZESTRIL) 20 MG tablet, Take 1 tablet (20 mg total) by  mouth daily.    Current Facility-Administered Medications (Analgesics):  .  acetaminophen (TYLENOL) tablet 650 mg **OR** acetaminophen (TYLENOL) solution 650 mg **OR** acetaminophen (TYLENOL) suppository 650 mg .  aspirin suppository 300 mg **OR** aspirin tablet 325 mg  Current Outpatient Medications (Analgesics):  .  acetaminophen (TYLENOL) 500 MG tablet, Take 1,000 mg by mouth as needed for mild pain. Marland Kitchen  aspirin EC 81 MG tablet, Take 1 tablet (81 mg total) by mouth daily for 21 days.  Current Facility-Administered Medications (Hematological):  .  clopidogrel (PLAVIX) tablet 75 mg  Current Outpatient Medications (Hematological):  Marland Kitchen  [START ON 04/22/2018] clopidogrel (PLAVIX) 75 MG tablet, Take 1 tablet (75 mg total) by mouth daily.  Current Facility-Administered Medications (Other):  .  latanoprost (XALATAN) 0.005 % ophthalmic solution 1 drop .  pantoprazole (PROTONIX) EC tablet 40 mg .  sertraline (ZOLOFT) tablet 100 mg  Current Outpatient Medications (Other):  .  hydrocortisone cream 1 %, Apply 1 application topically as needed for itching (rash). Marland Kitchen  latanoprost (XALATAN) 0.005 % ophthalmic solution, Place 1 drop into both eyes at bedtime. .  Liniments (BLUE-EMU SUPER STRENGTH) CREA, Apply 1 application topically as needed (shoulder or back pain). Marland Kitchen  omeprazole (PRILOSEC) 20 MG capsule, Take 20 mg by mouth daily. .  sertraline (ZOLOFT) 100 MG tablet, Take 1 tablet (100 mg total) by mouth 2 (two) times daily. (Patient not taking: Reported on 04/21/2015)  Assessment: 62 y.o. male presenting with acute onset of dizziness and left-sided weakness. Likely to be ischemic stroke Associated risk factors, DM, cocaine, HTN, CAD  MRI brain shows right periventricular lacunar stroke, MRA head showed mild signs of small vessel disease, echocardiogram showed normal ejection fraction.  Continue dual anitplatelet therapy Aspirin and plavix x 3 weeks followed byand Statin therapy for long term  stroke prevention. Recommend optimum control of DM and HTN     Tobacco and cocaine cessation.long discussion with the patient with regarding need for follow-up lifestyle changes and aggressive risk factor modificationand answered questions. Follow-up as anoutpatient in the stroke clinic in 6 weeks. Call earlier if necessary. Discussed with Dr. Renford Dills   Greater than 50% time during this 25 minute visit was spent on counseling and coordination of care about his lacunar stroke and discussion about risk factor modification and answered questions.    Delia Heady, MD Medical Director [facility] Pager: [phone] 04/21/2018 2:13 PM          04/21/2018, 2:08 PM

## 2018-04-21 NOTE — Care Management Note (Signed)
Case Management Note  Patient Details  Name: William Gonzalez MRN: [id number] Date of Birth: 08-11-1956  Subjective/Objective:      Pt admitted with stroke. He is from home with a friend. Pt has [facility] for [facility] and sees Dr Raphael Gibney at [facility].              Action/Plan: Per Lenn Sink, [facility] is in network with them for [facility] services. CM notified Lupita Leash with [facility] of the referral and provided her the MD and number that would sign HH orders.  CM faxed the orders and needed information to Dr Jonny Ruiz clinic per request.  Pt with orders for a cane. CM notified [facility] and they delivered to the room.  Pt had transportation home.   Expected Discharge Date:  04/21/18               Expected Discharge Plan:  Home w Home Health Services  In-House Referral:     Discharge planning Services  CM Consult  Post Acute Care Choice:  Durable Medical Equipment, Home Health Choice offered to:  Patient  DME Arranged:  [facility] Mor DME Agency:  [facility]  HH Arranged:  PT [facility] Agency:  [facility]  Status of Service:  Completed, signed off  If discussed at Long Length of Stay Meetings, dates discussed:    Additional Comments:  Kermit Balo, RN 04/21/2018, 1:04 PM

## 2018-04-25 ENCOUNTER — Encounter (HOSPITAL_COMMUNITY): Payer: Self-pay | Admitting: Emergency Medicine

## 2018-04-25 ENCOUNTER — Emergency Department (HOSPITAL_COMMUNITY)
Admission: EM | Admit: 2018-04-25 | Discharge: 2018-04-25 | Disposition: A | Discharge status: Home / Self Care | Payer: Non-veteran care | Attending: Physician Assistant | Admitting: Physician Assistant

## 2018-04-25 ENCOUNTER — Emergency Department (HOSPITAL_COMMUNITY): Payer: Non-veteran care

## 2018-04-25 DIAGNOSIS — Z8673 Personal history of transient ischemic attack (TIA), and cerebral infarction without residual deficits: Secondary | ICD-10-CM | POA: Diagnosis not present

## 2018-04-25 DIAGNOSIS — Z7902 Long term (current) use of antithrombotics/antiplatelets: Secondary | ICD-10-CM | POA: Diagnosis not present

## 2018-04-25 DIAGNOSIS — I10 Essential (primary) hypertension: Secondary | ICD-10-CM | POA: Diagnosis not present

## 2018-04-25 DIAGNOSIS — E119 Type 2 diabetes mellitus without complications: Secondary | ICD-10-CM | POA: Diagnosis not present

## 2018-04-25 DIAGNOSIS — M6281 Muscle weakness (generalized): Secondary | ICD-10-CM | POA: Insufficient documentation

## 2018-04-25 DIAGNOSIS — Z7984 Long term (current) use of oral hypoglycemic drugs: Secondary | ICD-10-CM | POA: Diagnosis not present

## 2018-04-25 DIAGNOSIS — F17210 Nicotine dependence, cigarettes, uncomplicated: Secondary | ICD-10-CM | POA: Diagnosis not present

## 2018-04-25 DIAGNOSIS — R42 Dizziness and giddiness: Secondary | ICD-10-CM | POA: Insufficient documentation

## 2018-04-25 DIAGNOSIS — Z7982 Long term (current) use of aspirin: Secondary | ICD-10-CM | POA: Insufficient documentation

## 2018-04-25 DIAGNOSIS — I2510 Atherosclerotic heart disease of native coronary artery without angina pectoris: Secondary | ICD-10-CM | POA: Insufficient documentation

## 2018-04-25 DIAGNOSIS — R531 Weakness: Secondary | ICD-10-CM

## 2018-04-25 LAB — COMPREHENSIVE METABOLIC PANEL
ALT: 15 U/L — ABNORMAL LOW (ref 17–63)
ANION GAP: 12 (ref 5–15)
AST: 16 U/L (ref 15–41)
Albumin: 3.9 g/dL (ref 3.5–5.0)
Alkaline Phosphatase: 106 U/L (ref 38–126)
BILIRUBIN TOTAL: 0.7 mg/dL (ref 0.3–1.2)
BUN: 15 mg/dL (ref 6–20)
CO2: 23 mmol/L (ref 22–32)
Calcium: 9.7 mg/dL (ref 8.9–10.3)
Chloride: 106 mmol/L (ref 101–111)
Creatinine, Ser: 1.07 mg/dL (ref 0.61–1.24)
GFR calc Af Amer: >60 mL/min (ref >60)
Glucose, Bld: 184 mg/dL — ABNORMAL HIGH (ref 65–99)
POTASSIUM: 4.2 mmol/L (ref 3.5–5.1)
Sodium: 141 mmol/L (ref 135–145)
Total Protein: 6.8 g/dL (ref 6.5–8.1)

## 2018-04-25 LAB — CBC
HEMATOCRIT: 39.5 % (ref 39.0–52.0)
HEMOGLOBIN: 13.2 g/dL (ref 13.0–17.0)
MCH: 33.7 pg (ref 26.0–34.0)
MCHC: 33.4 g/dL (ref 30.0–36.0)
MCV: 100.8 fL — ABNORMAL HIGH (ref 78.0–100.0)
Platelets: 331 10*3/uL (ref 150–400)
RBC: 3.92 MIL/uL — ABNORMAL LOW (ref 4.22–5.81)
RDW: 12.8 % (ref 11.5–15.5)
WBC: 11.5 10*3/uL — ABNORMAL HIGH (ref 4.0–10.5)

## 2018-04-25 LAB — URINALYSIS, ROUTINE W REFLEX MICROSCOPIC
Bacteria, UA: NONE SEEN
Bilirubin Urine: NEGATIVE
Glucose, UA: NEGATIVE mg/dL
Hgb urine dipstick: NEGATIVE
KETONES UR: NEGATIVE mg/dL
LEUKOCYTES UA: NEGATIVE
Nitrite: NEGATIVE
Protein, ur: 100 mg/dL — AB
Specific Gravity, Urine: 1.021 (ref 1.005–1.030)
pH: 5 (ref 5.0–8.0)

## 2018-04-25 LAB — DIFFERENTIAL
Basophils Absolute: 0 10*3/uL (ref 0.0–0.1)
Basophils Relative: 0 %
EOS ABS: 0.3 10*3/uL (ref 0.0–0.7)
EOS PCT: 2 %
LYMPHS ABS: 2 10*3/uL (ref 0.7–4.0)
Lymphocytes Relative: 18 %
MONO ABS: 0.6 10*3/uL (ref 0.1–1.0)
MONOS PCT: 5 %
Neutro Abs: 8.6 10*3/uL — ABNORMAL HIGH (ref 1.7–7.7)
Neutrophils Relative %: 75 %

## 2018-04-25 NOTE — Progress Notes (Signed)
ED CM consulted concerning patient who was discharged from [facility] on 4/29 post CVA. Patient reports that he is experiencing increased weakness. ED CM noted patient was discharged [facility]  HHPT. Patient states, he was not aware of HH services being ordered, he said he has not contacted by [facility].  Patient also reported to the Pharmacy Tech that he has not filled his discharged Plavix or Amlodipine yet because he has not been able to get to the [place] in [place] yet due to his weakness. CM reviewed the medication and made patient aware of cost at [facility], which patient states he can afford, and he plans to have son drop prescriptions off.  CM discussed contacting [facility] concerning arranging a visit over the weekend patient and family are agreeable. CM discussed with the EDP Dr. Corlis Leak  transitional care plan and she is agreeable and placed orders for [facility] services. CM faxed referral via [facility]  to [facility] [phone]. [facility] concerning start of care this weekend. Updated patient and family who verbalized understanding teach back done.  No further ED CM needs identified.

## 2018-04-25 NOTE — ED Triage Notes (Signed)
Pt states recent admit for stroke, symptoms originally started 25th at 8pm. Pt has symptoms of dizziness and left sided weakness. Pt was discharged 29th. States he still had dizziness and left sided weakness when he was discharged. States yesterday around 0900 he woke up and noticed his dizziness and left sided weakness were getting worse. Pt states it has been progressively worse. Left sided facial droop also noted.

## 2018-04-25 NOTE — Discharge Instructions (Addendum)
We will need to follow-up with neurology as outlined in your discharge summary a couple days ago.  In addition we want you to follow-up with your primary care physician.  We have contacted case management to support you at home with more home health.  Please return with any concerns.

## 2018-04-25 NOTE — ED Notes (Signed)
Pt awaiting walker to take home prior to d/c. Burna Mortimer, case manager attempting to obtain walker for pt at this time. Pt and family aware and verbalized understanding.

## 2018-04-25 NOTE — ED Provider Notes (Signed)
[facility] EMERGENCY DEPARTMENT Provider Note   CSN: [id number] Arrival date & time: 04/25/18  1108     History   Chief Complaint Chief Complaint  Patient presents with  . Weakness    HPI William Gonzalez is a 62 y.o. male.  HPI   Patient is a 62 year old male with past medical history significant for coronary artery disease, stroke, hypertension, hyperlipidemia and cocaine use.  Patient presents today with worsening left-sided weakness.  Patient was recently admitted to the hospital and discharged on 28 April.  MRI showed right lacunar stroke.  Patient reports that at the time of discharge she was able to get up and ambulate with help with the physical therapist but he feels like he is gotten weaker since discharge home.  He feels like every day he wakes up he is weaker than the day before.  Today he woke up and was unable to really get out of bed.  Past Medical History:  Diagnosis Date  . Alcohol abuse   . Coronary artery disease   . Diabetes mellitus   . Hypertension   . Stented coronary artery   . Tobacco abuse     Patient Active Problem List   Diagnosis Date Noted  . Anemia 04/18/2018  . Ataxic gait   . Cocaine abuse (HCC)   . Cerebellar infarct (HCC) 04/21/2015  . Tobacco abuse 04/21/2015  . Alcohol abuse 04/21/2015  . Stroke (HCC) 04/21/2015  . Diabetes mellitus without complication (HCC) 04/21/2015  . Coronary artery disease   . Hypertension     Past Surgical History:  Procedure Laterality Date  . CORONARY STENT PLACEMENT          Home Medications    Prior to Admission medications   Medication Sig Start Date End Date Taking? Authorizing Provider  acetaminophen (TYLENOL) 500 MG tablet Take 1,000 mg by mouth as needed for mild pain.    [provider]  amLODipine (NORVASC) 10 MG tablet Take 1 tablet (10 mg total) by mouth daily. 04/22/18   Burnadette Pop, MD  aspirin EC 81 MG tablet Take 1 tablet (81 mg total) by mouth  daily for 21 days. 04/21/18 05/12/18  Burnadette Pop, MD  atorvastatin (LIPITOR) 40 MG tablet Take 1 tablet (40 mg total) by mouth daily at 6 PM. 04/21/18   Burnadette Pop, MD  clopidogrel (PLAVIX) 75 MG tablet Take 1 tablet (75 mg total) by mouth daily. 04/22/18   Burnadette Pop, MD  glipiZIDE (GLUCOTROL) 10 MG tablet Take 10 mg by mouth 2 (two) times daily before a meal.    [provider]  hydrocortisone cream 1 % Apply 1 application topically as needed for itching (rash).    [provider]  latanoprost (XALATAN) 0.005 % ophthalmic solution Place 1 drop into both eyes at bedtime.    [provider]  Liniments (BLUE-EMU SUPER STRENGTH) CREA Apply 1 application topically as needed (shoulder or back pain).    [provider]  lisinopril (PRINIVIL,ZESTRIL) 20 MG tablet Take 1 tablet (20 mg total) by mouth daily. 04/21/18   Burnadette Pop, MD  metFORMIN (GLUCOPHAGE) 1000 MG tablet Take 1 tablet (1,000 mg total) by mouth 2 (two) times daily with a meal. 07/04/12   Molpus, John, MD  metoprolol tartrate (LOPRESSOR) 25 MG tablet Take 12.5 mg by mouth 2 (two) times daily.    [provider]  omeprazole (PRILOSEC) 20 MG capsule Take 20 mg by mouth daily.    [provider]  sertraline (ZOLOFT) 100 MG tablet Take 1 tablet (100 mg total) by mouth 2 (two) times daily. Patient not taking: Reported on 04/21/2015 07/04/12   Molpus, Jonny Ruiz, MD    Family History Family History  Problem Relation Age of Onset  . Hypertension Mother   . Diabetes Mother   . Hypertension Father   . Diabetes Father   . Hypertension Sister   . Hypertension Brother   . Diabetes Sister   . Diabetes Brother     Social History Social History   Tobacco Use  . Smoking status: Current Every Day Smoker    Types: Cigarettes  . Smokeless tobacco: Never Used  Substance Use Topics  . Alcohol use: Yes  . Drug use: Yes    Types: Cocaine     Allergies   Iodine   Review of  Systems Review of Systems  Constitutional: Negative for activity change.  Respiratory: Negative for shortness of breath.   Cardiovascular: Negative for chest pain.  Gastrointestinal: Negative for abdominal pain.  Neurological: Positive for weakness.     Physical Exam Updated Vital Signs BP 135/72 (BP Location: Right Arm)   Pulse 66   Temp 97.8 F (36.6 C)   Resp 14   SpO2 100%   Physical Exam  Constitutional: He is oriented to person, place, and time. He appears well-nourished.  HENT:  Head: Normocephalic.  Eyes: Conjunctivae and EOM are normal.  Cardiovascular: Normal rate.  murmor  Pulmonary/Chest: Effort normal and breath sounds normal. No respiratory distress.  Abdominal: Soft. He exhibits no distension.  Neurological: He is oriented to person, place, and time.  4/5 strength of left upper extremity. 4.5/5 strength of left lower extremity.  Mildly slurred speech.  Mild facial droop.  Skin: Skin is warm and dry. He is not diaphoretic.  Psychiatric: He has a normal mood and affect. His behavior is normal.     ED Treatments / Results  Labs (all labs ordered are listed, but only abnormal results are displayed) Labs Reviewed  CBC - Abnormal; Notable for the following components:      Result Value   WBC 11.5 (*)    RBC 3.92 (*)    MCV 100.8 (*)    All other components within normal limits  DIFFERENTIAL - Abnormal; Notable for the following components:   Neutro Abs 8.6 (*)    All other components within normal limits  COMPREHENSIVE METABOLIC PANEL - Abnormal; Notable for the following components:   Glucose, Bld 184 (*)    ALT 15 (*)    All other components within normal limits  URINALYSIS, ROUTINE W REFLEX MICROSCOPIC - Abnormal; Notable for the following components:   Protein, ur 100 (*)    All other components within normal limits  CBG MONITORING, ED    EKG EKG Interpretation  Date/Time:  Friday Apr 25 2018 11:16:29 EDT Ventricular Rate:  70 PR  Interval:  142 QRS Duration: 94 QT Interval:  398 QTC Calculation: 429 R Axis:   -48 Text Interpretation:  Normal sinus rhythm Left anterior fascicular block Septal infarct , age undetermined ST & T wave abnormality, consider inferolateral ischemia No significant change since last tracing Confirmed by Gwyneth Sprout ([id number]) on 04/25/2018 11:19:52 AM   Radiology Ct Head Wo Contrast  Result Date: 04/25/2018 CLINICAL DATA:  Focal neurological deficit greater than 6 hours, possible stroke, dizziness and left-sided weakness EXAM: CT HEAD WITHOUT CONTRAST TECHNIQUE: Contiguous axial images were obtained from the base of the skull through the vertex without intravenous  contrast. COMPARISON:  MR brain 04/19/2018 and CT brain of 04/18/2017 FINDINGS: Brain: The ventricular system remains diffusely prominent as are the cortical sulci, indicative of atrophy. The septum is midline in position. There is now a low attention focus within the right posterior limb internal capsule, posterior lenticular consistent with lacunar infarct. No hemorrhage. No new abnormality nucleus and periventricular white matter consistent with lacunar infarct. No hemorrhage is seen. Mild changes of small vessel ischemic disease are noted throughout the periventricular white matter. No mass effect is evident. Vascular: No vascular abnormality is noted on this unenhanced study. Skull: On bone window images, no calvarial abnormality is seen. Sinuses/Orbits: A probable retention cyst is again noted within the floor of the left maxillary sinus. Also there is mild mucosal thickening within the right maxillary sinus. No air-fluid level is seen. Other: None. IMPRESSION: 1. Low-attenuation within the right posterior limb internal capsule and right posterior lenticular nucleus consistent with subacute lacunar infarct. No hemorrhage. 2. Retention cyst in the floor of the left maxillary sinus. Electronically Signed   By: Dwyane Dee M.D.   On: 04/25/2018  11:47    Procedures Procedures (including critical care time)  Medications Ordered in ED Medications - No data to display   Initial Impression / Assessment and Plan / ED Course  I have reviewed the triage vital signs and the nursing notes.  Pertinent labs & imaging results that were available during my care of the patient were reviewed by me and considered in my medical decision making (see chart for details).      Patient is a 62 year old male with past medical history significant for coronary artery disease, stroke, hypertension, hyperlipidemia and cocaine use.  Patient presents today with worsening left-sided weakness.  Patient was recently admitted to the hospital and discharged on 28 April.  MRI showed right lacunar stroke.  Patient reports that at the time of discharge she was able to get up and ambulate with help with the physical therapist but he feels like he is gotten weaker since discharge home.  He feels like every day he wakes up he is weaker than the day before.  Today he woke up and was unable to really get out of bed.  3:43 PM States he was unable to tolerate the MRI, and it looks like he only got one sequence done.  Patient reports that he does not want to get another MRI.  In fact he refuses.  Is unclear to me what exactly to do.  Patient's exam appears very similar to the one at discharge by Dr. Pearlean Brownie.  Will touch base with neurology.  6:17 PM Discussed with neurology.  They think is very unlikely that there would be much to do as an inpatient with mild worsening of the same stroke.  Patient's exam appears an identicle to discharge.  He is here with brother.  Will discuss with case management to support patient's home health.  Final Clinical Impressions(s) / ED Diagnoses   Final diagnoses:  None    ED Discharge Orders    None       Abelino Derrick, MD 04/25/18 1818

## 2018-04-28 ENCOUNTER — Telehealth: Payer: Self-pay | Admitting: Surgery

## 2018-04-28 NOTE — Telephone Encounter (Signed)
ED CM attempted to contact patient via phone to follow up on [facility] services visit this weekend. No answer unable to LVM CM will make second attempt later this evening.

## 2018-05-05 ENCOUNTER — Encounter (HOSPITAL_COMMUNITY): Payer: Self-pay | Admitting: Emergency Medicine

## 2018-05-05 ENCOUNTER — Emergency Department (HOSPITAL_COMMUNITY): Payer: Non-veteran care

## 2018-05-05 ENCOUNTER — Emergency Department (HOSPITAL_COMMUNITY)
Admission: EM | Admit: 2018-05-05 | Discharge: 2018-05-06 | Disposition: A | Discharge status: Home / Self Care | Payer: Non-veteran care | Attending: Emergency Medicine | Admitting: Emergency Medicine

## 2018-05-05 DIAGNOSIS — I10 Essential (primary) hypertension: Secondary | ICD-10-CM | POA: Diagnosis not present

## 2018-05-05 DIAGNOSIS — I2510 Atherosclerotic heart disease of native coronary artery without angina pectoris: Secondary | ICD-10-CM | POA: Diagnosis not present

## 2018-05-05 DIAGNOSIS — F17210 Nicotine dependence, cigarettes, uncomplicated: Secondary | ICD-10-CM | POA: Insufficient documentation

## 2018-05-05 DIAGNOSIS — R531 Weakness: Secondary | ICD-10-CM | POA: Diagnosis not present

## 2018-05-05 DIAGNOSIS — Z7902 Long term (current) use of antithrombotics/antiplatelets: Secondary | ICD-10-CM | POA: Insufficient documentation

## 2018-05-05 DIAGNOSIS — Z7982 Long term (current) use of aspirin: Secondary | ICD-10-CM | POA: Diagnosis not present

## 2018-05-05 DIAGNOSIS — Z7984 Long term (current) use of oral hypoglycemic drugs: Secondary | ICD-10-CM | POA: Insufficient documentation

## 2018-05-05 DIAGNOSIS — Z79899 Other long term (current) drug therapy: Secondary | ICD-10-CM | POA: Diagnosis not present

## 2018-05-05 DIAGNOSIS — K5900 Constipation, unspecified: Secondary | ICD-10-CM

## 2018-05-05 DIAGNOSIS — E119 Type 2 diabetes mellitus without complications: Secondary | ICD-10-CM | POA: Insufficient documentation

## 2018-05-05 DIAGNOSIS — Z8673 Personal history of transient ischemic attack (TIA), and cerebral infarction without residual deficits: Secondary | ICD-10-CM | POA: Insufficient documentation

## 2018-05-05 LAB — CBC WITH DIFFERENTIAL/PLATELET
BASOS ABS: 0 10*3/uL (ref 0.0–0.1)
BASOS PCT: 0 %
EOS PCT: 2 %
Eosinophils Absolute: 0.2 10*3/uL (ref 0.0–0.7)
HCT: 37.4 % — ABNORMAL LOW (ref 39.0–52.0)
Hemoglobin: 12.9 g/dL — ABNORMAL LOW (ref 13.0–17.0)
LYMPHS PCT: 21 %
Lymphs Abs: 2.9 10*3/uL (ref 0.7–4.0)
MCH: 33.9 pg (ref 26.0–34.0)
MCHC: 34.5 g/dL (ref 30.0–36.0)
MCV: 98.4 fL (ref 78.0–100.0)
MONO ABS: 0.6 10*3/uL (ref 0.1–1.0)
Monocytes Relative: 4 %
NEUTROS ABS: 10 10*3/uL — AB (ref 1.7–7.7)
Neutrophils Relative %: 73 %
PLATELETS: 417 10*3/uL — AB (ref 150–400)
RBC: 3.8 MIL/uL — ABNORMAL LOW (ref 4.22–5.81)
RDW: 12.2 % (ref 11.5–15.5)
WBC: 13.8 10*3/uL — ABNORMAL HIGH (ref 4.0–10.5)

## 2018-05-05 NOTE — ED Triage Notes (Signed)
Patient here for constipation.  He states that he is having pain all over.  He has not had any BMs since having a stroke last week.  Patient states that he has a lot wrong with him, and can not get it under control.  His son dropped him off here at the ED.

## 2018-05-06 LAB — COMPREHENSIVE METABOLIC PANEL
ALK PHOS: 119 U/L (ref 38–126)
ALT: 13 U/L — ABNORMAL LOW (ref 17–63)
AST: 16 U/L (ref 15–41)
Albumin: 3.8 g/dL (ref 3.5–5.0)
Anion gap: 10 (ref 5–15)
BILIRUBIN TOTAL: 0.3 mg/dL (ref 0.3–1.2)
BUN: 16 mg/dL (ref 6–20)
CO2: 23 mmol/L (ref 22–32)
Calcium: 9.6 mg/dL (ref 8.9–10.3)
Chloride: 104 mmol/L (ref 101–111)
Creatinine, Ser: 1.02 mg/dL (ref 0.61–1.24)
GFR calc Af Amer: >60 mL/min (ref >60)
GFR calc non Af Amer: >60 mL/min (ref >60)
Glucose, Bld: 215 mg/dL — ABNORMAL HIGH (ref 65–99)
POTASSIUM: 4.1 mmol/L (ref 3.5–5.1)
Sodium: 137 mmol/L (ref 135–145)
TOTAL PROTEIN: 6.8 g/dL (ref 6.5–8.1)

## 2018-05-06 LAB — URINALYSIS, ROUTINE W REFLEX MICROSCOPIC
BILIRUBIN URINE: NEGATIVE
Bacteria, UA: NONE SEEN
Glucose, UA: 150 mg/dL — AB
HGB URINE DIPSTICK: NEGATIVE
Ketones, ur: NEGATIVE mg/dL
LEUKOCYTES UA: NEGATIVE
Nitrite: NEGATIVE
PROTEIN: 100 mg/dL — AB
Specific Gravity, Urine: 1.025 (ref 1.005–1.030)
pH: 5 (ref 5.0–8.0)

## 2018-05-06 LAB — LIPASE, BLOOD: Lipase: 28 U/L (ref 11–51)

## 2018-05-06 LAB — CBG MONITORING, ED: GLUCOSE-CAPILLARY: 156 mg/dL — AB (ref 65–99)

## 2018-05-06 MED ORDER — SORBITOL 70 % SOLN
960.0000 mL | TOPICAL_OIL | Freq: Once | ORAL | Status: AC
  Administered 2018-05-06: 960 mL via RECTAL
  Filled 2018-05-06: qty 473

## 2018-05-06 MED ORDER — POLYETHYLENE GLYCOL 3350 17 G PO PACK
17.0000 g | PACK | Freq: Once | ORAL | Status: AC
  Administered 2018-05-06: 17 g via ORAL
  Filled 2018-05-06: qty 1

## 2018-05-06 MED ORDER — SENNOSIDES-DOCUSATE SODIUM 8.6-50 MG PO TABS: 2.0000 | ORAL_TABLET | Freq: Every day | ORAL | 0 refills | Status: AC

## 2018-05-06 MED ORDER — SODIUM CHLORIDE 0.9 % IV BOLUS
1000.0000 mL | Freq: Once | INTRAVENOUS | Status: AC
  Administered 2018-05-06: 1000 mL via INTRAVENOUS

## 2018-05-06 NOTE — Progress Notes (Signed)
CSW provided pt and pt's family members with [facility] Social Worker contact information to follow up about placement with the [facility].   Pt refused [facility]. RN and CSW asked multiple times about pt leaving by [facility]. Pt refused, stating he does not want to wait.   Montine Circle, [facility]  [phone]

## 2018-05-06 NOTE — ED Notes (Signed)
Attempting to call [facility] for transport, however, pt has no place to go.  Pt reports the people who he is staying with aren't home and he cannot get it.  Pt to inform this RN when he receives new information.

## 2018-05-06 NOTE — Discharge Planning (Signed)
Pt currently active with [facility] for PT services.  Resumption of care requested with additional services added (RN, NA, SW). Kizzie Furnish of [facility] notified.  No DME needs identified at this time.

## 2018-05-06 NOTE — Progress Notes (Signed)
CSW has followed up with pt's [facility] social worker Maggie Souther [phone] ext [phone]. CSW has left VM for Seward Grater asking that she call CSW back regarding pt and getting more services for pt. At this time CSW is awaiting call back from [place].   Claude Manges Daylan Boggess, MSW, LCSW-A Emergency Department Clinical Social Worker [phone]

## 2018-05-06 NOTE — Progress Notes (Signed)
CSW received call from [place] social worker Maggie. CSW sought further information from [place] on services that the [place] could possible provide to pt as MD feels that pt needs more services in the home. Maggie expressed that pt already has services through [facility]. Maggie suggested to this CSW that she received call from PT that has been working with pt in the home and was informed that pt is needing a little more rehab. Maggie expressed that she has been working on placement pt but has had no luck due to some of pt's history. Maggie suggested that she thought CSW in ED would be able to assist with this and CSW explained to Seward Grater that CSW understands that pt is set up with [facility] and that they have certain facilities that they are contracted to work with and take pt's. Maggie expressed that this is true and just asked that [facility] set up additional services for pt. At this time CSW has spoken with [facility] and she is working with [facility] services to get that set up. MD aware.  William Gonzalez, MSW, LCSW-A Emergency Department Clinical Social Worker [phone]

## 2018-05-06 NOTE — ED Notes (Signed)
Pt refused [facility], and is discharged to family.  Pt refused to sign DCI's.  There are no further needs

## 2018-05-06 NOTE — Discharge Instructions (Signed)
As discussed[facility] options for you, and in the interim, additional home health services are going to contact you. You should expect a call tomorrow, and if you do not hear from them, please be sure to speak with [facility].  Please take all newly prescribed medication as directed, and return here for concerning changes in your condition.

## 2018-05-06 NOTE — ED Provider Notes (Signed)
[facility] EMERGENCY DEPARTMENT Provider Note   CSN: [id number] Arrival date & time: 05/05/18  2016     History   Chief Complaint Chief Complaint  Patient presents with  . Constipation    HPI William Gonzalez is a 62 y.o. male.  HPI Patient presents with concern of abdominal discomfort and constipation. Patient was hospitalized about 2 weeks ago, notes that he has had no bowel movements in that time. He has had passage of flatus, and has had no vomiting, no fever. He notes that in spite of taking oral laxative, but not suppository he has had no change in his condition, with worsening discomfort. Patient denies other notable changes including new neurologic complications, notable as he has recent stroke. He states that he takes all other medication as directed, but over the past day has been unable to take them due to his discomfort, and arriving here for evaluation.  Past Medical History:  Diagnosis Date  . Alcohol abuse   . Coronary artery disease   . Diabetes mellitus   . Hypertension   . Stented coronary artery   . Tobacco abuse     Patient Active Problem List   Diagnosis Date Noted  . Anemia 04/18/2018  . Ataxic gait   . Cocaine abuse (HCC)   . Cerebellar infarct (HCC) 04/21/2015  . Tobacco abuse 04/21/2015  . Alcohol abuse 04/21/2015  . Stroke (HCC) 04/21/2015  . Diabetes mellitus without complication (HCC) 04/21/2015  . Coronary artery disease   . Hypertension     Past Surgical History:  Procedure Laterality Date  . CORONARY STENT PLACEMENT          Home Medications    Prior to Admission medications   Medication Sig Start Date End Date Taking? Authorizing Provider  acetaminophen (TYLENOL) 500 MG tablet Take 1,000 mg by mouth daily as needed for mild pain.     [provider]  amLODipine (NORVASC) 10 MG tablet Take 1 tablet (10 mg total) by mouth daily. 04/22/18   Burnadette Pop, MD  aspirin EC 325 MG tablet Take 325 mg  by mouth daily.    [provider]  aspirin EC 81 MG tablet Take 1 tablet (81 mg total) by mouth daily for 21 days. Patient not taking: Reported on 04/25/2018 04/21/18 05/12/18  Burnadette Pop, MD  atorvastatin (LIPITOR) 40 MG tablet Take 1 tablet (40 mg total) by mouth daily at 6 PM. Patient not taking: Reported on 04/25/2018 04/21/18   Burnadette Pop, MD  atorvastatin (LIPITOR) 80 MG tablet Take 40 mg by mouth daily.    [provider]  clopidogrel (PLAVIX) 75 MG tablet Take 1 tablet (75 mg total) by mouth daily. 04/22/18   Burnadette Pop, MD  glipiZIDE (GLUCOTROL) 10 MG tablet Take 10 mg by mouth 2 (two) times daily before a meal.    [provider]  hydrocortisone cream 1 % Apply 1 application topically daily as needed for itching (rash).     [provider]  latanoprost (XALATAN) 0.005 % ophthalmic solution Place 1 drop into both eyes at bedtime.    [provider]  Liniments (BLUE-EMU SUPER STRENGTH) CREA Apply 1 application topically daily as needed (shoulder or back pain).     [provider]  lisinopril (PRINIVIL,ZESTRIL) 20 MG tablet Take 1 tablet (20 mg total) by mouth daily. 04/21/18   Burnadette Pop, MD  metFORMIN (GLUCOPHAGE) 1000 MG tablet Take 1 tablet (1,000 mg total) by mouth 2 (two) times daily  with a meal. 07/04/12   Molpus, John, MD  metoprolol tartrate (LOPRESSOR) 25 MG tablet Take 12.5 mg by mouth 2 (two) times daily.    [provider]  omeprazole (PRILOSEC) 20 MG capsule Take 20 mg by mouth daily.    [provider]  sertraline (ZOLOFT) 100 MG tablet Take 1 tablet (100 mg total) by mouth 2 (two) times daily. Patient not taking: Reported on 04/21/2015 07/04/12   Molpus, Jonny Ruiz, MD    Family History Family History  Problem Relation Age of Onset  . Hypertension Mother   . Diabetes Mother   . Hypertension Father   . Diabetes Father   . Hypertension Sister   . Hypertension Brother   . Diabetes Sister   .  Diabetes Brother     Social History Social History   Tobacco Use  . Smoking status: Current Every Day Smoker    Types: Cigarettes  . Smokeless tobacco: Never Used  Substance Use Topics  . Alcohol use: Yes  . Drug use: Yes    Types: Cocaine     Allergies   Iodine   Review of Systems Review of Systems  Constitutional:       Per HPI, otherwise negative  HENT:       Per HPI, otherwise negative  Respiratory:       Per HPI, otherwise negative  Cardiovascular:       Per HPI, otherwise negative  Gastrointestinal: Positive for constipation. Negative for vomiting.  Endocrine:       Negative aside from HPI  Genitourinary:       Neg aside from HPI   Musculoskeletal:       Per HPI, otherwise negative  Skin: Negative.   Neurological: Positive for weakness. Negative for syncope.     Physical Exam Updated Vital Signs BP (!) 161/77 (BP Location: Right Arm)   Pulse 76   Temp 98.5 F (36.9 C) (Oral)   Resp 20   SpO2 97%   Physical Exam  Constitutional: He is oriented to person, place, and time. He appears well-developed.  Thin, uncomfortable appearing male awake, alert.  HENT:  Head: Normocephalic and atraumatic.  Eyes: Conjunctivae and EOM are normal.  Cardiovascular: Normal rate and regular rhythm.  Pulmonary/Chest: Effort normal. No stridor. No respiratory distress.  Abdominal: He exhibits no distension and no mass. There is tenderness. There is no guarding.  Non-peritoneal abdomen, though with some diffuse mild tenderness  Musculoskeletal: He exhibits no edema.  Neurological: He is alert and oriented to person, place, and time.  Skin: Skin is warm and dry.  Psychiatric: He has a normal mood and affect.  Nursing note and vitals reviewed.    ED Treatments / Results  Labs (all labs ordered are listed, but only abnormal results are displayed) Labs Reviewed  COMPREHENSIVE METABOLIC PANEL - Abnormal; Notable for the following components:      Result Value    Glucose, Bld 215 (*)    ALT 13 (*)    All other components within normal limits  CBC WITH DIFFERENTIAL/PLATELET - Abnormal; Notable for the following components:   WBC 13.8 (*)    RBC 3.80 (*)    Hemoglobin 12.9 (*)    HCT 37.4 (*)    Platelets 417 (*)    Neutro Abs 10.0 (*)    All other components within normal limits  URINALYSIS, ROUTINE W REFLEX MICROSCOPIC - Abnormal; Notable for the following components:   Color, Urine AMBER (*)    APPearance HAZY (*)  Glucose, UA 150 (*)    Protein, ur 100 (*)    All other components within normal limits  CBG MONITORING, ED - Abnormal; Notable for the following components:   Glucose-Capillary 156 (*)    All other components within normal limits  LIPASE, BLOOD     Radiology Dg Abd 1 View  Result Date: 05/05/2018 CLINICAL DATA:  Constipation for 2 weeks EXAM: ABDOMEN - 1 VIEW COMPARISON:  CT 04/30/2005 FINDINGS: Nonobstructed bowel-gas pattern with large volume of stool throughout the colon. No abnormal calcifications. Vascular calcifications. IMPRESSION: Nonobstructed gas pattern with large volume of stool throughout the colon Electronically Signed   By: Jasmine Pang M.D.   On: 05/05/2018 23:35    Procedures Procedures (including critical care time)  Medications Ordered in ED Medications  sodium chloride 0.9 % bolus 1,000 mL (0 mLs Intravenous Stopped 05/06/18 1011)  polyethylene glycol (MIRALAX / GLYCOLAX) packet 17 g (17 g Oral Given 05/06/18 1108)  sorbitol, milk of mag, mineral oil, glycerin (SMOG) enema (960 mLs Rectal Given 05/06/18 1436)     Initial Impression / Assessment and Plan / ED Course  I have reviewed the triage vital signs and the nursing notes.  Pertinent labs & imaging results that were available during my care of the patient were reviewed by me and considered in my medical decision making (see chart for details).  Update: Patient in similar condition, no bowel movements after oral laxative.   I have discussed  the patient's presentation with our social worker, and [facility] With both social colleagues, we discussed options for placement Patient has no indication for hospitalization, but given his diminished capacity to function at home, placement is being sought. In the interim, efforts are made to assist with home health care including nursing, social work, PT, OT.   3:41 PM Patient has had a production of substantial amounts of stool. Patient is now accompanied by 2 brothers, and we discussed his current living situation.    Final Clinical Impressions(s) / ED Diagnoses  Pain Constipation   Gerhard Munch, MD 05/06/18 1601

## 2018-09-09 ENCOUNTER — Ambulatory Visit: Payer: Non-veteran care | Attending: Family Medicine | Admitting: Family Medicine

## 2018-09-09 ENCOUNTER — Encounter: Payer: Self-pay | Admitting: Family Medicine

## 2018-09-09 ENCOUNTER — Other Ambulatory Visit: Payer: Self-pay

## 2018-09-09 VITALS — BP 152/82 | HR 72 | Temp 98.0°F | Resp 18 | Ht 66.0 in | Wt 160.4 lb

## 2018-09-09 DIAGNOSIS — E119 Type 2 diabetes mellitus without complications: Secondary | ICD-10-CM | POA: Diagnosis not present

## 2018-09-09 DIAGNOSIS — M2550 Pain in unspecified joint: Secondary | ICD-10-CM

## 2018-09-09 DIAGNOSIS — Z8249 Family history of ischemic heart disease and other diseases of the circulatory system: Secondary | ICD-10-CM | POA: Insufficient documentation

## 2018-09-09 DIAGNOSIS — Z9889 Other specified postprocedural states: Secondary | ICD-10-CM | POA: Insufficient documentation

## 2018-09-09 DIAGNOSIS — Z833 Family history of diabetes mellitus: Secondary | ICD-10-CM | POA: Insufficient documentation

## 2018-09-09 DIAGNOSIS — I10 Essential (primary) hypertension: Secondary | ICD-10-CM | POA: Insufficient documentation

## 2018-09-09 DIAGNOSIS — Z7902 Long term (current) use of antithrombotics/antiplatelets: Secondary | ICD-10-CM | POA: Insufficient documentation

## 2018-09-09 DIAGNOSIS — Z8673 Personal history of transient ischemic attack (TIA), and cerebral infarction without residual deficits: Secondary | ICD-10-CM | POA: Insufficient documentation

## 2018-09-09 DIAGNOSIS — F17210 Nicotine dependence, cigarettes, uncomplicated: Secondary | ICD-10-CM | POA: Insufficient documentation

## 2018-09-09 DIAGNOSIS — Z888 Allergy status to other drugs, medicaments and biological substances status: Secondary | ICD-10-CM | POA: Insufficient documentation

## 2018-09-09 DIAGNOSIS — I2510 Atherosclerotic heart disease of native coronary artery without angina pectoris: Secondary | ICD-10-CM | POA: Insufficient documentation

## 2018-09-09 DIAGNOSIS — Z955 Presence of coronary angioplasty implant and graft: Secondary | ICD-10-CM | POA: Insufficient documentation

## 2018-09-09 LAB — GLUCOSE, POCT (MANUAL RESULT ENTRY): POC Glucose: 143 mg/dL — AB (ref 70–99)

## 2018-09-09 LAB — POCT GLYCOSYLATED HEMOGLOBIN (HGB A1C)
HbA1c POC (<> result, manual entry): 7.1 %
HbA1c, POC (controlled diabetic range): 7.1 % — AB (ref 0.0–7.0)
HbA1c, POC (prediabetic range): 7.1 % — AB (ref 5.7–6.4)
Hemoglobin A1C: 7.1 % — AB (ref 4.0–5.6)

## 2018-09-09 MED ORDER — TRAMADOL HCL 50 MG PO TABS: 50.0000 mg | ORAL_TABLET | Freq: Three times a day (TID) | ORAL | 0 refills | Status: AC | PRN

## 2018-09-09 MED FILL — traMADol HCL 50 MG TABS: 50 | 5 days supply | Qty: 15 | Fill #0

## 2018-09-09 NOTE — Progress Notes (Signed)
Subjective:    Patient ID: William Gonzalez, male    DOB: 1956-07-06, 62 y.o.   MRN: [id number]  HPI 62 yo male who is new to the practice who presents secondary to the complaint of pain in multiple joints but especially in his hands. Patient reports that he receives care at the [facility] in [place] but does not have an appointment there until November. Patient is a former [employer] development officer.  Patient reports that [facility] but he does not know what kind of arthritis. Patient does think that he has swelling in the joints of his hands/fingers and wrists. Patient reports a medical history of heart disease s/p stenting, high blood pressure, diabetes and patient states that he recently had 2 strokes. Patient states that he was hospitalized here in [facility] for the strokes but was sent home without rehab so he went to the ED at the [facility] hospital in [place] and was placed at a rehab facility in [place]. Patient states that he wanted to stay there since he lives alone and does not have anyone to help him but they sent him home. Patient states that he is receiving home health and home nursing services. Patient has difficulty with walking since his strokes as he feels dizzy and off-balance and he currently uses a walker. Patient checks his blood sugars and so far that are 120's-140's fasting and he takes a medication by mouth to control his blood sugars.  Past Medical History:  Diagnosis Date  . Alcohol abuse   . Coronary artery disease   . Diabetes mellitus   . Hypertension   . Stented coronary artery   . Tobacco abuse    Past Surgical History:  Procedure Laterality Date  . CORONARY STENT PLACEMENT     Family History  Problem Relation Age of Onset  . Hypertension Mother   . Diabetes Mother   . Hypertension Father   . Diabetes Father   . Hypertension Sister   . Hypertension Brother   . Diabetes Sister   . Diabetes Brother    Social History   Tobacco Use  . Smoking  status: Current Every Day Smoker    Packs/day: 0.30    Types: Cigarettes  . Smokeless tobacco: Never Used  Substance Use Topics  . Alcohol use: Yes    Alcohol/week: 1.0 standard drinks    Types: 1 Cans of beer per week    Comment: occassionally   . Drug use: Yes    Types: Cocaine   Allergies  Allergen Reactions  . Iodine Anaphylaxis and Swelling    Swells all over      Review of Systems  Constitutional: Positive for chills and fatigue. Negative for fever.  Respiratory: Negative for cough and shortness of breath.   Cardiovascular: Negative for chest pain, palpitations and leg swelling.  Gastrointestinal: Negative for abdominal pain and nausea.  Genitourinary: Positive for dysuria and frequency.  Musculoskeletal: Positive for arthralgias, joint swelling and myalgias.  Neurological: Positive for dizziness, weakness and light-headedness. Negative for headaches.       Objective:   Physical Exam BP (!) 152/82   Pulse 72   Temp 98 F (36.7 C) (Oral)   Resp 18   Ht 5\' 6"  (1.676 m)   Wt 160 lb 6.4 oz (72.8 kg)   SpO2 99%   BMI 25.89 kg/m   Gen- WNWD but small framed older male in NAD; patient is sitting in a chair with his walker nearby Lungs-  CTA CV- RRR, positive murmur ABD- soft and non-tender Back- no CVA tenderness Musculoskeletal- patient with some swelling and tenderness at the MTP joints of the hands right greater than left as well as swelling and tenderness at the PIP and DIP joints and tenderness at the wrists; patient with pain at the joint lines of the knees but no edema or effusions present. Patient has limited ROm of his shoulders/upper arms as he cannot lift them above shoulder level and has pain with attempts to lift his arms any higher EXT- no lower extremity edema        Assessment & Plan:  1. Diabetes mellitus without complication (HCC) Patient with diabetes and Hgb A1c was good at 7.1 and random glucose was 143. Patient will continue his current  medications and continue [facility] - Glucose (CBG) - HgB A1c  2. Pain in joint involving multiple sites Based on exam, I suspect that patient may have both osteoarthritis and an autoimmune or inflammatory arthritis. [facility] is [facility] call the [facility] for a [facility] appointment as he may need rheumatology follow-up. Because of his diabetes, I will not place patient on a prednisone taper to help with the current pain and swelling. Patient is on plavix and therefore NSAID's and similar medications will not be prescribed.  Patient is given a limited supply of tramadol and encouraged to follow-up at the [facility]. - traMADol (ULTRAM) 50 MG tablet; Take 1 tablet (50 mg total) by mouth every 8 (eight) hours as needed for up to 5 days for moderate pain.  Dispense: 15 tablet; Refill: 0  An After Visit Summary was printed and given to the patient.  * Patient believes that he has already had his flu shot for this season  Return if symptoms worsen or fail to improve, for please follow-up with the [facility] .

## 2018-09-09 NOTE — Progress Notes (Signed)
Flu shot: no he gets it at the [facility] Pain: legs and arms. Can hardly raise them per patient patient states he is in a lot of pain and cant take it anymore.  Fall: a couple weeks ago, got out of the bed too fast, no injuries reported.  Patient complained of jock itch  Refill: metformin, liniments cream

## 2018-10-09 ENCOUNTER — Other Ambulatory Visit: Payer: Self-pay

## 2018-10-09 ENCOUNTER — Emergency Department (HOSPITAL_COMMUNITY): Payer: Medicaid Other

## 2018-10-09 ENCOUNTER — Emergency Department (HOSPITAL_COMMUNITY)
Admission: EM | Admit: 2018-10-09 | Discharge: 2018-10-09 | Disposition: A | Discharge status: Other Acute Inpatient Hospital | Payer: Medicaid Other | Attending: Emergency Medicine | Admitting: Emergency Medicine

## 2018-10-09 DIAGNOSIS — F329 Major depressive disorder, single episode, unspecified: Secondary | ICD-10-CM | POA: Diagnosis present

## 2018-10-09 DIAGNOSIS — M25462 Effusion, left knee: Secondary | ICD-10-CM | POA: Diagnosis not present

## 2018-10-09 DIAGNOSIS — F32A Depression, unspecified: Secondary | ICD-10-CM

## 2018-10-09 DIAGNOSIS — E119 Type 2 diabetes mellitus without complications: Secondary | ICD-10-CM | POA: Diagnosis not present

## 2018-10-09 DIAGNOSIS — F17210 Nicotine dependence, cigarettes, uncomplicated: Secondary | ICD-10-CM | POA: Diagnosis not present

## 2018-10-09 DIAGNOSIS — Z79899 Other long term (current) drug therapy: Secondary | ICD-10-CM | POA: Diagnosis not present

## 2018-10-09 DIAGNOSIS — R45851 Suicidal ideations: Secondary | ICD-10-CM | POA: Diagnosis not present

## 2018-10-09 DIAGNOSIS — Z0000 Encounter for general adult medical examination without abnormal findings: Secondary | ICD-10-CM

## 2018-10-09 DIAGNOSIS — Z7984 Long term (current) use of oral hypoglycemic drugs: Secondary | ICD-10-CM | POA: Diagnosis not present

## 2018-10-09 LAB — RAPID URINE DRUG SCREEN, HOSP PERFORMED
Amphetamines: NOT DETECTED
BARBITURATES: NOT DETECTED
Benzodiazepines: NOT DETECTED
Cocaine: POSITIVE — AB
Opiates: NOT DETECTED
Tetrahydrocannabinol: NOT DETECTED

## 2018-10-09 LAB — CBC
HCT: 35.4 % — ABNORMAL LOW (ref 39.0–52.0)
Hemoglobin: 11.7 g/dL — ABNORMAL LOW (ref 13.0–17.0)
MCH: 33.1 pg (ref 26.0–34.0)
MCHC: 33.1 g/dL (ref 30.0–36.0)
MCV: 100 fL (ref 80.0–100.0)
NRBC: 0 % (ref 0.0–0.2)
PLATELETS: 353 10*3/uL (ref 150–400)
RBC: 3.54 MIL/uL — AB (ref 4.22–5.81)
RDW: 12 % (ref 11.5–15.5)
WBC: 14.7 10*3/uL — ABNORMAL HIGH (ref 4.0–10.5)

## 2018-10-09 LAB — COMPREHENSIVE METABOLIC PANEL
ALT: 13 U/L (ref 0–44)
ANION GAP: 12 (ref 5–15)
AST: 14 U/L — ABNORMAL LOW (ref 15–41)
Albumin: 4.2 g/dL (ref 3.5–5.0)
Alkaline Phosphatase: 104 U/L (ref 38–126)
BUN: 21 mg/dL (ref 8–23)
CHLORIDE: 102 mmol/L (ref 98–111)
CO2: 22 mmol/L (ref 22–32)
CREATININE: 1.04 mg/dL (ref 0.61–1.24)
Calcium: 9.7 mg/dL (ref 8.9–10.3)
GFR calc non Af Amer: >60 mL/min (ref >60)
Glucose, Bld: 169 mg/dL — ABNORMAL HIGH (ref 70–99)
Potassium: 4.5 mmol/L (ref 3.5–5.1)
SODIUM: 136 mmol/L (ref 135–145)
Total Bilirubin: 0.7 mg/dL (ref 0.3–1.2)
Total Protein: 7.4 g/dL (ref 6.5–8.1)

## 2018-10-09 LAB — URINALYSIS, ROUTINE W REFLEX MICROSCOPIC
Bilirubin Urine: NEGATIVE
Glucose, UA: NEGATIVE mg/dL
Hgb urine dipstick: NEGATIVE
Ketones, ur: NEGATIVE mg/dL
Leukocytes, UA: NEGATIVE
Nitrite: NEGATIVE
PROTEIN: 30 mg/dL — AB
SPECIFIC GRAVITY, URINE: 1.018 (ref 1.005–1.030)
pH: 5 (ref 5.0–8.0)

## 2018-10-09 LAB — CBG MONITORING, ED
Glucose-Capillary: 172 mg/dL — ABNORMAL HIGH (ref 70–99)
Glucose-Capillary: 126 mg/dL — ABNORMAL HIGH (ref 70–99)

## 2018-10-09 LAB — ACETAMINOPHEN LEVEL

## 2018-10-09 LAB — SALICYLATE LEVEL

## 2018-10-09 LAB — ETHANOL

## 2018-10-09 MED ORDER — HYDROCORTISONE 1 % EX CREA
1.0000 "application " | TOPICAL_CREAM | Freq: Every day | CUTANEOUS | Status: DC | PRN
  Filled 2018-10-09: qty 28

## 2018-10-09 MED ORDER — METFORMIN HCL 500 MG PO TABS
1000.0000 mg | ORAL_TABLET | Freq: Two times a day (BID) | ORAL | Status: DC
  Administered 2018-10-09: 1000 mg via ORAL
  Filled 2018-10-09: qty 2

## 2018-10-09 MED ORDER — AMLODIPINE BESYLATE 5 MG PO TABS
10.0000 mg | ORAL_TABLET | Freq: Every day | ORAL | Status: DC
  Administered 2018-10-09: 10 mg via ORAL
  Filled 2018-10-09: qty 2

## 2018-10-09 MED ORDER — LATANOPROST 0.005 % OP SOLN
1.0000 [drp] | Freq: Every day | OPHTHALMIC | Status: DC
  Filled 2018-10-09: qty 2.5

## 2018-10-09 MED ORDER — BLUE-EMU SUPER STRENGTH EX CREA: 1.0000 "application " | TOPICAL_CREAM | Freq: Every day | CUTANEOUS | Status: DC | PRN

## 2018-10-09 MED ORDER — GLIPIZIDE 10 MG PO TABS
10.0000 mg | ORAL_TABLET | Freq: Two times a day (BID) | ORAL | Status: DC
  Filled 2018-10-09 (×2): qty 1

## 2018-10-09 MED ORDER — ATORVASTATIN CALCIUM 40 MG PO TABS
40.0000 mg | ORAL_TABLET | Freq: Every day | ORAL | Status: DC
  Filled 2018-10-09: qty 1

## 2018-10-09 MED ORDER — METOPROLOL TARTRATE 25 MG PO TABS
12.5000 mg | ORAL_TABLET | Freq: Two times a day (BID) | ORAL | Status: DC
  Administered 2018-10-09: 12.5 mg via ORAL
  Filled 2018-10-09: qty 1

## 2018-10-09 MED ORDER — PANTOPRAZOLE SODIUM 40 MG PO TBEC
40.0000 mg | DELAYED_RELEASE_TABLET | Freq: Every day | ORAL | Status: DC
  Administered 2018-10-09: 40 mg via ORAL
  Filled 2018-10-09: qty 1

## 2018-10-09 MED ORDER — MUSCLE RUB 10-15 % EX CREA
TOPICAL_CREAM | Freq: Every day | CUTANEOUS | Status: DC | PRN
  Filled 2018-10-09: qty 85

## 2018-10-09 MED ORDER — CLOPIDOGREL BISULFATE 75 MG PO TABS
75.0000 mg | ORAL_TABLET | Freq: Every day | ORAL | Status: DC
  Administered 2018-10-09: 75 mg via ORAL
  Filled 2018-10-09: qty 1

## 2018-10-09 MED ORDER — ACETAMINOPHEN 500 MG PO TABS: 1000.0000 mg | ORAL_TABLET | Freq: Every day | ORAL | Status: DC | PRN

## 2018-10-09 MED ORDER — LISINOPRIL 20 MG PO TABS
20.0000 mg | ORAL_TABLET | Freq: Every day | ORAL | Status: DC
  Administered 2018-10-09: 20 mg via ORAL
  Filled 2018-10-09: qty 1

## 2018-10-09 NOTE — ED Notes (Addendum)
Patient arrived from triage in stable condition. Patient very tearful and states he can't live like this anymore. He states he has taken care of himself for 62 years and now he can't do it by himself. He states he really needs to be somewhere that can help him get his strength back. He states that he falls frequently due to weakness and balance issues d/t the 2 strokes he had in May and June. Patient states that he feel earlier and layed on the ground for a couple of hours d/t not being able to get up. He points to abrasions on bilateral knees from his earlier fall. Patient talks about how he served 15 years in the [employer]. I asked him if he had any family to help him. He states he has family but doesn't want to ask them to help. He feels he shouldn't have to ask them because he has been taking care of himself for so long. As he tells me his story, tears are streaming down his face. I try to comfort him and offer encouragement. He appreciates my help. He states he hasn't eaten in a long time and he is diabetic. NT brings him a sandwich, cheese stick and a drink.

## 2018-10-09 NOTE — ED Triage Notes (Signed)
Pt BIB [facility]. He reports SI. He stopped taking his medication yesterday and instead, drank alcohol and did cocaine. He reports that life is not worth living after he gave the [employer] 15 years and the [facility] wont help him. No particular plan for suicide.

## 2018-10-09 NOTE — Progress Notes (Addendum)
Per notes from a previous visit, patient was working with a Health and safety inspector, Tyler Pita [phone]. CSW has attempted to reach out to [facility] regarding patient's current services and has left voicemail for return call. CSW will await return call.  12:06pm- CSW spoke with patient at bedside who expressed frustrations with the [facility]. Per patient, he received rehabilitation in [facility] from 6/1 to 7/1. Patient states he made significant progress and did not feel ready to discharge. Per patient, [facility]. Patient stated multiple times that if he couldn't get his strength back that he had no reason to live. CSW aware patient is actively using substances and would be a barrier to placement.   Update: Patient being recommended for geropsych placement.  Archie Balboa, LCSWA  Clinical Social Work Department  [employer]  [phone]
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
## 2018-10-09 NOTE — BH Assessment (Signed)
Tele Assessment Note   Patient Name: William Gonzalez MRN: [id number] Referring Physician: Alden Hipp Location of Patient: [facility] Location of Provider: [facility]    William Gonzalez is a divorced 62 y.o. male who presents voluntarily to [facility] reporting symptoms of Depression with suicidal ideation. Pt has a history of recent strokes with reduced physical functioning. Pt reports he has problems with balance and weakness. Pt states he has been falling at least once daily. [facility] than [facility] days of [facility] after his strokes. He states he thinks he could get more function back.  Pt reports he stopped his psych medications 4-5 months ago & hasn't taken any rx meds in past few days. Pt cries frequently throughout assessment. He reports current suicidal ideation with plans to overdose. He denies any previous suicide attempts. Pt acknowledges multiple symptoms of Depression, including: isolating himself, anhedonia, feelings of worthlessness, helplessness, & hopelessness, increased tearfulness, feelings of guilt & irritability, & reduced sleep. Pt denies HI/ hx of violence. He denies AVH & other sx of psychosis. Pt states current stressors include loss of physical function, lack of adequate sleep, Depression & physical pain.   Pt states he lives alone in an apartment, and supports include his adult son, pt's brother & sister. 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 Pt has fair insight & judgment. His memory is intact. Pt reports current daily alcohol to cope with loss & pain.  ? MSE: Pt is dressed in scrubs, alert, oriented x4 with somewhat slurred speech and normal motor behavior. Eye contact is good. Pt's mood is depressed & pleasant and affect is depressed. Affect is congruent with mood. Thought process is coherent and relevant. There is no indication pt is currently responding to internal stimuli or experiencing delusional thought content. Pt was  cooperative throughout assessment.   Disposition: William Harps, DO recommends geropsychiatric hospitalization  Diagnosis: F33.2 Major Depressive Disorder, recurrent, severe, without psychotic features  Past Medical History:  Past Medical History:  Diagnosis Date  . Alcohol abuse   . Coronary artery disease   . Diabetes mellitus   . Hypertension   . Stented coronary artery   . Tobacco abuse     Past Surgical History:  Procedure Laterality Date  . CORONARY STENT PLACEMENT      Family History:  Family History  Problem Relation Age of Onset  . Hypertension Mother   . Diabetes Mother   . Hypertension Father   . Diabetes Father   . Hypertension Sister   . Hypertension Brother   . Diabetes Sister   . Diabetes Brother     Social History:  reports that he has been smoking cigarettes. He has been smoking about 0.30 packs per day. He has never used smokeless tobacco. He reports that he drinks about 1.0 standard drinks of alcohol per week. He reports that he has current or past drug history. Drug: Cocaine.  Additional Social History:  Alcohol / Drug Use Pain Medications: See MAR Prescriptions: See MAR Over the Counter: See MAR History of alcohol / drug use?: Yes Longest period of sobriety (when/how long): unknown Negative Consequences of Use: Financial, Personal relationships Substance #1 Name of Substance 1: Alcohol 1 - Frequency: per pt, as often as he can 1 - Last Use / Amount: 10/08/18; 5th vodka & 12 pk beer Substance #2 Name of Substance 2: cocaine 2 - Amount (size/oz): UTA 2 - Frequency: "every now &  then" 2 - Last Use / Amount: 10/08/18  CIWA: CIWA-Ar BP: (!) 148/73 Pulse Rate: 78 COWS:    Allergies:  Allergies  Allergen Reactions  . Iodine Anaphylaxis and Swelling    Swells all over    Home Medications:  (Not in a hospital admission)  OB/GYN Status:  No LMP for male patient.  General Assessment Data Assessment unable to be completed: Yes Reason  for not completing assessment: sleeping pt. unable to awaken. Location of Assessment: [facility] TTS Assessment: In system Is this a Tele or Face-to-Face Assessment?: Face-to-Face Is this an Initial Assessment or a Re-assessment for this encounter?: Initial Assessment Patient Accompanied by:: N/A Marital status: Divorced Living Arrangements: Other (Comment), Alone(in apartment) Can pt return to current living arrangement?: Yes Admission Status: Voluntary Is patient capable of signing voluntary admission?: Yes Referral Source: Self/Family/Friend Insurance type: [facility], medicaid     Crisis Care Plan Living Arrangements: Other (Comment), Alone(in apartment) [facility] of Psychiatrist: [facility] Therapist: [facility]  Education Status Is patient currently in school?: No Is the patient employed, 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 : No Suicidal Intent: No Has patient had any suicidal intent within the past 6 months prior to admission? : No Is patient at risk for suicide?: Yes Suicidal Plan?: Yes-Currently Present Has patient had any suicidal plan within the past 6 months prior to admission? : Yes Specify Current Suicidal Plan: Overdose Access to Means: Yes What has been your use of drugs/alcohol within the last 12 months?: alcohol & cocaine Previous Attempts/Gestures: No How many times?: 0 Intentional Self Injurious Behavior: None Family Suicide History: No Recent stressful life event(s): Recent negative physical changes(recent stokes) Persecutory voices/beliefs?: No Depression: Yes Depression Symptoms: Despondent, Insomnia, Tearfulness, Isolating, Fatigue, Loss of interest in usual pleasures, Feeling worthless/self pity, Feeling angry/irritable Substance abuse history and/or treatment for substance abuse?: Yes Suicide  prevention information given to non-admitted patients: Not applicable  Risk to Others within the past 6 months Homicidal Ideation: No Does patient have any lifetime risk of violence toward others beyond the six months prior to admission? : No Thoughts of Harm to Others: No Current Homicidal Intent: No Current Homicidal Plan: No Access to Homicidal Means: No History of harm to others?: No Assessment of Violence: None Noted Does patient have access to weapons?: No(states he no longer has firearms) Criminal Charges Pending?: No Does patient have a court date: No Is patient on probation?: No  Psychosis Hallucinations: None noted Delusions: None noted  Mental Status Report Appearance/Hygiene: Disheveled Eye Contact: Good Motor Activity: Freedom of movement Speech: Logical/coherent, Slurred Level of Consciousness: Crying, Alert Mood: Depressed, Despair, Pleasant Affect: Depressed, Sad Anxiety Level: Minimal Thought Processes: Coherent, Relevant Judgement: Partial Orientation: Person, Place, Time, Situation Obsessive Compulsive Thoughts/Behaviors: None  Cognitive Functioning Concentration: Good Memory: Recent Intact, Remote Intact Is patient IDD: No Insight: Fair Impulse Control: Fair Appetite: Good Have you had any weight changes? : No Change Sleep: Decreased Total Hours of Sleep: 3(interrupted often)  ADLScreening [facility] Assessment Services) Patient's cognitive ability adequate to safely complete daily activities?: Yes Patient able to express need for assistance with ADLs?: Yes Independently performs ADLs?: Yes (appropriate for developmental age)  Prior Inpatient Therapy Prior Inpatient Therapy: Yes Prior Therapy Dates: multiple [facility] Reason for Treatment: various  Prior Outpatient Therapy Prior Outpatient Therapy: (unknown)  ADL Screening (condition at time of admission) Patient's cognitive ability adequate to safely complete daily  activities?: Yes Is the patient deaf or have difficulty hearing?: No Does the patient have difficulty seeing, even when wearing glasses/contacts?: No Does the patient have difficulty concentrating, remembering, or making decisions?: No Patient able to express need for assistance with ADLs?: Yes Does the patient have difficulty dressing or bathing?: No Independently performs ADLs?: Yes (appropriate for developmental age) Does the patient have difficulty walking or climbing stairs?: Yes Weakness of Legs: Both Weakness of Arms/Hands: None  Home Assistive Devices/Equipment Home Assistive Devices/Equipment: Cane (specify quad or straight)  Therapy Consults (therapy consults require a physician order) PT Evaluation Needed: Yes (Comment) OT Evalulation Needed: No SLP Evaluation Needed: No Abuse/Neglect Assessment (Assessment to be complete while patient is alone) Abuse/Neglect Assessment Can Be Completed: Yes Physical Abuse: Denies Verbal Abuse: Denies Sexual Abuse: Denies Exploitation of patient/patient's resources: Denies Self-Neglect: Denies Values / Beliefs Cultural Requests During Hospitalization: None Spiritual Requests During Hospitalization: None Consults Spiritual Care Consult Needed: No Social Work Consult Needed: No Merchant navy officer (For Healthcare) Does Patient Have a Medical Advance Directive?: No Would patient like information on creating a medical advance directive?: No - Patient declined          Disposition: William Harps, DO recommends geropsychiatric hospitalization Disposition Initial Assessment Completed for this Encounter: Yes Disposition of Patient: Admit(Geropsych inpatient admission) Type of inpatient treatment program: (Geropsychiatric)    Braydyn Schultes H Wava Kildow 10/09/2018 12:10 PM

## 2018-10-09 NOTE — BH Assessment (Addendum)
[facility] Assessment Progress Note  Per Juanetta Beets, DO, this pt does not require psychiatric hospitalization at this time.  Pt is to be discharged from [facility] with referral information for area [place] system resources.  This has been included in pt's discharge instructions.  Archie Balboa, LCSW agrees to meet with pt to discuss further psychosocial needs.  Pt's nurse has been notified.  Doylene Canning, MA Triage Specialist [phone]   Addendum:  After further review, it has been determined that requires psychiatric hospitalization at a facility that provides specialty services for geriatric patients.  This Clinical research associate will pursue placement for pt.  Pt's nurse has been notified.  Doylene Canning, [place] Behavioral Health Coordinator [phone]

## 2018-10-09 NOTE — ED Provider Notes (Addendum)
[facility]-EMERGENCY DEPT Provider Note   CSN: [id number] Arrival date & time: 10/09/18  0542     History   Chief Complaint Chief Complaint  Patient presents with  . Suicidal    HPI William Gonzalez is a 61 y.o. male.  62 year old male presents with complaint of depression, feeling like "I cannot do this anymore."  Patient states that he had 2 strokes in May and June, states each time he was discharged home to complete outpatient therapy, patient feels like he should have been sent for residential therapy to regain his strength so that he could care for himself.  Patient states he is living in a hotel, falls frequently and has difficulty ambulating with his cane.  Patient has tried to be compliant with in-home therapy but feels like this is not helping him.  Patient [facility] and feels like he is unable to get any help, which makes him feel like giving up.  Patient reports pain in his joints related to arthritis, is frustrated he has not had any x-rays, but frustrated that he has been treated with Tylenol which does not relieve his pain, reports difficulty sleeping at night secondary to pain.  Admits to drug and alcohol used to treat his pain.  Reports suicidal ideation without plan, denies homicidal ideation, denies auditory visual his nation.  No other complaints or concerns today.     Past Medical History:  Diagnosis Date  . Alcohol abuse   . Coronary artery disease   . Diabetes mellitus   . Hypertension   . Stented coronary artery   . Tobacco abuse     Patient Active Problem List   Diagnosis Date Noted  . Anemia 04/18/2018  . Ataxic gait   . Cocaine abuse (HCC)   . Cerebellar infarct (HCC) 04/21/2015  . Tobacco abuse 04/21/2015  . Alcohol abuse 04/21/2015  . Stroke (HCC) 04/21/2015  . Diabetes mellitus without complication (HCC) 04/21/2015  . Coronary artery disease   . Hypertension     Past  Surgical History:  Procedure Laterality Date  . CORONARY STENT PLACEMENT          Home Medications    Prior to Admission medications   Medication Sig Start Date End Date Taking? Authorizing Provider  acetaminophen (TYLENOL) 500 MG tablet Take 1,000 mg by mouth daily as needed for mild pain.    Yes [provider]  amLODipine (NORVASC) 10 MG tablet Take 1 tablet (10 mg total) by mouth daily. 04/22/18  Yes Burnadette Pop, MD  atorvastatin (LIPITOR) 40 MG tablet Take 1 tablet (40 mg total) by mouth daily at 6 PM. 04/21/18  Yes Adhikari, Amrit, MD  clopidogrel (PLAVIX) 75 MG tablet Take 1 tablet (75 mg total) by mouth daily. 04/22/18  Yes Burnadette Pop, MD  glipiZIDE (GLUCOTROL) 10 MG tablet Take 10 mg by mouth 2 (two) times daily before a meal.   Yes [provider]  hydrocortisone cream 1 % Apply 1 application topically daily as needed for itching (rash).    Yes [provider]  latanoprost (XALATAN) 0.005 % ophthalmic solution Place 1 drop into both eyes at bedtime.   Yes [provider]  Liniments (BLUE-EMU SUPER STRENGTH) CREA Apply 1 application topically daily as needed (shoulder or back pain).    Yes [provider]  lisinopril (PRINIVIL,ZESTRIL) 20 MG tablet Take 1 tablet (20 mg total) by mouth daily. 04/21/18  Yes Burnadette Pop, MD  metFORMIN (  GLUCOPHAGE) 1000 MG tablet Take 1 tablet (1,000 mg total) by mouth 2 (two) times daily with a meal. 07/04/12  Yes Molpus, John, MD  metoprolol tartrate (LOPRESSOR) 25 MG tablet Take 12.5 mg by mouth 2 (two) times daily.   Yes [provider]  omeprazole (PRILOSEC) 20 MG capsule Take 20 mg by mouth daily.   Yes [provider]  sertraline (ZOLOFT) 100 MG tablet Take 1 tablet (100 mg total) by mouth 2 (two) times daily. Patient not taking: Reported on 04/21/2015 07/04/12   Molpus, Jonny Ruiz, MD    Family History Family History  Problem Relation Age of Onset  . Hypertension Mother   .  Diabetes Mother   . Hypertension Father   . Diabetes Father   . Hypertension Sister   . Hypertension Brother   . Diabetes Sister   . Diabetes Brother     Social History Social History   Tobacco Use  . Smoking status: Current Every Day Smoker    Packs/day: 0.30    Types: Cigarettes  . Smokeless tobacco: Never Used  Substance Use Topics  . Alcohol use: Yes    Alcohol/week: 1.0 standard drinks    Types: 1 Cans of beer per week    Comment: occassionally   . Drug use: Yes    Types: Cocaine     Allergies   Iodine   Review of Systems Review of Systems  Constitutional: Negative for fever.  Respiratory: Negative for shortness of breath.   Cardiovascular: Negative for chest pain.  Gastrointestinal: Negative for abdominal pain.  Musculoskeletal: Positive for arthralgias, gait problem and joint swelling.  Skin: Negative for rash and wound.  Allergic/Immunologic: Negative for immunocompromised state.  Neurological: Positive for weakness.  Psychiatric/Behavioral: Positive for sleep disturbance and suicidal ideas. Negative for confusion and hallucinations.  All other systems reviewed and are negative.    Physical Exam Updated Vital Signs BP 117/65 (BP Location: Right Arm)   Pulse 75   Temp 98.6 F (37 C) (Oral)   Resp 20   SpO2 90%   Physical Exam  Constitutional: He is oriented to person, place, and time. He appears well-developed and well-nourished.  HENT:  Head: Normocephalic and atraumatic.  Eyes: Pupils are equal, round, and reactive to light. EOM are normal.  Cardiovascular: Normal rate, regular rhythm, normal heart sounds and intact distal pulses.  No murmur heard. Pulmonary/Chest: Effort normal and breath sounds normal. No respiratory distress.  Abdominal: Soft. There is no tenderness.  Musculoskeletal: He exhibits tenderness. He exhibits no deformity.       Right knee: He exhibits normal range of motion, no swelling and no effusion. Tenderness found. Medial  joint line and lateral joint line tenderness noted.       Left knee: He exhibits decreased range of motion, swelling and effusion. Tenderness found. Medial joint line and lateral joint line tenderness noted.  Neurological: He is alert and oriented to person, place, and time. No sensory deficit.  Skin: Skin is warm and dry.  Psychiatric: His speech is normal. Judgment normal. His affect is angry. He is agitated. Thought content is not paranoid. Cognition and memory are normal. He exhibits a depressed mood. He expresses suicidal ideation. He expresses no homicidal ideation. He expresses no suicidal plans and no homicidal plans.  Nursing note and vitals reviewed.    ED Treatments / Results  Labs (all labs ordered are listed, but only abnormal results are displayed) Labs Reviewed  COMPREHENSIVE METABOLIC PANEL - Abnormal; Notable for the following  components:      Result Value   Glucose, Bld 169 (*)    AST 14 (*)    All other components within normal limits  ACETAMINOPHEN LEVEL - Abnormal; Notable for the following components:   Acetaminophen (Tylenol), Serum <10 (*)    All other components within normal limits  CBC - Abnormal; Notable for the following components:   WBC 14.7 (*)    RBC 3.54 (*)    Hemoglobin 11.7 (*)    HCT 35.4 (*)    All other components within normal limits  RAPID URINE DRUG SCREEN, HOSP PERFORMED - Abnormal; Notable for the following components:   Cocaine POSITIVE (*)    All other components within normal limits  URINALYSIS, ROUTINE W REFLEX MICROSCOPIC - Abnormal; Notable for the following components:   Protein, ur 30 (*)    Bacteria, UA RARE (*)    All other components within normal limits  CBG MONITORING, ED - Abnormal; Notable for the following components:   Glucose-Capillary 172 (*)    All other components within normal limits  CBG MONITORING, ED - Abnormal; Notable for the following components:   Glucose-Capillary 126 (*)    All other components within  normal limits  ETHANOL  SALICYLATE LEVEL    EKG EKG Interpretation  Date/Time:  Thursday October 09 2018 12:13:56 EDT Ventricular Rate:  76 PR Interval:  164 QRS Duration: 126 QT Interval:  368 QTC Calculation: 414 R Axis:   -81 Text Interpretation:  Normal sinus rhythm Left axis deviation Non-specific intra-ventricular conduction block T wave abnormality, consider inferolateral ischemia Abnormal ECG since last tracing no significant change Confirmed by Mancel Bale [phone]) on 10/09/2018 1:32:03 PM   Radiology No results found.  Procedures Procedures (including critical care time)  Medications Ordered in ED Medications - No data to display   Initial Impression / Assessment and Plan / ED Course  I have reviewed the triage vital signs and the nursing notes.  Pertinent labs & imaging results that were available during my care of the patient were reviewed by me and considered in my medical decision making (see chart for details).  Clinical Course as of Oct 12 1439  Thu Oct 09, 2018  1243 62yo male presents with depression, difficulty sleeping, suicidal ideation without plan. He is frustrated and lacks hope. Problems seem to come from frustration with the care received following CVA x 2 earlier this year. States he fell recently and has pain in his knees, L>R, XR knees with arthritic changes, left knee effusion. Patient is medically cleared for [facility] evaluation.    [LM]  1509 Review of behavioral health notes, plan is to admit patient.    [LM]    Clinical Course User Index [LM] Jeannie Fend, PA-C   Final Clinical Impressions(s) / ED Diagnoses   Final diagnoses:  Depression, unspecified depression type  Suicidal ideation  Effusion of bursa of left knee    ED Discharge Orders    None       Alden Hipp 10/09/18 1510    Gerhard Munch, MD 10/09/18 1611    Jeannie Fend, PA-C 10/11/18 1438    Jeannie Fend, PA-C 10/11/18 1440      Gerhard Munch, MD 10/22/18 [phone]

## 2018-10-09 NOTE — BH Assessment (Signed)
Pt sleeping soundly. Did not respond at all to his name being called. Will re-attempt TTS assessment later.

## 2018-10-09 NOTE — BH Assessment (Signed)
[facility] Assessment Progress Note  Per Juanetta Beets, DO, this pt requires psychiatric hospitalization at this time.  At 16:25 Meg calls from [facility].  Pt has been accepted to their facility by Dr Loann Quill.  EDP Tilden Fossa, MD, concurs with this disposition, as does the pt who is currently under voluntary status.  Pt's nurse, Kendal Hymen, has been notified, and agrees to call report to [phone].  Pt is to be transported via [place].  [facility] stipulates that pt must arrive either before 23:00 tonight, or after 06:00 tomorrow.    Doylene Canning, [place] Behavioral Health Coordinator [phone]

## 2018-10-09 NOTE — BH Assessment (Signed)
[facility] Assessment Progress Note  Per Juanetta Beets, DO, this pt requires psychiatric hospitalization at this time.  The following facilities have been contacted to seek placement for this pt, with results as noted:  Beds available, information sent, decision pending:  [facility]   At capacity:  [facility]   Doylene Canning, [place] [id number] Health Coordinator [phone]

## 2018-10-22 ENCOUNTER — Telehealth: Payer: Self-pay | Admitting: Licensed Clinical Social Worker

## 2018-10-22 NOTE — Telephone Encounter (Signed)
LCSWA placed call to patient to follow up on behavioral health. A voicemail was not set up; therefore, LCSWA was unable to leave message for a return call. LCSWA will make an additional attempt to contact pt.

## 2018-10-23 ENCOUNTER — Telehealth: Payer: Self-pay | Admitting: Licensed Clinical Social Worker

## 2018-10-23 NOTE — Telephone Encounter (Signed)
LCSWA placed call to patient to follow up on behavioral health. A voicemail was not set up; therefore, LCSWA was unable to leave message for a return call.

## 2019-04-08 ENCOUNTER — Ambulatory Visit (HOSPITAL_COMMUNITY)
Admission: RE | Admit: 2019-04-08 | Discharge: 2019-04-08 | Disposition: A | Discharge status: Home / Self Care | Payer: Medicare Other | Source: Home / Self Care | Attending: Psychiatry | Admitting: Psychiatry

## 2019-04-08 ENCOUNTER — Inpatient Hospital Stay (HOSPITAL_COMMUNITY)
Admission: AD | Admit: 2019-04-08 | Discharge: 2019-04-13 | DRG: 885 | Disposition: A | Discharge status: Home / Self Care | Payer: Medicare Other | Source: Intra-hospital | Attending: Psychiatry | Admitting: Psychiatry

## 2019-04-08 ENCOUNTER — Encounter (HOSPITAL_COMMUNITY): Payer: Self-pay | Admitting: *Deleted

## 2019-04-08 ENCOUNTER — Other Ambulatory Visit: Payer: Self-pay

## 2019-04-08 ENCOUNTER — Emergency Department (HOSPITAL_COMMUNITY)
Admission: EM | Admit: 2019-04-08 | Discharge: 2019-04-08 | Disposition: A | Discharge status: Other Acute Inpatient Hospital | Payer: Medicare Other | Attending: Emergency Medicine | Admitting: Emergency Medicine

## 2019-04-08 DIAGNOSIS — E119 Type 2 diabetes mellitus without complications: Secondary | ICD-10-CM | POA: Insufficient documentation

## 2019-04-08 DIAGNOSIS — Z590 Homelessness: Secondary | ICD-10-CM

## 2019-04-08 DIAGNOSIS — Z79899 Other long term (current) drug therapy: Secondary | ICD-10-CM | POA: Insufficient documentation

## 2019-04-08 DIAGNOSIS — I2510 Atherosclerotic heart disease of native coronary artery without angina pectoris: Secondary | ICD-10-CM | POA: Insufficient documentation

## 2019-04-08 DIAGNOSIS — F17210 Nicotine dependence, cigarettes, uncomplicated: Secondary | ICD-10-CM | POA: Diagnosis present

## 2019-04-08 DIAGNOSIS — I10 Essential (primary) hypertension: Secondary | ICD-10-CM | POA: Diagnosis present

## 2019-04-08 DIAGNOSIS — F332 Major depressive disorder, recurrent severe without psychotic features: Principal | ICD-10-CM | POA: Diagnosis present

## 2019-04-08 DIAGNOSIS — E7800 Pure hypercholesterolemia, unspecified: Secondary | ICD-10-CM | POA: Diagnosis present

## 2019-04-08 DIAGNOSIS — R45851 Suicidal ideations: Secondary | ICD-10-CM | POA: Diagnosis present

## 2019-04-08 DIAGNOSIS — Z888 Allergy status to other drugs, medicaments and biological substances status: Secondary | ICD-10-CM | POA: Diagnosis not present

## 2019-04-08 DIAGNOSIS — Z8249 Family history of ischemic heart disease and other diseases of the circulatory system: Secondary | ICD-10-CM | POA: Diagnosis not present

## 2019-04-08 DIAGNOSIS — Z9114 Patient's other noncompliance with medication regimen: Secondary | ICD-10-CM | POA: Diagnosis not present

## 2019-04-08 DIAGNOSIS — Z915 Personal history of self-harm: Secondary | ICD-10-CM

## 2019-04-08 DIAGNOSIS — Z7902 Long term (current) use of antithrombotics/antiplatelets: Secondary | ICD-10-CM

## 2019-04-08 DIAGNOSIS — H409 Unspecified glaucoma: Secondary | ICD-10-CM | POA: Diagnosis present

## 2019-04-08 DIAGNOSIS — F1410 Cocaine abuse, uncomplicated: Secondary | ICD-10-CM | POA: Diagnosis present

## 2019-04-08 DIAGNOSIS — G4700 Insomnia, unspecified: Secondary | ICD-10-CM | POA: Diagnosis present

## 2019-04-08 DIAGNOSIS — Z7984 Long term (current) use of oral hypoglycemic drugs: Secondary | ICD-10-CM | POA: Insufficient documentation

## 2019-04-08 DIAGNOSIS — E785 Hyperlipidemia, unspecified: Secondary | ICD-10-CM | POA: Diagnosis present

## 2019-04-08 DIAGNOSIS — F32A Depression, unspecified: Secondary | ICD-10-CM

## 2019-04-08 DIAGNOSIS — R45850 Homicidal ideations: Secondary | ICD-10-CM | POA: Diagnosis present

## 2019-04-08 DIAGNOSIS — I69354 Hemiplegia and hemiparesis following cerebral infarction affecting left non-dominant side: Secondary | ICD-10-CM

## 2019-04-08 DIAGNOSIS — F1020 Alcohol dependence, uncomplicated: Secondary | ICD-10-CM | POA: Insufficient documentation

## 2019-04-08 DIAGNOSIS — Z8673 Personal history of transient ischemic attack (TIA), and cerebral infarction without residual deficits: Secondary | ICD-10-CM | POA: Insufficient documentation

## 2019-04-08 DIAGNOSIS — K219 Gastro-esophageal reflux disease without esophagitis: Secondary | ICD-10-CM | POA: Diagnosis present

## 2019-04-08 DIAGNOSIS — Z955 Presence of coronary angioplasty implant and graft: Secondary | ICD-10-CM

## 2019-04-08 DIAGNOSIS — F329 Major depressive disorder, single episode, unspecified: Secondary | ICD-10-CM

## 2019-04-08 DIAGNOSIS — Z833 Family history of diabetes mellitus: Secondary | ICD-10-CM | POA: Diagnosis not present

## 2019-04-08 LAB — RAPID URINE DRUG SCREEN, HOSP PERFORMED
Amphetamines: NOT DETECTED
Barbiturates: NOT DETECTED
Benzodiazepines: NOT DETECTED
Cocaine: POSITIVE — AB
Opiates: NOT DETECTED
Tetrahydrocannabinol: NOT DETECTED

## 2019-04-08 LAB — CBC WITH DIFFERENTIAL/PLATELET
Abs Immature Granulocytes: 0.05 10*3/uL (ref 0.00–0.07)
Basophils Absolute: 0 10*3/uL (ref 0.0–0.1)
Basophils Relative: 0 %
Eosinophils Absolute: 0.3 10*3/uL (ref 0.0–0.5)
Eosinophils Relative: 2 %
HCT: 38.9 % — ABNORMAL LOW (ref 39.0–52.0)
Hemoglobin: 12.8 g/dL — ABNORMAL LOW (ref 13.0–17.0)
Immature Granulocytes: 0 %
Lymphocytes Relative: 15 %
Lymphs Abs: 2 10*3/uL (ref 0.7–4.0)
MCH: 32.8 pg (ref 26.0–34.0)
MCHC: 32.9 g/dL (ref 30.0–36.0)
MCV: 99.7 fL (ref 80.0–100.0)
Monocytes Absolute: 0.8 10*3/uL (ref 0.1–1.0)
Monocytes Relative: 6 %
Neutro Abs: 10.5 10*3/uL — ABNORMAL HIGH (ref 1.7–7.7)
Neutrophils Relative %: 77 %
Platelets: 324 10*3/uL (ref 150–400)
RBC: 3.9 MIL/uL — ABNORMAL LOW (ref 4.22–5.81)
RDW: 11.9 % (ref 11.5–15.5)
WBC: 13.8 10*3/uL — ABNORMAL HIGH (ref 4.0–10.5)
nRBC: 0 % (ref 0.0–0.2)

## 2019-04-08 LAB — COMPREHENSIVE METABOLIC PANEL
ALT: 14 U/L (ref 0–44)
AST: 12 U/L — ABNORMAL LOW (ref 15–41)
Albumin: 4.2 g/dL (ref 3.5–5.0)
Alkaline Phosphatase: 110 U/L (ref 38–126)
Anion gap: 9 (ref 5–15)
BUN: 28 mg/dL — ABNORMAL HIGH (ref 8–23)
CO2: 21 mmol/L — ABNORMAL LOW (ref 22–32)
Calcium: 9.4 mg/dL (ref 8.9–10.3)
Chloride: 104 mmol/L (ref 98–111)
Creatinine, Ser: 1.38 mg/dL — ABNORMAL HIGH (ref 0.61–1.24)
GFR calc Af Amer: >60 mL/min (ref >60)
GFR calc non Af Amer: 53 mL/min — ABNORMAL LOW (ref >60)
Glucose, Bld: 251 mg/dL — ABNORMAL HIGH (ref 70–99)
Potassium: 4.5 mmol/L (ref 3.5–5.1)
Sodium: 134 mmol/L — ABNORMAL LOW (ref 135–145)
Total Bilirubin: 0.3 mg/dL (ref 0.3–1.2)
Total Protein: 7.3 g/dL (ref 6.5–8.1)

## 2019-04-08 LAB — ETHANOL: Alcohol, Ethyl (B): <10 mg/dL (ref <10)

## 2019-04-08 MED ORDER — LATANOPROST 0.005 % OP SOLN
1.0000 [drp] | Freq: Every day | OPHTHALMIC | Status: DC
  Filled 2019-04-08: qty 2.5

## 2019-04-08 MED ORDER — CLOPIDOGREL BISULFATE 75 MG PO TABS
75.0000 mg | ORAL_TABLET | Freq: Every day | ORAL | Status: DC
  Administered 2019-04-09 – 2019-04-13 (×5): 75 mg via ORAL
  Filled 2019-04-08 (×10): qty 1

## 2019-04-08 MED ORDER — ALUM & MAG HYDROXIDE-SIMETH 200-200-20 MG/5ML PO SUSP: 30.0000 mL | Freq: Four times a day (QID) | ORAL | Status: DC | PRN

## 2019-04-08 MED ORDER — CLOPIDOGREL BISULFATE 75 MG PO TABS
75.0000 mg | ORAL_TABLET | Freq: Every day | ORAL | Status: DC
  Administered 2019-04-08: 75 mg via ORAL
  Filled 2019-04-08: qty 1

## 2019-04-08 MED ORDER — GLIPIZIDE 10 MG PO TABS
10.0000 mg | ORAL_TABLET | Freq: Two times a day (BID) | ORAL | Status: DC
  Filled 2019-04-08: qty 1

## 2019-04-08 MED ORDER — METFORMIN HCL 500 MG PO TABS
1000.0000 mg | ORAL_TABLET | Freq: Two times a day (BID) | ORAL | Status: DC
  Filled 2019-04-08: qty 2

## 2019-04-08 MED ORDER — METOPROLOL TARTRATE 25 MG PO TABS
12.5000 mg | ORAL_TABLET | Freq: Two times a day (BID) | ORAL | Status: DC
  Administered 2019-04-08: 12.5 mg via ORAL
  Filled 2019-04-08: qty 1

## 2019-04-08 MED ORDER — LISINOPRIL 20 MG PO TABS
20.0000 mg | ORAL_TABLET | Freq: Every day | ORAL | Status: DC
  Administered 2019-04-08: 20 mg via ORAL
  Filled 2019-04-08: qty 1

## 2019-04-08 MED ORDER — ACETAMINOPHEN 325 MG PO TABS: 650.0000 mg | ORAL_TABLET | ORAL | Status: DC | PRN

## 2019-04-08 MED ORDER — ONDANSETRON HCL 4 MG PO TABS: 4.0000 mg | ORAL_TABLET | Freq: Three times a day (TID) | ORAL | Status: DC | PRN

## 2019-04-08 MED ORDER — AMLODIPINE BESYLATE 5 MG PO TABS
10.0000 mg | ORAL_TABLET | Freq: Every day | ORAL | Status: DC
  Administered 2019-04-08: 10 mg via ORAL
  Filled 2019-04-08: qty 2

## 2019-04-08 MED ORDER — LATANOPROST 0.005 % OP SOLN
1.0000 [drp] | Freq: Every day | OPHTHALMIC | Status: DC
  Administered 2019-04-09 – 2019-04-12 (×4): 1 [drp] via OPHTHALMIC
  Filled 2019-04-08 (×2): qty 2.5

## 2019-04-08 MED ORDER — ATORVASTATIN CALCIUM 40 MG PO TABS
40.0000 mg | ORAL_TABLET | Freq: Every day | ORAL | Status: DC
  Filled 2019-04-08: qty 1

## 2019-04-08 MED ORDER — NICOTINE 21 MG/24HR TD PT24: 21.0000 mg | MEDICATED_PATCH | Freq: Every day | TRANSDERMAL | Status: DC

## 2019-04-08 MED ORDER — ACETAMINOPHEN 325 MG PO TABS
650.0000 mg | ORAL_TABLET | ORAL | Status: DC | PRN
  Administered 2019-04-09 – 2019-04-11 (×4): 650 mg via ORAL
  Filled 2019-04-08 (×4): qty 2

## 2019-04-08 MED ORDER — LISINOPRIL 20 MG PO TABS
20.0000 mg | ORAL_TABLET | Freq: Every day | ORAL | Status: DC
  Administered 2019-04-09 – 2019-04-13 (×5): 20 mg via ORAL
  Filled 2019-04-08 (×9): qty 1

## 2019-04-08 MED ORDER — PANTOPRAZOLE SODIUM 40 MG PO TBEC
40.0000 mg | DELAYED_RELEASE_TABLET | Freq: Every day | ORAL | Status: DC
  Administered 2019-04-09 – 2019-04-13 (×5): 40 mg via ORAL
  Filled 2019-04-08 (×9): qty 1

## 2019-04-08 MED ORDER — NICOTINE 21 MG/24HR TD PT24
21.0000 mg | MEDICATED_PATCH | Freq: Every day | TRANSDERMAL | Status: DC
  Administered 2019-04-09 – 2019-04-13 (×5): 21 mg via TRANSDERMAL
  Filled 2019-04-08 (×8): qty 1

## 2019-04-08 MED ORDER — ACETAMINOPHEN 325 MG PO TABS: 650.0000 mg | ORAL_TABLET | Freq: Four times a day (QID) | ORAL | Status: DC | PRN

## 2019-04-08 MED ORDER — MAGNESIUM HYDROXIDE 400 MG/5ML PO SUSP: 30.0000 mL | Freq: Every day | ORAL | Status: DC | PRN

## 2019-04-08 MED ORDER — ATORVASTATIN CALCIUM 40 MG PO TABS
40.0000 mg | ORAL_TABLET | Freq: Every day | ORAL | Status: DC
  Administered 2019-04-09 – 2019-04-13 (×4): 40 mg via ORAL
  Filled 2019-04-08 (×7): qty 1

## 2019-04-08 MED ORDER — AMLODIPINE BESYLATE 10 MG PO TABS
10.0000 mg | ORAL_TABLET | Freq: Every day | ORAL | Status: DC
  Administered 2019-04-09 – 2019-04-13 (×5): 10 mg via ORAL
  Filled 2019-04-08 (×2): qty 1
  Filled 2019-04-08: qty 2
  Filled 2019-04-08 (×5): qty 1
  Filled 2019-04-08 (×2): qty 2

## 2019-04-08 MED ORDER — ALUM & MAG HYDROXIDE-SIMETH 200-200-20 MG/5ML PO SUSP: 30.0000 mL | ORAL | Status: DC | PRN

## 2019-04-08 MED ORDER — PANTOPRAZOLE SODIUM 40 MG PO TBEC
40.0000 mg | DELAYED_RELEASE_TABLET | Freq: Every day | ORAL | Status: DC
  Administered 2019-04-08: 13:00:00 40 mg via ORAL
  Filled 2019-04-08: qty 1

## 2019-04-08 MED ORDER — GLIPIZIDE 5 MG PO TABS
10.0000 mg | ORAL_TABLET | Freq: Two times a day (BID) | ORAL | Status: DC
  Administered 2019-04-09 – 2019-04-13 (×9): 10 mg via ORAL
  Filled 2019-04-08 (×14): qty 2
  Filled 2019-04-08: qty 1

## 2019-04-08 MED ORDER — METFORMIN HCL 500 MG PO TABS
1000.0000 mg | ORAL_TABLET | Freq: Two times a day (BID) | ORAL | Status: DC
  Administered 2019-04-09 – 2019-04-13 (×9): 1000 mg via ORAL
  Filled 2019-04-08 (×14): qty 2

## 2019-04-08 NOTE — H&P (Signed)
Behavioral Health Medical Screening Exam  William Gonzalez is an 63 y.o. male. Pt was seen at [facility] as a walk-in. He called the [place] hotline and reported he was suicidal and homicidal. He has no plans. He stated he was angry with his nephew who he lives with but he did not say why. Pt has been drinking at least a pint of liquor ans 10-12 beers daily. Yesterday he fell at home. Pt is on blood thinners.   Total Time spent with patient: 30 minutes  Psychiatric Specialty Exam: Physical Exam  Constitutional: He appears well-developed and well-nourished.  HENT:  Head: Normocephalic.  Respiratory: Effort normal.  Musculoskeletal:     Comments: Walks with a walker   Psychiatric: His speech is normal. His mood appears anxious. He is slowed. Cognition and memory are impaired (due to alcohol consumption). He expresses homicidal and suicidal ideation.    Review of Systems  Psychiatric/Behavioral: Positive for depression, substance abuse and suicidal ideas.  All other systems reviewed and are negative.   There were no vitals taken for this visit.There is no height or weight on file to calculate BMI.  General Appearance: Casual  Eye Contact:  Good  Speech:  Clear and Coherent and Slurred  Volume:  Normal  Mood:  Anxious  Affect:  Congruent  Thought Process:  Coherent and Descriptions of Associations: Intact  Orientation:  Full (Time, Place, and Person)  Thought Content:  Logical  Suicidal Thoughts:  Yes.  without intent/plan  Homicidal Thoughts:  Yes.  without intent/plan  Memory:  Immediate;   Good Recent;   Fair Remote;   Fair  Judgement:  Impaired  Insight:  Present  Psychomotor Activity:  Shuffling Gait and walks with a walker, s/p stroke  Concentration: Concentration: Fair and Attention Span: Fair  Recall:  Good  Fund of Knowledge:Good  Language: Good  Akathisia:  No  Handed:  Right  AIMS (if indicated):     Assets:  Medical laboratory scientific officer Housing Social Support  Sleep:      Musculoskeletal: Strength & Muscle Tone: decreased and s/p stroke, shuffling gait Gait & Station: shuffle Patient leans: N/A  There were no vitals taken for this visit.  Recommendations:  Based on my evaluation the patient does not appear to have an emergency medical condition. pt sent to [facility] for medical clearance.   Laveda Abbe, NP 04/08/2019, 11:46 AM

## 2019-04-08 NOTE — Tx Team (Signed)
Initial Treatment Plan 04/08/2019 7:16 PM KWESI SLICER [id number]    PATIENT STRESSORS: Health problems Marital or family conflict Substance abuse   PATIENT STRENGTHS: Ability for insight Average or above average intelligence Capable of independent living General fund of knowledge Motivation for treatment/growth Supportive family/friends   PATIENT IDENTIFIED PROBLEMS: Depression Suicidal thoughts Substance abuse "Maybe change some medications"                     DISCHARGE CRITERIA:  Ability to meet basic life and health needs Improved stabilization in mood, thinking, and/or behavior Reduction of life-threatening or endangering symptoms to within safe limits Verbal commitment to aftercare and medication compliance Withdrawal symptoms are absent or subacute and managed without 24-hour nursing intervention  PRELIMINARY DISCHARGE PLAN: Attend aftercare/continuing care group  PATIENT/FAMILY INVOLVEMENT: This treatment plan has been presented to and reviewed with the patient, William Gonzalez, and/or family member, .  The patient and family have been given the opportunity to ask questions and make suggestions.  Jasun Gasparini, [place], [place] 04/08/2019, 7:16 PM

## 2019-04-08 NOTE — ED Notes (Addendum)
Per Steffanie Rainwater at [facility], pt will be able go to Rm 302 via [facility] @445pm . We are to call [facility] to arrange, and fax his signed consent to [phone].  Pt can bring his walker.

## 2019-04-08 NOTE — Progress Notes (Signed)
[facility] NOVEL CORONAVIRUS (COVID-19) DAILY CHECK-OFF SYMPTOMS - answer yes or no to each - every day NO YES  Have you had a fever in the past 24 hours?  . Fever (Temp > 37.80C / 100F) X   Have you had any of these symptoms in the past 24 hours? . New Cough .  Sore Throat  .  Shortness of Breath .  Difficulty Breathing .  Unexplained Body Aches   X   Have you had any one of these symptoms in the past 24 hours not related to allergies?   . Runny Nose .  Nasal Congestion .  Sneezing   X   If you have had runny nose, nasal congestion, sneezing in the past 24 hours, has it worsened?  X   EXPOSURES - check yes or no X   Have you traveled outside the state in the past 14 days?  X   Have you been in contact with someone with a confirmed diagnosis of COVID-19 or PUI in the past 14 days without wearing appropriate PPE?  X   Have you been living in the same home as a person with confirmed diagnosis of COVID-19 or a PUI (household contact)?    X   Have you been diagnosed with COVID-19?    X              What to do next: Answered NO to all: Answered YES to anything:   Proceed with unit schedule Follow the [facility] Inpatient Flowsheet.   

## 2019-04-08 NOTE — ED Triage Notes (Signed)
Per EMS, Pt is coming from [facility]. Pt complaining of some SI thoughts. Some ETOH intoxication, pts last drink was 2100 yesterday.

## 2019-04-08 NOTE — ED Provider Notes (Signed)
[facility]-EMERGENCY DEPT Provider Note   CSN: [id number] Arrival date & time: 04/08/19  1142    History   Chief Complaint Chief Complaint  Patient presents with  . Suicidal    HPI William Gonzalez is a 63 y.o. male.  He has multiple medical problems including diabetes coronary disease hypertension and strokes.  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  [facility] and [facility]  They recommended he come here for medical clearance.  He denies any acute medical complaints including fevers shortness of breath chest pain or abdominal pain.  He said he has a chronic smoker's cough.     The history is provided by the patient.  Mental Health Problem  Presenting symptoms: depression and suicidal thoughts   Degree of incapacity (severity):  Unable to specify Onset quality:  Gradual Timing:  Intermittent Progression:  Worsening Context: alcohol use, drug abuse and stressful life event   Relieved by:  None tried Worsened by:  Nothing Ineffective treatments:  None tried Associated symptoms: no abdominal pain, no chest pain and no headaches     Past Medical History:  Diagnosis Date  . Alcohol abuse   . Coronary artery disease   . Diabetes mellitus   . Hypertension   . Stented coronary artery   . Tobacco abuse     Patient Active Problem List   Diagnosis Date Noted  . Anemia 04/18/2018  . Ataxic gait   . Cocaine abuse (HCC)   . Cerebellar infarct (HCC) 04/21/2015  . Tobacco abuse 04/21/2015  . Alcohol abuse 04/21/2015  . Stroke (HCC) 04/21/2015  . Diabetes mellitus without complication (HCC) 04/21/2015  . Coronary artery disease   . Hypertension     Past Surgical History:  Procedure Laterality Date  . CORONARY STENT PLACEMENT          Home Medications    Prior to Admission medications   Medication Sig Start Date End Date  Taking? Authorizing Provider  acetaminophen (TYLENOL) 500 MG tablet Take 1,000 mg by mouth daily as needed for mild pain.     [provider]  amLODipine (NORVASC) 10 MG tablet Take 1 tablet (10 mg total) by mouth daily. 04/22/18   Burnadette Pop, MD  atorvastatin (LIPITOR) 40 MG tablet Take 1 tablet (40 mg total) by mouth daily at 6 PM. 04/21/18   Burnadette Pop, MD  clopidogrel (PLAVIX) 75 MG tablet Take 1 tablet (75 mg total) by mouth daily. 04/22/18   Burnadette Pop, MD  glipiZIDE (GLUCOTROL) 10 MG tablet Take 10 mg by mouth 2 (two) times daily before a meal.    [provider]  hydrocortisone cream 1 % Apply 1 application topically daily as needed for itching (rash).     [provider]  latanoprost (XALATAN) 0.005 % ophthalmic solution Place 1 drop into both eyes at bedtime.    [provider]  Liniments (BLUE-EMU SUPER STRENGTH) CREA Apply 1 application topically daily as needed (shoulder or back pain).     [provider]  lisinopril (PRINIVIL,ZESTRIL) 20 MG tablet Take 1 tablet (20 mg total) by mouth daily. 04/21/18   Burnadette Pop, MD  metFORMIN (GLUCOPHAGE) 1000 MG tablet Take 1 tablet (1,000 mg total) by mouth 2 (two) times daily with a meal. 07/04/12   Molpus, John, MD  metoprolol tartrate (LOPRESSOR) 25 MG tablet Take 12.5 mg by mouth 2 (  two) times daily.    [provider]  omeprazole (PRILOSEC) 20 MG capsule Take 20 mg by mouth daily.    [provider]  sertraline (ZOLOFT) 100 MG tablet Take 1 tablet (100 mg total) by mouth 2 (two) times daily. Patient not taking: Reported on 04/21/2015 07/04/12   Molpus, Jonny Ruiz, MD    Family History Family History  Problem Relation Age of Onset  . Hypertension Mother   . Diabetes Mother   . Hypertension Father   . Diabetes Father   . Hypertension Sister   . Hypertension Brother   . Diabetes Sister   . Diabetes Brother     Social History Social History   Tobacco Use  .  Smoking status: Current Every Day Smoker    Packs/day: 0.30    Types: Cigarettes  . Smokeless tobacco: Never Used  Substance Use Topics  . Alcohol use: Yes    Alcohol/week: 1.0 standard drinks    Types: 1 Cans of beer per week    Comment: occassionally   . Drug use: Yes    Types: Cocaine     Allergies   Iodine   Review of Systems Review of Systems  Constitutional: Negative for fever.  HENT: Negative for sore throat.   Eyes: Negative for visual disturbance.  Respiratory: Positive for cough (chronic). Negative for shortness of breath.   Cardiovascular: Negative for chest pain.  Gastrointestinal: Negative for abdominal pain.  Genitourinary: Negative for dysuria.  Musculoskeletal: Negative for neck pain.  Skin: Negative for rash.  Neurological: Positive for weakness. Negative for headaches.  Psychiatric/Behavioral: Positive for suicidal ideas.     Physical Exam Updated Vital Signs BP (!) 148/75   Pulse 80   Resp 16   Ht 5\' 6"  (1.676 m)   Wt 72.6 kg   SpO2 100%   BMI 25.82 kg/m   Physical Exam Vitals signs and nursing note reviewed.  Constitutional:      Appearance: He is well-developed.  HENT:     Head: Normocephalic and atraumatic.  Eyes:     Conjunctiva/sclera: Conjunctivae normal.  Neck:     Musculoskeletal: Neck supple.  Cardiovascular:     Rate and Rhythm: Normal rate and regular rhythm.     Heart sounds: No murmur.  Pulmonary:     Effort: Pulmonary effort is normal. No respiratory distress.     Breath sounds: Normal breath sounds.  Abdominal:     Palpations: Abdomen is soft.     Tenderness: There is no abdominal tenderness.  Musculoskeletal: Normal range of motion.        General: No signs of injury.     Right lower leg: No edema.     Left lower leg: No edema.  Skin:    General: Skin is warm and dry.  Neurological:     Mental Status: He is alert and oriented to person, place, and time. Mental status is at baseline.  Psychiatric:        Mood  and Affect: Mood is depressed. Affect is tearful.      ED Treatments / Results  Labs (all labs ordered are listed, but only abnormal results are displayed) Labs Reviewed  COMPREHENSIVE METABOLIC PANEL - Abnormal; Notable for the following components:      Result Value   Sodium 134 (*)    CO2 21 (*)    Glucose, Bld 251 (*)    BUN 28 (*)    Creatinine, Ser 1.38 (*)    AST 12 (*)  GFR calc non Af Amer 53 (*)    All other components within normal limits  RAPID URINE DRUG SCREEN, HOSP PERFORMED - Abnormal; Notable for the following components:   Cocaine POSITIVE (*)    All other components within normal limits  CBC WITH DIFFERENTIAL/PLATELET - Abnormal; Notable for the following components:   WBC 13.8 (*)    RBC 3.90 (*)    Hemoglobin 12.8 (*)    HCT 38.9 (*)    Neutro Abs 10.5 (*)    All other components within normal limits  ETHANOL    EKG EKG Interpretation  Date/Time:  Wednesday April 08 2019 12:13:09 EDT Ventricular Rate:  75 PR Interval:    QRS Duration: 103 QT Interval:  375 QTC Calculation: 419 R Axis:   -81 Text Interpretation:  Sinus rhythm Left anterior fascicular block Probable left ventricular hypertrophy Anterior Q waves, possibly due to LVH Nonspecific T abnormalities, inferior leads Baseline wander in lead(s) I II aVR aVF V1 similar to prior 10/19 Confirmed by Meridee ScoreButler, Michael [phone]([id number]) on 04/08/2019 12:16:01 PM   Radiology No results found.  Procedures Procedures (including critical care time)  Medications Ordered in ED Medications  amLODipine (NORVASC) tablet 10 mg (10 mg Oral Given 04/08/19 1311)  atorvastatin (LIPITOR) tablet 40 mg (has no administration in time range)  clopidogrel (PLAVIX) tablet 75 mg (75 mg Oral Given 04/08/19 1447)  glipiZIDE (GLUCOTROL) tablet 10 mg (has no administration in time range)  latanoprost (XALATAN) 0.005 % ophthalmic solution 1 drop (has no administration in time range)  lisinopril (PRINIVIL,ZESTRIL) tablet 20 mg  (20 mg Oral Given 04/08/19 1312)  metFORMIN (GLUCOPHAGE) tablet 1,000 mg (has no administration in time range)  pantoprazole (PROTONIX) EC tablet 40 mg (40 mg Oral Given 04/08/19 1312)  acetaminophen (TYLENOL) tablet 650 mg (has no administration in time range)  ondansetron (ZOFRAN) tablet 4 mg (has no administration in time range)  alum & mag hydroxide-simeth (MAALOX/MYLANTA) 200-200-20 MG/5ML suspension 30 mL (has no administration in time range)  nicotine (NICODERM CQ - dosed in mg/24 hours) patch 21 mg (has no administration in time range)     Initial Impression / Assessment and Plan / ED Course  I have reviewed the triage vital signs and the nursing notes.  Pertinent labs & imaging results that were available during my care of the patient were reviewed by me and considered in my medical decision making (see chart for details).  Clinical Course as of Apr 07 1530  Wed Apr 08, 2019  1416 Patient here with depression and suicidal thoughts.  His medical work-up has been fairly unremarkable other than a slight bump in his creatinine.  He is tolerating p.o. here.  I have ordered his home medications.    [MB]    Clinical Course User Index [MB] Terrilee FilesButler, Michael C, MD       Final Clinical Impressions(s) / ED Diagnoses   Final diagnoses:  Suicidal thoughts  Depression, unspecified depression type    ED Discharge Orders    None       Terrilee FilesButler, Michael C, MD 04/08/19 1531

## 2019-04-08 NOTE — Progress Notes (Signed)
William Gonzalez is a 63 year old male pt admitted on voluntary basis. On admission, he reports that he has been feeling depressed, suicidal and also reports an alcohol problem. He reports that he drinks about a 6 pack a day and has been doing so for a long time. He reports that his last drink was yesterday. He does not appear to be in any acute withdrawal and reports that he is feeling ok. He reports that he gets his medications from the [place] in [place] and reports that he takes tham as prescribed. He reports that he was living with a cousin but reports that he does not want to go back there and reports that he will probably get a motel room or a room in a boarding house. William Gonzalez does use personal walker and reports that he cannot ambulate without it. William Gonzalez was cooperative during admission, was oriented to the unit and safety maintained.

## 2019-04-08 NOTE — BH Assessment (Addendum)
[facility] Assessment Progress Note  Per Jinny Blossom, FNP, this pt requires psychiatric hospitalization at this time.  Leonia Reader, RN, [facility] has assigned pt to [facility] Rm 302-1 with the following caveats: *Pt will need to come to [facility] with a walker *Pt will need to sign Voluntary Admission and Consent for Treatment [facility] will then be ready to receive pt at 16:45 At 15:42 this writer called [facility] and spoke to pt's nurse, Di Kindle, reporting these details to her.  At her request, I will fax a Consent form to her.  Once the terms above are met, pt is to be transported via [facility] with original document, and Di Kindle will call report to [phone].  Jalene Mullet, [place] [phone]   Addendum:  At 16:00 this writer faxed form to [facility].  Apolonio Schneiders, charge nurse, confirms receipt.  Jalene Mullet, [place] Coordinator [phone]

## 2019-04-08 NOTE — ED Notes (Signed)
Pts bank cards, and wallet were placed in a camo fanny pack. Fanny pack was placed in pt belongings bag.

## 2019-04-08 NOTE — ED Notes (Signed)
Bed: MH68 Expected date:  Expected time:  Means of arrival:  Comments: EMS med clearance

## 2019-04-08 NOTE — ED Notes (Signed)
Consent for Voluntary Commitment gather and being faxed.

## 2019-04-08 NOTE — BH Assessment (Addendum)
Tele Assessment Note   Patient Name: William Gonzalez MRN: [id number] Referring Physician: Meridee ScoreMichael Butler [facility] Location of Provider: Behavioral Health TTS Department  William Gonzalez is an 63 y.o. male who [facility] seeking help for his depression and alcohol problem.  Patient was struggling to walk, was on a blood thinner and his behavior was somewhat erratic.  Therefore, [facility].  Patient states that he has been severely depressed over the past week.  He states that he is depressed because he cannot to the things that he used to do.  He states that he can barely walk anymore.  He states that because of this that he has been feeling suicidal with a plan to OD on his medications.  Patient states that he attempted suicide last year and he was subsequently hospitalized at [facility]. Patient states that he is being [facility] for [facility] the [facility], but states that he is on medications that are not working and he states that he has not been taking them because he does not see that they are doing him any good.  Patient states that he has been experiencing angry mood states and indicates that he has thoughts of hurting others, but has no plan, intent or identified victim.  Patient states that he has no current psychosis.  He does indicated that he drinks daily and states that he drinks a pint of liquor daily along with ten beers and states that he has been drinking heavily since the age of 63.  He states that he also uses cocaine on occasion, but states that he only uses it once monthly. Patient states that his sleep varies, but states that he most often gets between 8 or 9 hours a day.  He states that his appetite is good with recent weight gain of 11 pounds.  Patient denies having any access to weapons.  He denies having a history of abuse and denies having history of self-mutilation.  Patient states that he is  rand states that he has two grown children ages 344 and [id number].  He states that he has eight grandchildren.  Patient states that he is disabled and states that he is currently living with his cousin.  Patient states that his entire family has substance abuse issues.  Patient states that he has six brothers and one sister.  Patient presented as alert and oriented.  His thoughts were organized, his memory intact.  He did not appear to be responding to any internal stimuli.  Patient's insight judgment and impulse control appear to be impaired.  He was depressed and tearful throughout his assessment process. Patient's speech was clear and his eye contact was good.  His psychotor activity was restless.    Diagnosis: F33.2 MDD Recurrent Severe, F10.20 Alcohol Use Disorder Severe  Past Medical History:  Past Medical History:  Diagnosis Date  . Alcohol abuse   . Coronary artery disease   . Diabetes mellitus   . Hypertension   . Stented coronary artery   . Tobacco abuse     Past Surgical History:  Procedure Laterality Date  . CORONARY STENT PLACEMENT      Family History:  Family History  Problem Relation Age of Onset  . Hypertension Mother   . Diabetes Mother   . Hypertension Father   . Diabetes Father   . Hypertension Sister   . Hypertension Brother   . Diabetes  Sister   . Diabetes Brother     Social History:  reports that he has been smoking cigarettes. He has been smoking about 0.30 packs per day. He has never used smokeless tobacco. He reports current alcohol use of about 1.0 standard drinks of alcohol per week. He reports current drug use. Drug: Cocaine.  Additional Social History:  Alcohol / Drug Use Pain Medications: See MAR Prescriptions: See MAR Over the Counter: See MAR History of alcohol / drug use?: Yes Longest period of sobriety (when/how long): unknown Negative Consequences of Use: Financial, Personal relationships Substance #1 Name of Substance 1: alcohol 1 - Age of  First Use: 15 1 - Amount (size/oz): pint and 10 beers 1 - Frequency: daily 1 - Duration: since onset 1 - Last Use / Amount: last use was yesterday Substance #2 Name of Substance 2: cocaine 2 - Age of First Use: unknown 2 - Amount (size/oz): naries 2 - Frequency: "every now & then" 2 - Duration: since onset 2 - Last Use / Amount: "the other day when I was angry"  CIWA: CIWA-Ar BP: (!) 151/90 Pulse Rate: 70 Nausea and Vomiting: no nausea and no vomiting Tactile Disturbances: none Tremor: no tremor Auditory Disturbances: not present Paroxysmal Sweats: no sweat visible Visual Disturbances: not present Anxiety: no anxiety, at ease Headache, Fullness in Head: none present Agitation: normal activity Orientation and Clouding of Sensorium: oriented and can do serial additions CIWA-Ar Total: 0 COWS:    Allergies:  Allergies  Allergen Reactions  . Iodine Anaphylaxis and Swelling    Swells all over    Home Medications: (Not in a hospital admission)   OB/GYN Status:  No LMP for male patient.  General Assessment Data TTS Assessment: In system Is this a Tele or Face-to-Face Assessment?: Tele Assessment Is this an Initial Assessment or a Re-assessment for this encounter?: Initial Assessment Patient Accompanied by:: N/A Language Other than English: No Living Arrangements: Other (Comment)(stays with his cousin) What gender do you identify as?: Male Marital status: Divorced Living Arrangements: Other relatives Can pt return to current living arrangement?: Yes Admission Status: Voluntary Is patient capable of signing voluntary admission?: Yes Referral Source: Self/Family/Friend Insurance type: medicare and medicaid     Crisis Care Plan Living Arrangements: Other relatives Legal Guardian: Other:(self) [facility] Psychiat[facility] Name of Therapist: [facility]  Education Status Is patient currently in school?: No Is the patient employed, unemployed or receiving  disability?: Receiving disability income  Risk to self with the past 6 months Suicidal Ideation: Yes-Currently Present Has patient been a risk to self within the past 6 months prior to admission? : No Suicidal Intent: Yes-Currently Present Has patient had any suicidal intent within the past 6 months prior to admission? : No Is patient at risk for suicide?: Yes Suicidal Plan?: Yes-Currently Present Has patient had any suicidal plan within the past 6 months prior to admission? : No Specify Current Suicidal Plan: (overdose on Rx medications) Access to Means: Yes Specify Access to Suicidal Means: pills What has been your use of drugs/alcohol within the last 12 months?: daily use alcohol Previous Attempts/Gestures: Yes How many times?: 1(overdose) Other Self Harm Risks: (minimal support) Triggers for Past Attempts: None known Intentional Self Injurious Behavior: None Family Suicide History: No Recent stressful life event(s): Other (Comment)(health issues) Persecutory voices/beliefs?: No Depression: Yes Depression Symptoms: Despondent, Tearfulness, Isolating, Loss of interest in usual pleasures, Feeling worthless/self pity Substance abuse history and/or treatment for substance abuse?: Yes Suicide prevention information  given to non-admitted patients: Not applicable  Risk to Others within the past 6 months Homicidal Ideation: No Does patient have any lifetime risk of violence toward others beyond the six months prior to admission? : No Thoughts of Harm to Others: No Current Homicidal Intent: No Current Homicidal Plan: No Access to Homicidal Means: No Identified Victim: none History of harm to others?: No Assessment of Violence: None Noted Violent Behavior Description: none Does patient have access to weapons?: No Criminal Charges Pending?: No Does patient have a court date: No Is patient on probation?: No  Psychosis Hallucinations: None noted Delusions: None noted  Mental  Status Report Appearance/Hygiene: Unremarkable Eye Contact: Good Motor Activity: Freedom of movement Speech: Logical/coherent Level of Consciousness: Alert, Restless Mood: Depressed, Anxious Affect: Anxious, Depressed Anxiety Level: Moderate Thought Processes: Coherent, Relevant Judgement: Impaired Orientation: Person, Place, Time, Situation Obsessive Compulsive Thoughts/Behaviors: None  Cognitive Functioning Concentration: Decreased Memory: Recent Intact, Remote Intact Is patient IDD: No Insight: Good Impulse Control: Poor Appetite: Good Have you had any weight changes? : No Change Sleep: No Change Total Hours of Sleep: 8 Vegetative Symptoms: None  ADLScreening [facility]) Patient's cognitive ability adequate to safely complete daily activities?: Yes Patient able to express need for assistance with ADLs?: Yes Independently performs ADLs?: Yes (appropriate for developmental age)  Prior Inpatient Therapy Prior Inpatient Therapy: Yes Prior Therapy Dates: 2019 Prior Therapy Facilty/Provider(s): [facility] Reason for Treatment: depression/ETOH  Prior Outpatient Therapy Prior Outpatient Therapy: Yes Prior Therapy Dates: active [facility] Reason for Treatment: depression Does patient have an ACCT team?: No Does patient have Intensive In-House Services?  : No Does patient have [facility] services? : No Does patient have P4CC services?: No  ADL Screening (condition at time of admission) Patient's cognitive ability adequate to safely complete daily activities?: Yes Is the patient deaf or have difficulty hearing?: No Does the patient have difficulty seeing, even when wearing glasses/contacts?: No Does the patient have difficulty concentrating, remembering, or making decisions?: No Patient able to express need for assistance with ADLs?: Yes Does the patient have difficulty dressing or bathing?: No Independently performs ADLs?:  Yes (appropriate for developmental age) Does the patient have difficulty walking or climbing stairs?: Yes Weakness of Legs: Both Weakness of Arms/Hands: None  Home Assistive Devices/Equipment Home Assistive Devices/Equipment: None  Therapy Consults (therapy consults require a physician order) PT Evaluation Needed: No OT Evalulation Needed: No SLP Evaluation Needed: No Abuse/Neglect Assessment (Assessment to be complete while patient is alone) Abuse/Neglect Assessment Can Be Completed: Yes Physical Abuse: Denies Verbal Abuse: Denies Sexual Abuse: Denies Self-Neglect: Denies Values / Beliefs Spiritual Requests During Hospitalization: None Consults Spiritual Care Consult Needed: No Advance Directives (For Healthcare) Does Patient Have a Medical Advance Directive?: No Does patient want to make changes to medical advance directive?: No - Patient declined Type of Advance Directive: Healthcare Power of Attorney Copy of Healthcare Power of Attorney in Chart?: No - copy requested Would patient like information on creating a medical advance directive?: No - Patient declined Nutrition Screen- [facility] Adult/WL/AP Has the patient recently lost weight without trying?: No Has the patient been eating poorly because of a decreased appetite?: No Malnutrition Screening Tool Score: 0        Disposition: Per Elta Guadeloupe, NP, Inpatient Treatment is reommended Disposition Initial Assessment Completed for this Encounter: Yes  This service was provided via telemedicine using a 2-way, interactive audio and video technology.  Names of all persons participating in this telemedicine  service and their role in this encounter. Name: William Gonzalez Role: patient  Name: Dannielle Huh Carlie Corpus Role: TTS  Name:  Role:   Name:  Role:     Daphene Calamity 04/08/2019 2:52 PM

## 2019-04-08 NOTE — Progress Notes (Addendum)
William Gonzalez had just arrived on unit. Pt was oriented to unit rules/procedures. Pt endorses passive SI but was able to verbal contract for safety.Pt denies HI/AVH/Pain at this time. Pt was given snacks/fluids and proceeded to go to bed. High falls risk ordered and maintained due to unsteady gait; Pt currently ambulating with walker. Support offered. Will continue with POC.

## 2019-04-09 DIAGNOSIS — F332 Major depressive disorder, recurrent severe without psychotic features: Principal | ICD-10-CM

## 2019-04-09 MED ORDER — CHLORDIAZEPOXIDE HCL 25 MG PO CAPS
25.0000 mg | ORAL_CAPSULE | Freq: Three times a day (TID) | ORAL | Status: AC
  Administered 2019-04-10 (×2): 25 mg via ORAL
  Filled 2019-04-09 (×2): qty 1

## 2019-04-09 MED ORDER — AMANTADINE HCL 100 MG PO CAPS
100.0000 mg | ORAL_CAPSULE | Freq: Two times a day (BID) | ORAL | Status: DC
  Administered 2019-04-09 – 2019-04-13 (×9): 100 mg via ORAL
  Filled 2019-04-09 (×15): qty 1

## 2019-04-09 MED ORDER — VORTIOXETINE HBR 10 MG PO TABS
10.0000 mg | ORAL_TABLET | Freq: Every day | ORAL | Status: DC
  Administered 2019-04-09 – 2019-04-13 (×5): 10 mg via ORAL
  Filled 2019-04-09 (×7): qty 1

## 2019-04-09 MED ORDER — CHLORDIAZEPOXIDE HCL 25 MG PO CAPS: 25.0000 mg | ORAL_CAPSULE | Freq: Four times a day (QID) | ORAL | Status: AC | PRN

## 2019-04-09 MED ORDER — CHLORDIAZEPOXIDE HCL 25 MG PO CAPS
25.0000 mg | ORAL_CAPSULE | Freq: Four times a day (QID) | ORAL | Status: AC
  Administered 2019-04-09 (×2): 25 mg via ORAL
  Filled 2019-04-09 (×4): qty 1

## 2019-04-09 MED ORDER — CHLORDIAZEPOXIDE HCL 25 MG PO CAPS
25.0000 mg | ORAL_CAPSULE | ORAL | Status: AC
  Administered 2019-04-11: 25 mg via ORAL
  Filled 2019-04-09: qty 1

## 2019-04-09 MED ORDER — CHLORDIAZEPOXIDE HCL 25 MG PO CAPS
25.0000 mg | ORAL_CAPSULE | Freq: Every day | ORAL | Status: AC
  Administered 2019-04-13: 25 mg via ORAL
  Filled 2019-04-09: qty 1

## 2019-04-09 MED ORDER — METOPROLOL SUCCINATE ER 50 MG PO TB24
50.0000 mg | ORAL_TABLET | Freq: Every day | ORAL | Status: DC
  Administered 2019-04-09 – 2019-04-13 (×5): 50 mg via ORAL
  Filled 2019-04-09 (×9): qty 1

## 2019-04-09 NOTE — BHH Counselor (Signed)
Adult Comprehensive Assessment  Patient ID: William Gonzalez, male   DOB: Jan 11, 1956, 63 y.o.   MRN: [id number]  Information Source: Information source: Patient  Current Stressors:  Patient states their primary concerns and needs for treatment are:: [facility] due to SI and wanting to get help with drinking. [facility], but [facility]. Patient states their goals for this hospitilization and ongoing recovery are:: [facility] to [facility] straight [facility] "I want some long term treatment. I need some help, more than 1-2 days." Educational / Learning stressors: Denies Employment / Job issues: Denies, retired/disabled Family Relationships: They live nearby and are somewhat supportive, but patient doesn't want anyone to know he is here. Financial / Lack of resources (include bankruptcy): Medicare, Medicaid, disability and retirement income Housing / Lack of housing: Was staying with a cousin but does not want to go back there. 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 Diabetes, heart condition, struggles with mobility. Social relationships: Says he has friends but he thinks they take advantage of him. Substance abuse: "I've tried everything, but my problem is the alcohol." Reports drinking daily, typically a pint of liqour and 10 cans of beer. Bereavement / Loss: Parents deceased, some of his siblings  Living/Environment/Situation:  Living Arrangements: Other relatives Living conditions (as described by patient or guardian): Was living with a cousin in [place] for a couple of months, says he does not intend to return there. Who else lives in the home?: Cousin How long has patient lived in current situation?: A couple of months What is atmosphere in current home: Temporary  Family History:  Marital status: Single Are you sexually active?: No What is your sexual orientation?:  Straight Has your sexual activity been affected by drugs, alcohol, medication, or emotional stress?: Denies Does patient have children?: Yes How many children?: 2 How is patient's relationship with their children?: Has two adult children in [place], reports good relationships with them but he doesn't want them to know he is here.  Childhood History:  By whom was/is the patient raised?: Both parents Additional childhood history information: none Description of patient's relationship with caregiver when they were a child: "It was perfect." Patient's description of current relationship with people who raised him/her: Both are deceased. How were you disciplined when you got in trouble as a child/adolescent?: Appropriate discipline Does patient have siblings?: Yes Number of Siblings: 7 Description of patient's current relationship with siblings: 6 brothers and 1 sister, has good relationships with his siblings that are still living. Did patient suffer any verbal/emotional/physical/sexual abuse as a child?: No Did patient suffer from severe childhood neglect?: No Has patient ever been sexually abused/assaulted/raped as an adolescent or adult?: No Was the patient ever a victim of a crime or a disaster?: No Witnessed domestic violence?: No Has patient been effected by domestic violence as an adult?: No  Education:  Highest grade of school patient has completed: Scientist, research (physical sciences)Associates Degree in business Currently a student?: No Learning disability?: No  Employment/Work Situation:   Employment situation: Retired Why is patient on disability: PTSD Therapist, nutritional- [employer] How long has patient been on disability: years Patient's job has been impacted by current illness: No What is the longest time patient has a held a job?: 30 years 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 Did You Receive Any Psychiatric Treatment/Services While in [employer]?: Yes Type of Psychiatric  Treatment/Services in  Military: inpatient, outpatient tx Are There Guns or Other Weapons in Your Home?: No  Financial Resources:   Financial resources: [employer], [place], Medicare Does patient have a Lawyer or guardian?: No  Alcohol/Substance Abuse:   What has been your use of drugs/alcohol within the last 12 months?: Daily ETOH, 10 beers and 1 pint of liquor Alcohol/Substance Abuse Treatment Hx: Past Tx, Inpatient, Past Tx, Outpatient, Past detox If yes, describe treatment: [facility] in 2019, [facility] for outpatient, [facility] in [facility] Has alcohol/substance abuse ever caused legal problems?: No  Social Support System:   Conservation officer, nature Support System: Fair Museum/gallery exhibitions officer System: Family, friends Type of faith/religion: Ephriam Knuckles How does patient's faith help to cope with current illness?: unsure  Leisure/Recreation:   Leisure and Hobbies: Watching sports  Strengths/Needs:   What is the patient's perception of their strengths?: "I'm good with people." Patient states they can use these personal strengths during their treatment to contribute to their recovery: unsure Patient states these barriers may affect/interfere with their treatment: Wants to go to [facility].  Discharge Plan:   Currently receiving community mental health services: Yes (From Whom) Patient states concerns and preferences for aftercare planning are: Only receptive to going to [facility]. He is established with the [facility] for primary care and psychiatry. Patient states they will know when they are safe and ready for discharge when: Won't feel safe until he can go somewhere long term Does patient have access to transportation?: No Does patient have financial barriers related to discharge medications?: No Patient description of barriers related to discharge medications: Medicare, Medicaid Plan for no access to transportation at discharge: Bus pass/taxi Will patient be  returning to same living situation after discharge?: (Unsure.)  Summary/Recommendations:   Summary and Recommendations (to be completed by the evaluator): Alvi is a 63 year old male from [facility] [place]) he presents to [facility] voluntarily from [place]. Patient was taken to [facility] by [facility] after calling a veteran's crisis line. Patient is seeking treatment for depression, SI, and ETOH abuse. He reports drinking one pint of liquor and 10 beers daily. Patient is diagnosed with MDD and PTSD. He cites his physical health has a priamry stressor. Patient is focused solely on transferring to the [facility]. for inpatient treatment, he is established with the [facility] for outpatient care. Patient will benefit from crisis stabilization, medication managment, therapeutic milieu, and referral for services.   Darreld Mclean. 04/09/2019

## 2019-04-09 NOTE — Progress Notes (Signed)
William Gonzalez did not attend wrap-up group despite invitation to attend. Pt endorses passive SI but was able to verbal contract for safety.Pt denies HI/AVH/Pain at this time. Pt appears drowsy this evening. Pt stays in bed for majority of shift.Snacks and fluids offered; Pt declined.High falls risk ordered and maintained due to unsteady gait; Pt ambulates with walker. Support offered. Will continue with POC.

## 2019-04-09 NOTE — BHH Group Notes (Signed)
LCSW Group Therapy Notes 04/09/2019 3:20 PM  Type of Therapy and Topic: Group Therapy: Overcoming Obstacles  Participation Level: Did Not Attend  Description of Group:  In this group patients will be encouraged to explore what they see as obstacles to their own wellness and recovery. They will be guided to discuss their thoughts, feelings, and behaviors related to these obstacles. The group will process together ways to cope with barriers, with attention given to specific choices patients can make. Each patient will be challenged to identify changes they are motivated to make in order to overcome their obstacles. This group will be process-oriented, with patients participating in exploration of their own experiences as well as giving and receiving support and challenge from other group members.  Therapeutic Goals: 1. Patient will identify personal and current obstacles as they relate to admission. 2. Patient will identify barriers that currently interfere with their wellness or overcoming obstacles.  3. Patient will identify feelings, thought process and behaviors related to these barriers. 4. Patient will identify two changes they are willing to make to overcome these obstacles:   Summary of Patient Progress   Therapeutic Modalities:  Cognitive Behavioral Therapy Solution Focused Therapy Motivational Interviewing Relapse Prevention Therapy  Klare Criss, MSW, LCSWA 04/09/2019 3:20 PM   

## 2019-04-09 NOTE — Plan of Care (Signed)
  Problem: Activity: Goal: Interest or engagement in activities will improve Outcome: Not Progressing   D: Pt alert and oriented on the unit. Pt engaging other pts as he sat in the day room. Pt denies SI/HI, A/VH. Pt did not attend unit groups and activities. Pt is cooperative. A: Education, support and encouragement provided, q15 minute safety checks remain in effect. Medications administered per MD orders. R: No reactions/side effects to medicine noted. Pt denies any concerns at this time, and verbally contracts for safety. Pt ambulating on the unit with no issues. Pt remains safe on the unit.

## 2019-04-09 NOTE — Progress Notes (Signed)
[place] NOVEL CORONAVIRUS (COVID-19) DAILY CHECK-OFF SYMPTOMS - answer yes or no to each - every day NO YES  Have you had a fever in the past 24 hours?  . Fever (Temp > 37.80C / 100F) X   Have you had any of these symptoms in the past 24 hours? . New Cough .  Sore Throat  .  Shortness of Breath .  Difficulty Breathing .  Unexplained Body Aches   X   Have you had any one of these symptoms in the past 24 hours not related to allergies?   . Runny Nose .  Nasal Congestion .  Sneezing   X   If you have had runny nose, nasal congestion, sneezing in the past 24 hours, has it worsened?  X   EXPOSURES - check yes or no X   Have you traveled outside the state in the past 14 days?  X   Have you been in contact with someone with a confirmed diagnosis of COVID-19 or PUI in the past 14 days without wearing appropriate PPE?  X   Have you been living in the same home as a person with confirmed diagnosis of COVID-19 or a PUI (household contact)?    X   Have you been diagnosed with COVID-19?    X              What to do next: Answered NO to all: Answered YES to anything:   Proceed with unit schedule Follow the [facility] Inpatient Flowsheet.   

## 2019-04-09 NOTE — Progress Notes (Signed)
Pt slept most of the evening. Pt declined snack and wrap up group this evening.

## 2019-04-09 NOTE — Progress Notes (Signed)
Patient ID: William Gonzalez, male   DOB: September 06, 1956, 63 y.o.   MRN: [id number]  [place] NOVEL CORONAVIRUS (COVID-19) DAILY CHECK-OFF SYMPTOMS - answer yes or no to each - every day NO YES  Have you had a fever in the past 24 hours?  . Fever (Temp > 37.80C / 100F) X   Have you had any of these symptoms in the past 24 hours? . New Cough .  Sore Throat  .  Shortness of Breath .  Difficulty Breathing .  Unexplained Body Aches   X   Have you had any one of these symptoms in the past 24 hours not related to allergies?   . Runny Nose .  Nasal Congestion .  Sneezing   X   If you have had runny nose, nasal congestion, sneezing in the past 24 hours, has it worsened?  X   EXPOSURES - check yes or no X   Have you traveled outside the state in the past 14 days?  X   Have you been in contact with someone with a confirmed diagnosis of COVID-19 or PUI in the past 14 days without wearing appropriate PPE?  X   Have you been living in the same home as a person with confirmed diagnosis of COVID-19 or a PUI (household contact)?    X   Have you been diagnosed with COVID-19?    X              What to do next: Answered NO to all: Answered YES to anything:   Proceed with unit schedule Follow the [facility] Inpatient Flowsheet.

## 2019-04-09 NOTE — H&P (Signed)
Psychiatric Admission Assessment Adult  Patient Identification: William Gonzalez MRN:  [id number] Date of Evaluation:  04/09/2019 Chief ComJAIVIAN Gonzalez use disorder Principal Diagnosis: Several complaints suicidal thoughts, homicidal thoughts without plans, alcohol dependence, and depressive symptoms Diagnosis:  Active Problems:   MDD (major depressive disorder), recurrent severe, without psychosis (HCC)  History of Present Illness:  William Gonzalez is 63 years of age [facility] who has medical comorbidities of hypertension, hyperlipidemia, diabetes type 2, coronary artery disease -status post stent, and 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  Drug screen also reflected cocaine abuse He reported a recent fall at home and has taken blood thinners therefore he was medically screened and deemed medically stable prior to transfer here.  He elaborated that he did overdose on medications approximately 1 year ago and was treated at the [facility] hospital in the [facility] area and he has been seen at the [facility] outpatient center but does not believe his antidepressants or "what ever" they were giving him or particularly helpful and is noncompliant fully. Stated his cocaine use is only about once a month, sleep is variable appetite fair and he has no access to weapons. He certainly cordial on my exam  Alert oriented to person place time day and situation appropriate eye contact at intervals denies wanting to harm self at this point and can contract for safety while here denies wanting to harm others states he is staying with a family member but wants to get out of there because of the drinking and drug and going on there so there clearly some issues of secondary gain going  on Associated Signs/Symptoms: Depression Symptoms:  insomnia, (Hypo) Manic Symptoms:  n/a Anxiety Symptoms:  Excessive Worry, Psychotic Symptoms:  n/a PTSD Symptoms: NA Total Time spent with patient: 45 minutes  Is the patient at risk to self? Yes.    Has the patient been a risk to self in the past 6 months? No.  Has the patient been a risk to self within the distant past? Yes.    Is the patient a risk to others? No.  Has the patient been a risk to others in the past 6 months? No.  Has the patient been a risk to others within the distant past? No.   Alcohol Screening: 1. How often do you have a drink containing alcohol?: 4 or more times a week 2. How many drinks containing alcohol do you have on a typical day when you are drinking?: 5 or 6 3. How often do you have six or more drinks on one occasion?: Less than monthly AUDIT-C Score: 7 4. How often during the last year have you found that you were not able to stop drinking once you had started?: Never 5. How often during the last year have you failed to do what was normally expected from you becasue of drinking?: Monthly 6. How often during the last year have you needed a first drink in the morning to get yourself going after a heavy drinking session?: Never 7. How often during the last year have you had a feeling of guilt of remorse after drinking?: Never 8. How often during the last year have you been unable to remember what happened the night before because you had been drinking?:  Never 9. Have you or someone else been injured as a result of your drinking?: No 10. Has a relative or friend or a doctor or another health worker been concerned about your drinking or suggested you cut down?: Yes, during the last year Alcohol Use Disorder Identification Test Final Score (AUDIT): 13 Alcohol Brief Interventions/Follow-up: Alcohol Education Substance Abuse History in the last 12 months:  Yes.   Consequences of Substance Abuse: NA Previous  Psychotropic Medications: Yes  Psychological Evaluations: No  Past Medical History:  Past Medical History:  Diagnosis Date  . Alcohol abuse   . Coronary artery disease   . Diabetes mellitus   . Hypertension   . Stented coronary artery   . Tobacco abuse     Past Surgical History:  Procedure Laterality Date  . CORONARY STENT PLACEMENT     Family History:  Family History  Problem Relation Age of Onset  . Hypertension Mother   . Diabetes Mother   . Hypertension Father   . Diabetes Father   . Hypertension Sister   . Hypertension Brother   . Diabetes Sister   . Diabetes Brother     Tobacco Screening: Have you used any form of tobacco in the last 30 days? (Cigarettes, Smokeless Tobacco, Cigars, and/or Pipes): Yes Tobacco use, Select all that apply: 5 or more cigarettes per day Are you interested in Tobacco Cessation Medications?: Yes, will notify MD for an order Counseled patient on smoking cessation including recognizing danger situations, developing coping skills and basic information about quitting provided: Refused/Declined practical counseling Social History:  Social History   Substance and Sexual Activity  Alcohol Use Yes  . Alcohol/week: 1.0 standard drinks  . Types: 1 Cans of beer per week   Comment: occassionally      Social History   Substance and Sexual Activity  Drug Use Yes  . Types: Cocaine    Additional Social History:                           Allergies:   Allergies  Allergen Reactions  . Iodine Anaphylaxis and Swelling    Swells all over   Lab Results:  Results for orders placed or performed during the hospital encounter of 04/08/19 (from the past 48 hour(s))  Comprehensive metabolic panel     Status: Abnormal   Collection Time: 04/08/19 12:00 PM  Result Value Ref Range   Sodium 134 (L) 135 - 145 mmol/L   Potassium 4.5 3.5 - 5.1 mmol/L   Chloride 104 98 - 111 mmol/L   CO2 21 (L) 22 - 32 mmol/L   Glucose, Bld 251 (H) 70 - 99 mg/dL    BUN 28 (H) 8 - 23 mg/dL   Creatinine, Ser 1.61 (H) 0.61 - 1.24 mg/dL   Calcium 9.4 8.9 - 09.6 mg/dL   Total Protein 7.3 6.5 - 8.1 g/dL   Albumin 4.2 3.5 - 5.0 g/dL   AST 12 (L) 15 - 41 U/L   ALT 14 0 - 44 U/L   Alkaline Phosphatase 110 38 - 126 U/L   Total Bilirubin 0.3 0.3 - 1.2 mg/dL   GFR calc non Af Amer 53 (L) >60 mL/min   GFR calc Af Amer >60 >60 mL/min   Anion gap 9 5 - 15    Comment: Performed at [facility], [address], [place], [place] [address]  Ethanol     Status: None   Collection Time: 04/08/19 12:00  PM  Result Value Ref Range   Alcohol, Ethyl (B) <10 <10 mg/dL    Comment: (NOTE) Lowest detectable limit for serum alcohol is 10 mg/dL. For medical purposes only. Performed at [facility], [address], [place], [place] [address]   CBC with Diff     Status: Abnormal   Collection Time: 04/08/19 12:00 PM  Result Value Ref Range   WBC 13.8 (H) 4.0 - 10.5 K/uL   RBC 3.90 (L) 4.22 - 5.81 MIL/uL   Hemoglobin 12.8 (L) 13.0 - 17.0 g/dL   HCT 60.4 (L) 54.0 - 98.1 %   MCV 99.7 80.0 - 100.0 fL   MCH 32.8 26.0 - 34.0 pg   MCHC 32.9 30.0 - 36.0 g/dL   RDW 19.1 47.8 - 29.5 %   Platelets 324 150 - 400 K/uL   nRBC 0.0 0.0 - 0.2 %   Neutrophils Relative % 77 %   Neutro Abs 10.5 (H) 1.7 - 7.7 K/uL   Lymphocytes Relative 15 %   Lymphs Abs 2.0 0.7 - 4.0 K/uL   Monocytes Relative 6 %   Monocytes Absolute 0.8 0.1 - 1.0 K/uL   Eosinophils Relative 2 %   Eosinophils Absolute 0.3 0.0 - 0.5 K/uL   Basophils Relative 0 %   Basophils Absolute 0.0 0.0 - 0.1 K/uL   Immature Granulocytes 0 %   Abs Immature Granulocytes 0.05 0.00 - 0.07 K/uL    Comment: Performed at [facility], [address] [place], [place] [address]  Urine rapid drug screen (hosp performed)     Status: Abnormal   Collection Time: 04/08/19 12:58 PM  Result Value Ref Range   Opiates NONE DETECTED NONE DETECTED   Cocaine POSITIVE (A) NONE DETECTED    Benzodiazepines NONE DETECTED NONE DETECTED   Amphetamines NONE DETECTED NONE DETECTED   Tetrahydrocannabinol NONE DETECTED NONE DETECTED   Barbiturates NONE DETECTED NONE DETECTED    Comment: (NOTE) DRUG SCREEN FOR MEDICAL PURPOSES ONLY.  IF CONFIRMATION IS NEEDED FOR ANY PURPOSE, NOTIFY LAB WITHIN 5 DAYS. LOWEST DETECTABLE LIMITS FOR URINE DRUG SCREEN Drug Class                     Cutoff (ng/mL) Amphetamine and metabolites    1000 Barbiturate and metabolites    200 Benzodiazepine                 200 Tricyclics and metabolites     300 Opiates and metabolites        300 Cocaine and metabolites        300 THC                            50 Performed at [facility], [address], [place], [place] [address]     Blood Alcohol level:  Lab Results  Component Value Date   [facility] <10 04/08/2019   ETH <10 10/09/2018    Metabolic Disorder Labs:  Lab Results  Component Value Date   HGBA1C 7.1 (A) 09/09/2018   HGBA1C 7.1 09/09/2018   HGBA1C 7.1 (A) 09/09/2018   HGBA1C 7.1 (A) 09/09/2018   MPG 157.07 04/19/2018   MPG 163 04/22/2015   No results found for: PROLACTIN Lab Results  Component Value Date   CHOL 137 04/19/2018   TRIG 79 04/19/2018   HDL 50 04/19/2018   CHOLHDL 2.7 04/19/2018   VLDL 16 04/19/2018   LDLCALC  71 04/19/2018   LDLCALC 88 04/22/2015    Current Medications: Current Facility-Administered Medications  Medication Dose Route Frequency Provider Last Rate Last Dose  . acetaminophen (TYLENOL) tablet 650 mg  650 mg Oral Q4H PRN Laveda AbbeParks, Laurie Britton, NP      . alum & mag hydroxide-simeth (MAALOX/MYLANTA) 200-200-20 MG/5ML suspension 30 mL  30 mL Oral Q6H PRN Laveda AbbeParks, Laurie Britton, NP      . amantadine (SYMMETREL) capsule 100 mg  100 mg Oral BID Malvin JohnsFarah, Rutger Salton, MD      . amLODipine (NORVASC) tablet 10 mg  10 mg Oral Daily Laveda AbbeParks, Laurie Britton, NP   10 mg at 04/09/19 0647  . atorvastatin (LIPITOR) tablet 40 mg  40 mg Oral q1800 Laveda AbbeParks,  Laurie Britton, NP      . clopidogrel (PLAVIX) tablet 75 mg  75 mg Oral Daily Laveda AbbeParks, Laurie Britton, NP   75 mg at 04/09/19 [id number]  . glipiZIDE (GLUCOTROL) tablet 10 mg  10 mg Oral BID AC Laveda AbbeParks, Laurie Britton, NP   10 mg at 04/09/19 [id number]  . latanoprost (XALATAN) 0.005 % ophthalmic solution 1 drop  1 drop Both Eyes QHS Laveda AbbeParks, Laurie Britton, NP      . lisinopril (PRINIVIL,ZESTRIL) tablet 20 mg  20 mg Oral Daily Laveda AbbeParks, Laurie Britton, NP   20 mg at 04/09/19 [phone]  . magnesium hydroxide (MILK OF MAGNESIA) suspension 30 mL  30 mL Oral Daily PRN Laveda AbbeParks, Laurie Britton, NP      . metFORMIN (GLUCOPHAGE) tablet 1,000 mg  1,000 mg Oral BID WC Laveda AbbeParks, Laurie Britton, NP   1,000 mg at 04/09/19 0810  . nicotine (NICODERM CQ - dosed in mg/24 hours) patch 21 mg  21 mg Transdermal Daily Laveda AbbeParks, Laurie Britton, NP   21 mg at 04/09/19 [id number]  . ondansetron (ZOFRAN) tablet 4 mg  4 mg Oral Q8H PRN Laveda AbbeParks, Laurie Britton, NP      . pantoprazole (PROTONIX) EC tablet 40 mg  40 mg Oral Daily Laveda AbbeParks, Laurie Britton, NP   40 mg at 04/09/19 0810  . vortioxetine HBr (TRINTELLIX) tablet 10 mg  10 mg Oral Daily Malvin JohnsFarah, Ikenna Ohms, MD       PTA Medications: Medications Prior to Admission  Medication Sig Dispense Refill Last Dose  . acetaminophen (TYLENOL) 500 MG tablet Take 1,000 mg by mouth daily as needed for mild pain.    Past Month at Unknown time  . amLODipine (NORVASC) 10 MG tablet Take 1 tablet (10 mg total) by mouth daily. 30 tablet 0 04/06/2019 at Unknown time  . atorvastatin (LIPITOR) 40 MG tablet Take 1 tablet (40 mg total) by mouth daily at 6 PM. 30 tablet 0 04/06/2019 at Unknown time  . clopidogrel (PLAVIX) 75 MG tablet Take 1 tablet (75 mg total) by mouth daily. 30 tablet 0 04/06/2019 at 9am  . Ferrous Sulfate (IRON PO) Take 1 tablet by mouth as directed. Monday, Wednesday, Friday   04/06/2019  . glipiZIDE (GLUCOTROL) 10 MG tablet Take 10 mg by mouth 2 (two) times daily before a meal.   04/06/2019  . hydrocortisone cream 1 % Apply  1 application topically daily as needed for itching (rash).    04/07/2019 at Unknown time  . ibuprofen (ADVIL,MOTRIN) 200 MG tablet Take 400 mg by mouth every 6 (six) hours as needed for headache or moderate pain.   04/06/2019  . latanoprost (XALATAN) 0.005 % ophthalmic solution Place 1 drop into both eyes at bedtime.   04/06/2019  . Liniments (BLUE-EMU SUPER STRENGTH) CREA  Apply 1 application topically daily as needed (shoulder or back pain).    04/07/2019 at Unknown time  . lisinopril (PRINIVIL,ZESTRIL) 20 MG tablet Take 1 tablet (20 mg total) by mouth daily. 30 tablet 0 04/06/2019 at Unknown time  . metFORMIN (GLUCOPHAGE) 1000 MG tablet Take 1 tablet (1,000 mg total) by mouth 2 (two) times daily with a meal. 60 tablet 0 04/06/2019 at Unknown time  . omeprazole (PRILOSEC) 20 MG capsule Take 20 mg by mouth daily.   04/06/2019  . sertraline (ZOLOFT) 100 MG tablet Take 1 tablet (100 mg total) by mouth 2 (two) times daily. (Patient not taking: Reported on 04/21/2015) 60 tablet 0 Not Taking at Unknown time    Musculoskeletal: Strength & Muscle Tone: decreased Gait & Station: broad based Patient leans: Front  Psychiatric Specialty Exam: Physical Exam  ROS  Blood pressure (!) 152/81, pulse 74, temperature 98.1 F (36.7 C), temperature source Oral, resp. rate 18, height 5\' 6"  (1.676 m), weight 72.6 kg.Body mass index is 25.82 kg/m.  General Appearance: Disheveled  Eye Contact:  Fair  Speech:  Normal Rate  Volume:  Decreased  Mood:  Dysphoric  Affect:  Congruent  Thought Process:  Coherent  Orientation:  Full (Time, Place, and Person)  Thought Content:  Tangential  Suicidal Thoughts:  Yes.  without intent/plan  Homicidal Thoughts:  No  Memory:  Immediate;   Fair  Judgement:  Good  Insight:  Fair  Psychomotor Activity:  Decreased  Concentration:  Concentration: Good  Recall:  Good  Fund of Knowledge:  Good  Language:  nl  Akathisia:  Negative  Handed:  Right  AIMS (if indicated):      Assets:  Communication Skills Desire for Improvement  ADL's:  Intact  Cognition:  WNL  Sleep:  Number of Hours: 5.25    Treatment Plan Summary: Daily contact with patient to assess and evaluate symptoms and progress in treatment and Medication management  Observation Level/Precautions:  15 minute checks  Laboratory:  UDS  Psychotherapy: Cognitive and rehab  Medications: Begin antidepressant  Consultations: None necessary  Discharge Concerns: Housing long-term stability  Estimated LOS: 5 days  Other: Axis I alcohol dependence cocaine abuse, post stroke depression +/- exacerbation underlying chronic depression, issues of secondary gain   Physician Treatment Plan for Primary Diagnosis: <principal problem not specified> Long Term Goal(s): Improvement in symptoms so as ready for discharge  Short Term Goals: Ability to disclose and discuss suicidal ideas  Physician Treatment Plan for Secondary Diagnosis: Active Problems:   MDD (major depressive disorder), recurrent severe, without psychosis (HCC)  Long Term Goal(s): Improvement in symptoms so as ready for discharge  Short Term Goals: Ability to identify triggers associated with substance abuse/mental health issues will improve  I certify that inpatient services furnished can reasonably be expected to improve the patient's condition.    Malvin Johns, MD 4/16/202010:33 AM

## 2019-04-09 NOTE — Progress Notes (Signed)
Inpatient Diabetes Program Recommendations  AACE/ADA: New Consensus Statement on Inpatient Glycemic Control (2015)  Target Ranges:  Prepandial:   less than 140 mg/dL      Peak postprandial:   less than 180 mg/dL (1-2 hours)      Critically ill patients:  140 - 180 mg/dL   Lab Results  Component Value Date   GLUCAP 126 (H) 10/09/2018   HGBA1C 7.1 (A) 09/09/2018   HGBA1C 7.1 09/09/2018   HGBA1C 7.1 (A) 09/09/2018   HGBA1C 7.1 (A) 09/09/2018    Review of Glycemic Control  Diabetes history: DM2 Outpatient Diabetes medications: metformin 1000 mg bid, glipizide 10 mg bid Current orders for Inpatient glycemic control: Same as above   Inpatient Diabetes Program Recommendations:     HgbA1C to assess glycemic control PTA Novolog 0-9 units tidwc  Will follow daily.  Thank you. Ailene Ards, RD, LDN, CDE Inpatient Diabetes Coordinator [phone]

## 2019-04-09 NOTE — [facility] Suicide Risk Assessment (Signed)
[facility] INPATIENT:  Family/Significant Other Suicide Prevention Education  Suicide Prevention Education:  Patient Refusal for Family/Significant Other Suicide Prevention Education: The patient William Gonzalez has refused to provide written consent for family/significant other to be provided Family/Significant Other Suicide Prevention Education during admission and/or prior to discharge.  Physician notified.  Darreld Mclean 04/09/2019, 2:04 PM

## 2019-04-09 NOTE — Progress Notes (Signed)
BP elevated this a.m. (See Flow-sheet) and so BP meds was given ahead of scheduled time. Will re-check and inform oncoming RN.

## 2019-04-09 NOTE — [facility] Suicide Risk Assessment (Signed)
[facility] Admission Suicide Risk Assessment   Nursing information obtained from:  Patient Demographic factors:  Male, Age 65 or older, Living alone, Unemployed Current Mental Status:  Self-harm thoughts Loss Factors:  Decline in physical health Historical Factors:  Prior suicide attempts, Family history of mental illness or substance abuse Risk Reduction Factors:  Positive coping skills or problem solving skills  Total Time spent with patient: 45 minutes Principal Problem: Multiple complaints suicidal thoughts vague homicidal thoughts cocaine abuse alcohol dependence depression pending homelessness Diagnosis:  Active Problems:   MDD (major depressive disorder), recurrent severe, without psychosis (HCC)  Subjective Data: Sent in voluntarily for detox and stabilization states he does not really need detox but were not going to take a chance given that he is drinking daily  Continued Clinical Symptoms:  Alcohol Use Disorder Identification Test Final Score (AUDIT): 13 The "Alcohol Use Disorders Identification Test", Guidelines for Use in Primary Care, Second Edition.  World Science writer [facility]). Score between 0-7:  no or low risk or alcohol related problems. Score between 8-15:  moderate risk of alcohol related problems. Score between 16-19:  high risk of alcohol related problems. Score 20 or above:  warrants further diagnostic evaluation for alcohol dependence and treatment.   CLINICAL FACTORS:   Depression:   Severe     COGNITIVE FEATURES THAT CONTRIBUTE TO RISK:  None    SUICIDE RISK: Moderate-endorsing suicidal thoughts but contracting here states that thoughts have dissipated thus far today understands what contracting he is  PLAN OF CARE: Tox begin antidepressants cognitive and rehab therapies  I certify that inpatient services furnished can reasonably be expected to improve the patient's condition.   Malvin Johns, MD 04/09/2019, 10:42 AM

## 2019-04-09 NOTE — Progress Notes (Signed)
CSW received a call from a Marketing executive approximately 15 minutes after meeting with patient for assessment. Patient called their crisis line and stated he was in an inpatient facility experiencing a crisis and that he was not being attended to. CSW took the call from the adult unit nurses station, patient observed watching television in the 300 hall day room at the time of the assessment.  CSW spoke with patient, who appeared to be comfortably watching television and did not appear to be in distress.  CSW updated psychiatry.  Enid Cutter, LCSW-A Clinical Social Worker

## 2019-04-10 DIAGNOSIS — F332 Major depressive disorder, recurrent severe without psychotic features: Principal | ICD-10-CM

## 2019-04-10 NOTE — Tx Team (Signed)
Interdisciplinary Treatment and Diagnostic Plan Update  04/10/2019 Time of Session: 9:00am William Gonzalez MRN: [id number]  Principal Diagnosis: <principal problem not specified>  Secondary Diagnoses: Active Problems:   MDD (major depressive disorder), recurrent severe, without psychosis (HCC)   Current Medications:  Current Facility-Administered Medications  Medication Dose Route Frequency Provider Last Rate Last Dose  . acetaminophen (TYLENOL) tablet 650 mg  650 mg Oral Q4H PRN Laveda Abbe, NP   650 mg at 04/09/19 1235  . alum & mag hydroxide-simeth (MAALOX/MYLANTA) 200-200-20 MG/5ML suspension 30 mL  30 mL Oral Q6H PRN Laveda Abbe, NP      . amantadine (SYMMETREL) capsule 100 mg  100 mg Oral BID Malvin Johns, MD   100 mg at 04/09/19 1722  . amLODipine (NORVASC) tablet 10 mg  10 mg Oral Daily Laveda Abbe, NP   10 mg at 04/09/19 0647  . atorvastatin (LIPITOR) tablet 40 mg  40 mg Oral q1800 Laveda Abbe, NP   40 mg at 04/09/19 1722  . chlordiazePOXIDE (LIBRIUM) capsule 25 mg  25 mg Oral QID Malvin Johns, MD   25 mg at 04/09/19 1722   Followed by  . chlordiazePOXIDE (LIBRIUM) capsule 25 mg  25 mg Oral TID Malvin Johns, MD       Followed by  . [START ON 04/11/2019] chlordiazePOXIDE (LIBRIUM) capsule 25 mg  25 mg Oral Nicki Guadalajara, MD       Followed by  . [START ON 04/13/2019] chlordiazePOXIDE (LIBRIUM) capsule 25 mg  25 mg Oral Daily Malvin Johns, MD      . chlordiazePOXIDE (LIBRIUM) capsule 25 mg  25 mg Oral Q6H PRN Malvin Johns, MD      . clopidogrel (PLAVIX) tablet 75 mg  75 mg Oral Daily Laveda Abbe, NP   75 mg at 04/09/19 8101  . glipiZIDE (GLUCOTROL) tablet 10 mg  10 mg Oral BID AC Laveda Abbe, NP   10 mg at 04/10/19 0604  . latanoprost (XALATAN) 0.005 % ophthalmic solution 1 drop  1 drop Both Eyes QHS Laveda Abbe, NP   1 drop at 04/09/19 2100  . lisinopril (PRINIVIL,ZESTRIL) tablet 20 mg  20 mg Oral Daily  Laveda Abbe, NP   20 mg at 04/09/19 7510  . magnesium hydroxide (MILK OF MAGNESIA) suspension 30 mL  30 mL Oral Daily PRN Laveda Abbe, NP      . metFORMIN (GLUCOPHAGE) tablet 1,000 mg  1,000 mg Oral BID WC Laveda Abbe, NP   1,000 mg at 04/09/19 1722  . metoprolol succinate (TOPROL-XL) 24 hr tablet 50 mg  50 mg Oral Daily Malvin Johns, MD   50 mg at 04/09/19 1231  . nicotine (NICODERM CQ - dosed in mg/24 hours) patch 21 mg  21 mg Transdermal Daily Laveda Abbe, NP   21 mg at 04/09/19 2585  . ondansetron (ZOFRAN) tablet 4 mg  4 mg Oral Q8H PRN Laveda Abbe, NP      . pantoprazole (PROTONIX) EC tablet 40 mg  40 mg Oral Daily Laveda Abbe, NP   40 mg at 04/09/19 0810  . vortioxetine HBr (TRINTELLIX) tablet 10 mg  10 mg Oral Daily Malvin Johns, MD   10 mg at 04/09/19 1231   PTA Medications: Medications Prior to Admission  Medication Sig Dispense Refill Last Dose  . acetaminophen (TYLENOL) 500 MG tablet Take 1,000 mg by mouth daily as needed for mild pain.    Past Month at  Unknown time  . amLODipine (NORVASC) 10 MG tablet Take 1 tablet (10 mg total) by mouth daily. 30 tablet 0 04/06/2019 at Unknown time  . atorvastatin (LIPITOR) 40 MG tablet Take 1 tablet (40 mg total) by mouth daily at 6 PM. 30 tablet 0 04/06/2019 at Unknown time  . clopidogrel (PLAVIX) 75 MG tablet Take 1 tablet (75 mg total) by mouth daily. 30 tablet 0 04/06/2019 at 9am  . Ferrous Sulfate (IRON PO) Take 1 tablet by mouth as directed. Monday, Wednesday, Friday   04/06/2019  . glipiZIDE (GLUCOTROL) 10 MG tablet Take 10 mg by mouth 2 (two) times daily before a meal.   04/06/2019  . hydrocortisone cream 1 % Apply 1 application topically daily as needed for itching (rash).    04/07/2019 at Unknown time  . ibuprofen (ADVIL,MOTRIN) 200 MG tablet Take 400 mg by mouth every 6 (six) hours as needed for headache or moderate pain.   04/06/2019  . latanoprost (XALATAN) 0.005 % ophthalmic solution  Place 1 drop into both eyes at bedtime.   04/06/2019  . Liniments (BLUE-EMU SUPER STRENGTH) CREA Apply 1 application topically daily as needed (shoulder or back pain).    04/07/2019 at Unknown time  . lisinopril (PRINIVIL,ZESTRIL) 20 MG tablet Take 1 tablet (20 mg total) by mouth daily. 30 tablet 0 04/06/2019 at Unknown time  . metFORMIN (GLUCOPHAGE) 1000 MG tablet Take 1 tablet (1,000 mg total) by mouth 2 (two) times daily with a meal. 60 tablet 0 04/06/2019 at Unknown time  . omeprazole (PRILOSEC) 20 MG capsule Take 20 mg by mouth daily.   04/06/2019  . sertraline (ZOLOFT) 100 MG tablet Take 1 tablet (100 mg total) by mouth 2 (two) times daily. (Patient not taking: Reported on 04/21/2015) 60 tablet 0 Not Taking at Unknown time    Patient Stressors: Health problems Marital or family conflict Substance abuse  Patient Strengths: Ability for insight Average or above average intelligence Capable of independent living [employer] of knowledge Motivation for treatment/growth Supportive family/friends  Treatment Modalities: Medication Management, Group therapy, Case management,  1 to 1 session with clinician, Psychoeducation, Recreational therapy.   Physician Treatment Plan for Primary Diagnosis: <principal problem not specified> Long Term Goal(s): Improvement in symptoms so as ready for discharge Improvement in symptoms so as ready for discharge   Short Term Goals: Ability to disclose and discuss suicidal ideas Ability to identify triggers associated with substance abuse/mental health issues will improve  Medication Management: Evaluate patient's response, side effects, and tolerance of medication regimen.  Therapeutic Interventions: 1 to 1 sessions, Unit Group sessions and Medication administration.  Evaluation of Outcomes: Progressing  Physician Treatment Plan for Secondary Diagnosis: Active Problems:   MDD (major depressive disorder), recurrent severe, without psychosis (HCC)  Long  Term Goal(s): Improvement in symptoms so as ready for discharge Improvement in symptoms so as ready for discharge   Short Term Goals: Ability to disclose and discuss suicidal ideas Ability to identify triggers associated with substance abuse/mental health issues will improve     Medication Management: Evaluate patient's response, side effects, and tolerance of medication regimen.  Therapeutic Interventions: 1 to 1 sessions, Unit Group sessions and Medication administration.  Evaluation of Outcomes: Progressing   RN Treatment Plan for Primary Diagnosis: <principal problem not specified> Long Term Goal(s): Knowledge of disease and therapeutic regimen to maintain health will improve  Short Term Goals: Ability to participate in decision making will improve, Ability to identify and develop effective coping behaviors will improve and Compliance  with prescribed medications will improve  Medication Management: RN will administer medications as ordered by provider, will assess and evaluate patient's response and provide education to patient for prescribed medication. RN will report any adverse and/or side effects to prescribing provider.  Therapeutic Interventions: 1 on 1 counseling sessions, Psychoeducation, Medication administration, Evaluate responses to treatment, Monitor vital signs and CBGs as ordered, Perform/monitor CIWA, COWS, AIMS and Fall Risk screenings as ordered, Perform wound care treatments as ordered.  Evaluation of Outcomes: Progressing   LCSW Treatment Plan for Primary Diagnosis: <principal problem not specified> Long Term Goal(s): Safe transition to appropriate next level of care at discharge, Engage patient in therapeutic group addressing interpersonal concerns.  Short Term Goals: Engage patient in aftercare planning with referrals and resources, Increase social support, Identify triggers associated with mental health/substance abuse issues and Increase skills for wellness and  recovery  Therapeutic Interventions: Assess for all discharge needs, 1 to 1 time with Social worker, Explore available resources and support systems, Assess for adequacy in community support network, Educate family and significant other(s) on suicide prevention, Complete Psychosocial Assessment, Interpersonal group therapy.  Evaluation of Outcomes: Progressing   Progress in Treatment: Attending groups: No. Participating in groups: No. Taking medication as prescribed: Yes. Toleration medication: Yes. Family/Significant other contact made: No, will contact:  patient declines consents, reviewed SPE with patient. Patient understands diagnosis: Yes. Discussing patient identified problems/goals with staff: Yes. Medical problems stabilized or resolved: Yes. Denies suicidal/homicidal ideation: No. Issues/concerns per patient self-inventory: Yes.   New problem(s) identified: Yes, Describe:  limited social supports  New Short Term/Long Term Goal(s): detox, medication management for mood stabilization; elimination of SI thoughts; development of comprehensive mental wellness/sobriety plan.  Patient Goals:  [facility] [place]  Discharge Plan or Barriers: Likely discharging and staying with family or shelter resources. [facility] for outpatient care.  Reason for Continuation of Hospitalization: Anxiety Depression Suicidal ideation  Estimated Length of Stay: Monday, 04/13/2019  Attendees: Patient: William Gonzalez 04/10/2019 8:58 AM  Physician:  04/10/2019 8:58 AM  Nursing:  04/10/2019 8:58 AM  RN Care Manager: 04/10/2019 8:58 AM  Social Worker: Enid Cutter, Theresia Majors 04/10/2019 8:58 AM  Recreational Therapist:  04/10/2019 8:58 AM  Other:  04/10/2019 8:58 AM  Other:  04/10/2019 8:58 AM  Other: 04/10/2019 8:58 AM    Scribe for Treatment Team: Darreld Mclean, LCSWA 04/10/2019 8:58 AM

## 2019-04-10 NOTE — Progress Notes (Signed)
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 Alcohol abuse   . Coronary artery disease   . Diabetes mellitus   . Hypertension   . Stented coronary artery   . Tobacco abuse     Past Surgical History:  Procedure Laterality Date  . CORONARY STENT PLACEMENT     Family History:  Family History  Problem Relation Age of Onset  . Hypertension Mother   . Diabetes Mother   . Hypertension Father   . Diabetes Father   . Hypertension Sister   . Hypertension Brother   . Diabetes Sister   . Diabetes Brother     Social History:  Social History   Substance and Sexual Activity  Alcohol Use Yes  . Alcohol/week: 1.0 standard drinks  . Types: 1 Cans of beer per week   Comment: occassionally      Social History   Substance and Sexual Activity  Drug Use Yes  . Types: Cocaine    Social History   Socioeconomic History  . Marital status: Divorced    Spouse name: Not on file  . Number of children: Not on file  . Years of education: Not on file  . Highest education level: Not on file  Occupational History  . Not on file  Social Needs  . Financial resource strain: Not on file  . Food insecurity:    Worry: Not on file    Inability: Not on file  . Transportation needs:    Medical: Not on file    Non-medical: Not on file  Tobacco Use  . Smoking status: Current Every Day Smoker    Packs/day: 0.30    Types: Cigarettes  .  Smokeless tobacco: Never Used  Substance and Sexual Activity  . Alcohol use: Yes    Alcohol/week: 1.0 standard drinks    Types: 1 Cans of beer per week    Comment: occassionally   . Drug use: Yes    Types: Cocaine  . Sexual activity: Not Currently  Lifestyle  . Physical activity:    Days per week: Not on file    Minutes per session: Not on file  . Stress: Not on file  Relationships  . Social connections:    Talks on phone: Not on file    Gets together: Not on file    Attends religious service: Not on file    Active member of club or organization: Not on file    Attends meetings of clubs or organizations: Not on file    Relationship status: Not on file  Other Topics Concern  . Not on file  Social History Narrative  . Not on file   Additional Social History:                         Sleep: Good  Appetite:  Good  Current Medications: Current Facility-Administered Medications  Medication Dose Route Frequency Provider Last Rate Last Dose  . acetaminophen (  TYLENOL) tablet 650 mg  650 mg Oral Q4H PRN Laveda Abbe, NP   650 mg at 04/09/19 1235  . alum & mag hydroxide-simeth (MAALOX/MYLANTA) 200-200-20 MG/5ML suspension 30 mL  30 mL Oral Q6H PRN Laveda Abbe, NP      . amantadine (SYMMETREL) capsule 100 mg  100 mg Oral BID Malvin Johns, MD   100 mg at 04/09/19 1722  . amLODipine (NORVASC) tablet 10 mg  10 mg Oral Daily Laveda Abbe, NP   10 mg at 04/09/19 0647  . atorvastatin (LIPITOR) tablet 40 mg  40 mg Oral q1800 Laveda Abbe, NP   40 mg at 04/09/19 1722  . chlordiazePOXIDE (LIBRIUM) capsule 25 mg  25 mg Oral QID Malvin Johns, MD   25 mg at 04/09/19 1722   Followed by  . chlordiazePOXIDE (LIBRIUM) capsule 25 mg  25 mg Oral TID Malvin Johns, MD       Followed by  . [START ON 04/11/2019] chlordiazePOXIDE (LIBRIUM) capsule 25 mg  25 mg Oral Nicki Guadalajara, MD       Followed by  . [START ON 04/13/2019] chlordiazePOXIDE (LIBRIUM)  capsule 25 mg  25 mg Oral Daily Malvin Johns, MD      . chlordiazePOXIDE (LIBRIUM) capsule 25 mg  25 mg Oral Q6H PRN Malvin Johns, MD      . clopidogrel (PLAVIX) tablet 75 mg  75 mg Oral Daily Laveda Abbe, NP   75 mg at 04/09/19 8676  . glipiZIDE (GLUCOTROL) tablet 10 mg  10 mg Oral BID AC Laveda Abbe, NP   10 mg at 04/10/19 0604  . latanoprost (XALATAN) 0.005 % ophthalmic solution 1 drop  1 drop Both Eyes QHS Laveda Abbe, NP   1 drop at 04/09/19 2100  . lisinopril (PRINIVIL,ZESTRIL) tablet 20 mg  20 mg Oral Daily Laveda Abbe, NP   20 mg at 04/09/19 1950  . magnesium hydroxide (MILK OF MAGNESIA) suspension 30 mL  30 mL Oral Daily PRN Laveda Abbe, NP      . metFORMIN (GLUCOPHAGE) tablet 1,000 mg  1,000 mg Oral BID WC Laveda Abbe, NP   1,000 mg at 04/09/19 1722  . metoprolol succinate (TOPROL-XL) 24 hr tablet 50 mg  50 mg Oral Daily Malvin Johns, MD   50 mg at 04/09/19 1231  . nicotine (NICODERM CQ - dosed in mg/24 hours) patch 21 mg  21 mg Transdermal Daily Laveda Abbe, NP   21 mg at 04/09/19 9326  . ondansetron (ZOFRAN) tablet 4 mg  4 mg Oral Q8H PRN Laveda Abbe, NP      . pantoprazole (PROTONIX) EC tablet 40 mg  40 mg Oral Daily Laveda Abbe, NP   40 mg at 04/09/19 0810  . vortioxetine HBr (TRINTELLIX) tablet 10 mg  10 mg Oral Daily Malvin Johns, MD   10 mg at 04/09/19 1231    Lab Results:  Results for orders placed or performed during the hospital encounter of 04/08/19 (from the past 48 hour(s))  Comprehensive metabolic panel     Status: Abnormal   Collection Time: 04/08/19 12:00 PM  Result Value Ref Range   Sodium 134 (L) 135 - 145 mmol/L   Potassium 4.5 3.5 - 5.1 mmol/L   Chloride 104 98 - 111 mmol/L   CO2 21 (L) 22 - 32 mmol/L   Glucose, Bld 251 (H) 70 - 99 mg/dL   BUN 28 (H) 8 - 23 mg/dL  Creatinine, Ser 1.38 (H) 0.61 - 1.24 mg/dL   Calcium 9.4 8.9 - 16.1 mg/dL   Total Protein 7.3 6.5 - 8.1  g/dL   Albumin 4.2 3.5 - 5.0 g/dL   AST 12 (L) 15 - 41 U/L   ALT 14 0 - 44 U/L   Alkaline Phosphatase 110 38 - 126 U/L   Total Bilirubin 0.3 0.3 - 1.2 mg/dL   GFR calc non Af Amer 53 (L) >60 mL/min   GFR calc Af Amer >60 >60 mL/min   Anion gap 9 5 - 15    Comment: Performed at [facility], [address], [place], [place] [address]  Ethanol     Status: None   Collection Time: 04/08/19 12:00 PM  Result Value Ref Range   Alcohol, Ethyl (B) <10 <10 mg/dL    Comment: (NOTE) Lowest detectable limit for serum alcohol is 10 mg/dL. For medical purposes only. Performed at [facility], [address], [place], [place] [address]   CBC with Diff     Status: Abnormal   Collection Time: 04/08/19 12:00 PM  Result Value Ref Range   WBC 13.8 (H) 4.0 - 10.5 K/uL   RBC 3.90 (L) 4.22 - 5.81 MIL/uL   Hemoglobin 12.8 (L) 13.0 - 17.0 g/dL   HCT 11.9 (L) 14.7 - 82.9 %   MCV 99.7 80.0 - 100.0 fL   MCH 32.8 26.0 - 34.0 pg   MCHC 32.9 30.0 - 36.0 g/dL   RDW 56.2 13.0 - 86.5 %   Platelets 324 150 - 400 K/uL   nRBC 0.0 0.0 - 0.2 %   Neutrophils Relative % 77 %   Neutro Abs 10.5 (H) 1.7 - 7.7 K/uL   Lymphocytes Relative 15 %   Lymphs Abs 2.0 0.7 - 4.0 K/uL   Monocytes Relative 6 %   Monocytes Absolute 0.8 0.1 - 1.0 K/uL   Eosinophils Relative 2 %   Eosinophils Absolute 0.3 0.0 - 0.5 K/uL   Basophils Relative 0 %   Basophils Absolute 0.0 0.0 - 0.1 K/uL   Immature Granulocytes 0 %   Abs Immature Granulocytes 0.05 0.00 - 0.07 K/uL    Comment: Performed at [facility], [address] [place], [place] [address]  Urine rapid drug screen (hosp performed)     Status: Abnormal   Collection Time: 04/08/19 12:58 PM  Result Value Ref Range   Opiates NONE DETECTED NONE DETECTED   Cocaine POSITIVE (A) NONE DETECTED   Benzodiazepines NONE DETECTED NONE DETECTED   Amphetamines NONE DETECTED NONE DETECTED   Tetrahydrocannabinol NONE DETECTED NONE  DETECTED   Barbiturates NONE DETECTED NONE DETECTED    Comment: (NOTE) DRUG SCREEN FOR MEDICAL PURPOSES ONLY.  IF CONFIRMATION IS NEEDED FOR ANY PURPOSE, NOTIFY LAB WITHIN 5 DAYS. LOWEST DETECTABLE LIMITS FOR URINE DRUG SCREEN Drug Class                     Cutoff (ng/mL) Amphetamine and metabolites    1000 Barbiturate and metabolites    200 Benzodiazepine                 200 Tricyclics and metabolites     300 Opiates and metabolites        300 Cocaine and metabolites        300 THC  50 Performed at [facility], [address] [place], [place] [id number]     Blood Alcohol level:  Lab Results  Component Value Date   ETH <10 04/08/2019   ETH <10 10/09/2018    Metabolic Disorder Labs: Lab Results  Component Value Date   HGBA1C 7.1 (A) 09/09/2018   HGBA1C 7.1 09/09/2018   HGBA1C 7.1 (A) 09/09/2018   HGBA1C 7.1 (A) 09/09/2018   MPG 157.07 04/19/2018   MPG 163 04/22/2015   No results found for: PROLACTIN Lab Results  Component Value Date   CHOL 137 04/19/2018   TRIG 79 04/19/2018   HDL 50 04/19/2018   CHOLHDL 2.7 04/19/2018   VLDL 16 04/19/2018   LDLCALC 71 04/19/2018   LDLCALC 88 04/22/2015    Physical Findings: AIMS: Facial and Oral Movements Muscles of Facial Expression: None, normal Lips and Perioral Area: None, normal Jaw: None, normal Tongue: None, normal,Extremity Movements Upper (arms, wrists, hands, fingers): None, normal Lower (legs, knees, ankles, toes): None, normal, Trunk Movements Neck, shoulders, hips: None, normal, Overall Severity Severity of abnormal movements (highest score from questions above): None, normal Incapacitation due to abnormal movements: None, normal Patient's awareness of abnormal movements (rate only patient's report): No Awareness, Dental Status Current problems with teeth and/or dentures?: Yes Does patient usually wear dentures?: No  CIWA:    COWS:      Musculoskeletal: Strength & Muscle Tone: decreased Gait & Station: unsteady Patient leans: Front and N/A  Psychiatric Specialty Exam: Physical Exam  ROS  Blood pressure (!) 154/75, pulse 73, temperature 98.6 F (37 C), temperature source Oral, resp. rate 18, height 5\' 6"  (1.676 m), weight 72.6 kg.Body mass index is 25.82 kg/m.  General Appearance: Casual and Disheveled  Eye Contact:  Minimal  Speech:  Slow  Volume:  Decreased  Mood:  Anxious and Depressed  Affect:  Appropriate  Thought Process:  Coherent  Orientation:  Other:  ox3  Thought Content:  Logical  Suicidal Thoughts:  No  Homicidal Thoughts:  No  Memory:  Immediate;   Fair  Judgement:  Fair  Insight:  Fair  Psychomotor Activity:  Normal  Concentration:  Concentration: Fair  Recall:  [employer]  Fund of Knowledge:  Fair  Language:  Fair  Akathisia:  Negative  Handed:  Right  AIMS (if indicated):     Assets: [facility] benefits  ADL's:  Intact  Cognition:  WNL  Sleep:  Number of Hours: 5.25     Treatment Plan Summary: Daily contact with patient to assess and evaluate symptoms and progress in treatment, Medication management and Plan To new detox regimen continue to seek rehab options patient understands that [facility] is generally not except[facility] no thoughts of harming self today contracting here  Malvin JohnsFARAH,Tomoya Ringwald, MD 04/10/2019, 11:01 AM

## 2019-04-11 DIAGNOSIS — Z590 Homelessness: Secondary | ICD-10-CM

## 2019-04-11 NOTE — Progress Notes (Signed)
Adult Psychoeducational Group Note  Date:  04/11/2019 Time:  8:49 PM  Group Topic/Focus:  Wrap-Up Group:   The focus of this group is to help patients review their daily goal of treatment and discuss progress on daily workbooks.  Participation Level:  Active  Participation Quality:  Appropriate  Affect:  Appropriate  Cognitive:  Alert  Insight: Appropriate  Engagement in Group:  Engaged  Modes of Intervention:  Discussion  Additional Comments:  Pt rated his day 3/10. The most helping coping skill that he has gained while in the hospital is sharing with others. When pt was asked what he would take away from being in the hospital is to talk to someone.   Flonnie Hailstone 04/11/2019, 8:49 PM

## 2019-04-11 NOTE — [facility] Group Notes (Signed)
[facility] Group Notes:  (Nursing/)  Date:  04/11/2019  Time:  130 Type of Therapy:  Nurse Education  Participation Level:  Did Not Attend   Shela Nevin 04/11/2019, 7:45 PM

## 2019-04-11 NOTE — Progress Notes (Signed)
Pt was in bed in his room upon initial approach.  He presents with depressed affect and mood.  He reports he feels "sluggish."  He states "[facility]  Pt was encouraged to discuss further with social worker.  Denies SI/HI, denies hallucinations, reports back pain of 5/10.  Pt has been isolative to his room for the majority of the evening with few peer interactions.  Met with pt 1:1.  Actively listened to pt and provided support and encouragement.  Medication administered per order.  PRN medication administered for pain.  Q15 minute safety checks maintained.  Pt is compliant with medications.  He reports he will inform staff of needs and concerns.  Pt verbally contracts for safety.  Will continue to monitor and assess.  

## 2019-04-11 NOTE — Progress Notes (Signed)
   04/11/19 0428  COVID-19 Daily Checkoff  Have you had a fever (temp > 37.80C/100F)  in the past 24 hours?  No  COVID-19 EXPOSURE  Have you traveled outside the state in the past 14 days? No  Have you been in contact with someone with a confirmed diagnosis of COVID-19 or PUI in the past 14 days without wearing appropriate PPE? No  Have you been living in the same home as a person with confirmed diagnosis of COVID-19 or a PUI (household contact)? No  Have you been diagnosed with COVID-19? No

## 2019-04-11 NOTE — Progress Notes (Signed)
Adult Psychoeducational Group Note  Date:  04/11/2019 Time:  4:15pm-5:25pm  Group Topic/Focus:  Emotional Education:   The focus of this group is to discuss what angry feelings/emotions are, and how they are experienced.  Participation Level:  Active  Participation Quality:  Appropriate and Attentive  Affect:  Appropriate  Cognitive:  Alert and Appropriate  Insight: Appropriate  Engagement in Group:  Engaged  Modes of Intervention:  Discussion and Education  Additional Comments: Patient informed the group that he is currently struggling to resolve issues with the assistance that he needs from the [place]. Patient expressed feeling anger towards the fact that while he was in the [employer] he was able to have everything he needed to do his job well, but following his commitment he has been met with people who seem to consistently be unable to assist him. MHT normalized the patient's feelings regarding this issue. MHT reviewed 6 steps to solving anger problems and explained the importance of communication, a calm attitude, and listening in successful resolution of conflict that has resulted in anger problems.    William Gonzalez 04/11/2019, 7:17 PM

## 2019-04-11 NOTE — Progress Notes (Signed)
Adult Psychoeducational Group Note  Date:  04/11/2019 Time:  12:34 AM  Group Topic/Focus:  Wrap-Up Group:   The focus of this group is to help patients review their daily goal of treatment and discuss progress on daily workbooks.  Participation Level:  Minimal  Participation Quality:  Drowsy  Affect:  Flat  Cognitive:  Disorganized  Insight: Lacking  Engagement in Group:  Limited  Modes of Intervention:  Limit-setting  Additional Comments:  Pt said his day was a 1. The one positive thing that happened to him today was nothing all day.  Charna Busman Long 04/11/2019, 12:34 AM

## 2019-04-11 NOTE — Plan of Care (Signed)
D: Patient presents depressed, irritable. He isolates to his room mostly. He refused his morning meds, until almost lunch time. At that time he agreed to take them. He did not take his evening medications, after he became upset that he could not stay in the dayroom for dinner. Patient denies SI/HI/AVH. Patient did not fill out a self-inventory. A: Patient checked q15 min, and checks reviewed. Reviewed medication changes with patient and educated on side effects. Educated patient on importance of attending group therapy sessions and educated on several coping skills. Encouarged participation in milieu through recreation therapy and attending meals with peers. Support and encouragement provided. Fluids offered. R: Patient contracts for safety on the unit.  Problem: Education: Goal: Emotional status will improve Outcome: Not Progressing Goal: Mental status will improve Outcome: Not Progressing   Problem: Activity: Goal: Interest or engagement in activities will improve Outcome: Not Progressing Goal: Sleeping patterns will improve Outcome: Not Progressing

## 2019-04-11 NOTE — Progress Notes (Signed)
D: Pt was in dayroom upon initial approach.  Pt presents with depressed affect and mood.  He reports his day was "so so."  His goal is "just to try to get better."  He reports he feels about the same today as he did yesterday, stating "I feel down in the dumps."  Pt denies SI/HI, denies hallucinations, reports back pain of 7/10.  Pt has been visible in milieu interacting with peers and staff appropriately.  He has been using a walker for mobility.   A: Introduced self to pt.  Met with pt 1:1.  Actively listened to pt and offered support and encouragement.  Medications administered per order.  PRN medication administered for pain.  Q15 minute safety checks maintained.  R: Pt is safe on the unit.  Pt is compliant with medications.  Pt verbally contracts for safety.  Will continue to monitor and assess.

## 2019-04-11 NOTE — Progress Notes (Addendum)
[facility] MD Progress Note  04/11/2019 9:52 AM William Gonzalez  MRN:  [id number] Subjective:  Patient reports some improvement compared to admission, but reports he is still feeling depressed, which he attributes in part to homelessness and to decreased level of functioning S/P CVA. Denies suicidal ideations. Currently denies medication side effects. Objective : I have discussed case with treatment team and have met with patient. 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  Currently reports he is feeling better than he did prior to admission but continues to report depression and a sense of hopelessness regarding neurological deficits. States " I can no longer do things like I did". He denies suicidal ideations and currently contracts for safety on unit. Currently does not endorse medication side effects other than increased vivid dreams/nightmares . Limited milieu participation.  Currently does not endorse significant withdrawal symptoms and does not appear to be in any acute distress .   Principal Problem: Call is him depression post CVA/homelessness Diagnosis: Active Problems:   MDD (major depressive disorder), recurrent severe, without psychosis ([place])  Total Time spent with patient: 15 minutes  Past Medical History:  Past Medical History:  Diagnosis Date  . Alcohol abuse   . Coronary artery disease   . Diabetes mellitus   . Hypertension   . Stented coronary artery   . Tobacco abuse     Past Surgical History:  Procedure Laterality Date  . CORONARY STENT PLACEMENT     Family History:  Family History  Problem Relation Age of Onset  . Hypertension Mother   . Diabetes Mother   .  Hypertension Father   . Diabetes Father   . Hypertension Sister   . Hypertension Brother   . Diabetes Sister   . Diabetes Brother     Social History:  Social History   Substance and Sexual Activity  Alcohol Use Yes  . Alcohol/week: 1.0 standard drinks  . Types: 1 Cans of beer per week   Comment: occassionally      Social History   Substance and Sexual Activity  Drug Use Yes  . Types: Cocaine    Social History   Socioeconomic History  . Marital status: Divorced    Spouse name: Not on file  . Number of children: Not on file  . Years of education: Not on file  . Highest education level: Not on file  Occupational History  . Not on file  Social Needs  . Financial resource strain: Not on file  . Food insecurity:    Worry: Not on file    Inability: Not on file  . Transportation needs:    Medical: Not on file    Non-medical: Not on file  Tobacco Use  . Smoking status: Current Every Day Smoker    Packs/day: 0.30    Types: Cigarettes  . Smokeless tobacco: Never Used  Substance and Sexual Activity  . Alcohol use: Yes    Alcohol/week: 1.0 standard drinks    Types: 1 Cans of beer per week    Comment: occassionally   . Drug use: Yes    Types: Cocaine  . Sexual activity: Not Currently  Lifestyle  . Physical activity:    Days per week: Not on file  Minutes per session: Not on file  . Stress: Not on file  Relationships  . Social connections:    Talks on phone: Not on file    Gets together: Not on file    Attends religious service: Not on file    Active member of club or organization: Not on file    Attends meetings of clubs or organizations: Not on file    Relationship status: Not on file  Other Topics Concern  . Not on file  Social History Narrative  . Not on file   Additional Social History:   Sleep: Good  Appetite:  Good  Current Medications: Current Facility-Administered Medications  Medication Dose Route Frequency Provider Last Rate Last Dose  .  acetaminophen (TYLENOL) tablet 650 mg  650 mg Oral Q4H PRN Ethelene Hal, NP   650 mg at 04/10/19 2136  . alum & mag hydroxide-simeth (MAALOX/MYLANTA) 200-200-20 MG/5ML suspension 30 mL  30 mL Oral Q6H PRN Ethelene Hal, NP      . amantadine (SYMMETREL) capsule 100 mg  100 mg Oral BID Johnn Hai, MD   100 mg at 04/10/19 1726  . amLODipine (NORVASC) tablet 10 mg  10 mg Oral Daily Ethelene Hal, NP   10 mg at 04/10/19 1308  . atorvastatin (LIPITOR) tablet 40 mg  40 mg Oral q1800 Ethelene Hal, NP   40 mg at 04/10/19 1726  . chlordiazePOXIDE (LIBRIUM) capsule 25 mg  25 mg Oral TID Johnn Hai, MD   25 mg at 04/10/19 1728   Followed by  . chlordiazePOXIDE (LIBRIUM) capsule 25 mg  25 mg Oral Celine Ahr, MD       Followed by  . [START ON 04/13/2019] chlordiazePOXIDE (LIBRIUM) capsule 25 mg  25 mg Oral Daily Johnn Hai, MD      . chlordiazePOXIDE (LIBRIUM) capsule 25 mg  25 mg Oral Q6H PRN Johnn Hai, MD      . clopidogrel (PLAVIX) tablet 75 mg  75 mg Oral Daily Ethelene Hal, NP   75 mg at 04/10/19 1310  . glipiZIDE (GLUCOTROL) tablet 10 mg  10 mg Oral BID AC Ethelene Hal, NP   10 mg at 04/11/19 0641  . latanoprost (XALATAN) 0.005 % ophthalmic solution 1 drop  1 drop Both Eyes QHS Ethelene Hal, NP   1 drop at 04/10/19 2136  . lisinopril (PRINIVIL,ZESTRIL) tablet 20 mg  20 mg Oral Daily Ethelene Hal, NP   20 mg at 04/10/19 1309  . magnesium hydroxide (MILK OF MAGNESIA) suspension 30 mL  30 mL Oral Daily PRN Ethelene Hal, NP      . metFORMIN (GLUCOPHAGE) tablet 1,000 mg  1,000 mg Oral BID WC Ethelene Hal, NP   1,000 mg at 04/10/19 1726  . metoprolol succinate (TOPROL-XL) 24 hr tablet 50 mg  50 mg Oral Daily Johnn Hai, MD   50 mg at 04/10/19 1309  . nicotine (NICODERM CQ - dosed in mg/24 hours) patch 21 mg  21 mg Transdermal Daily Ethelene Hal, NP   21 mg at 04/10/19 1312  . ondansetron  (ZOFRAN) tablet 4 mg  4 mg Oral Q8H PRN Ethelene Hal, NP      . pantoprazole (PROTONIX) EC tablet 40 mg  40 mg Oral Daily Ethelene Hal, NP   40 mg at 04/10/19 1308  . vortioxetine HBr (TRINTELLIX) tablet 10 mg  10 mg Oral Daily Johnn Hai, MD   10 mg at 04/10/19  1309    Lab Results:  No results found for this or any previous visit (from the past 48 hour(s)).  Blood Alcohol level:  Lab Results  Component Value Date   ETH <10 04/08/2019   ETH <10 75/91/6384    Metabolic Disorder Labs: Lab Results  Component Value Date   HGBA1C 7.1 (A) 09/09/2018   HGBA1C 7.1 09/09/2018   HGBA1C 7.1 (A) 09/09/2018   HGBA1C 7.1 (A) 09/09/2018   MPG 157.07 04/19/2018   MPG 163 04/22/2015   No results found for: PROLACTIN Lab Results  Component Value Date   CHOL 137 04/19/2018   TRIG 79 04/19/2018   HDL 50 04/19/2018   CHOLHDL 2.7 04/19/2018   VLDL 16 04/19/2018   LDLCALC 71 04/19/2018   LDLCALC 88 04/22/2015    Physical Findings: AIMS: Facial and Oral Movements Muscles of Facial Expression: None, normal Lips and Perioral Area: None, normal Jaw: None, normal Tongue: None, normal,Extremity Movements Upper (arms, wrists, hands, fingers): None, normal Lower (legs, knees, ankles, toes): None, normal, Trunk Movements Neck, shoulders, hips: None, normal, Overall Severity Severity of abnormal movements (highest score from questions above): None, normal Incapacitation due to abnormal movements: None, normal Patient's awareness of abnormal movements (rate only patient's report): No Awareness, Dental Status Current problems with teeth and/or dentures?: Yes Does patient usually wear dentures?: No  CIWA:    COWS:     Musculoskeletal: Strength & Muscle Tone: decreased Gait & Station: unsteady- mobilizes with a walker  Patient leans: Front and N/A  Psychiatric Specialty Exam: Physical Exam  ROS denies chest pain or shortness of breath, no coughing, no fever   Blood  pressure (!) 154/75, pulse 73, temperature 98.6 F (37 C), temperature source Oral, resp. rate 18, height 5' 6"  (1.676 m), weight 72.6 kg.Body mass index is 25.82 kg/m.  General Appearance: Fairly Groomed  Eye Contact:  Fair, improves during session  Speech:  Slow  Volume:  Normal  Mood:  states he is feeling better than on admission but still depressed   Affect:  Congruent  Thought Process:  Linear and Descriptions of Associations: Intact  Orientation:  Other:  presents oriented x 3   Thought Content:  no hallucinations, no delusions, does not present internally preoccupied at this time  Suicidal Thoughts:  No no current suicidal or self injurious ideations, contracts for safety on unit  Homicidal Thoughts:  No at this time denies homicidal ideations  Memory:  recent and remote fair   Judgement:  Fair  Insight:  Fair  Psychomotor Activity:  Decreased  Concentration:  Concentration: Fair and Attention Span: Fair  Recall:  [employer] of Knowledge:  Fair  Language:  Fair  Akathisia:  Negative  Handed:  Right  AIMS (if indicated):     Assets: [employer] benefits  ADL's:  Intact  Cognition:  WNL  Sleep:  Number of Hours: 6.75   Assessment :  63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  At this time patient reports partial improvement but remains depressed , which he attributes in part to homelessness and to decreased level of functioning /independence following CVA. Denies medication side effects , denies SI at this time.  Treatment Plan Summary: Treatment Plan reviewed as below today 4/18. Encourage group  and milieu participation to work on coping skills and symptom reduction Encourage efforts to work on sobriety and  relapse prevention Treatment team working on disposition planning options Continue Trintellix 10 mgrs QDAY for depression Continue Glucophage and Glipizide for DM Continue Toprol XL,Norvasc , Lisinopril for HTN Continue Plavix for history of CAD  Continue Lipitor for Hypercholesterolemia Completing Librium detox protocol for alcohol WDL  Check BMP in AM to monitor Cr, check HgbA1C and Lipid Panel.  Jenne Campus, MD 04/11/2019, 9:52 AM   Patient ID: William Gonzalez, male   DOB: 12-01-1956, 63 y.o.   MRN: [id number]

## 2019-04-11 NOTE — [facility] Group Notes (Signed)
LCSW Group Therapy Note  04/11/2019    10:00-11:00am   Type of Therapy and Topic:  Group Therapy: Early Messages Received About Anger  Participation Level:  Did Not Attend   Description of Group:   In this group, patients shared and discussed the early messages received in their lives about anger through parental or other adult modeling, teaching, repression, punishment, violence, and more.  Participants identified how those childhood lessons influence even now how they usually or often react when angered.  The group discussed that anger is a secondary emotion and what may be the underlying emotional themes that come out through anger outbursts or that are ignored through anger suppression.  Finally, as a group there was a conversation about the workbook's quote that "There is nothing wrong with anger; it is just a sign something needs to change."     Therapeutic Goals: 1. Patients will identify one or more childhood message about anger that they received and how it was taught to them. 2. Patients will discuss how these childhood experiences have influenced and continue to influence their own expression or repression of anger even today. 3. Patients will explore possible primary emotions that tend to fuel their secondary emotion of anger. 4. Patients will learn that anger itself is normal and cannot be eliminated, and that healthier coping skills can assist with resolving conflict rather than worsening situations.  Summary of Patient Progress:  did not attend  Therapeutic Modalities:   Cognitive Behavioral Therapy Motivation Interviewing  Henrene Dodge

## 2019-04-11 NOTE — Progress Notes (Signed)
   04/11/19 1121  COVID-19 Daily Checkoff  Have you had a fever (temp > 37.80C/100F)  in the past 24 hours?  No  If you have had runny nose, nasal congestion, sneezing in the past 24 hours, has it worsened? No  COVID-19 EXPOSURE  Have you traveled outside the state in the past 14 days? No  Have you been in contact with someone with a confirmed diagnosis of COVID-19 or PUI in the past 14 days without wearing appropriate PPE? No  Have you been living in the same home as a person with confirmed diagnosis of COVID-19 or a PUI (household contact)? No  Have you been diagnosed with COVID-19? No

## 2019-04-12 LAB — BASIC METABOLIC PANEL
Anion gap: 8 (ref 5–15)
BUN: 19 mg/dL (ref 8–23)
CO2: 21 mmol/L — ABNORMAL LOW (ref 22–32)
Calcium: 9.5 mg/dL (ref 8.9–10.3)
Chloride: 110 mmol/L (ref 98–111)
Creatinine, Ser: 0.99 mg/dL (ref 0.61–1.24)
GFR calc Af Amer: >60 mL/min (ref >60)
GFR calc non Af Amer: >60 mL/min (ref >60)
Glucose, Bld: 169 mg/dL — ABNORMAL HIGH (ref 70–99)
Potassium: 4.1 mmol/L (ref 3.5–5.1)
Sodium: 139 mmol/L (ref 135–145)

## 2019-04-12 LAB — LIPID PANEL
Cholesterol: 143 mg/dL (ref 0–200)
HDL: 38 mg/dL — ABNORMAL LOW (ref >40)
LDL Cholesterol: 91 mg/dL (ref 0–99)
Total CHOL/HDL Ratio: 3.8 RATIO
Triglycerides: 69 mg/dL (ref <150)
VLDL: 14 mg/dL (ref 0–40)

## 2019-04-12 LAB — HEMOGLOBIN A1C
Hgb A1c MFr Bld: 7.8 % — ABNORMAL HIGH (ref 4.8–5.6)
Mean Plasma Glucose: 177.16 mg/dL

## 2019-04-12 LAB — TSH: TSH: 1.466 u[IU]/mL (ref 0.350–4.500)

## 2019-04-12 MED ORDER — TERBINAFINE HCL 1 % EX CREA
TOPICAL_CREAM | Freq: Two times a day (BID) | CUTANEOUS | Status: DC
  Administered 2019-04-12: 08:00:00 via TOPICAL
  Administered 2019-04-12: 1 via TOPICAL
  Filled 2019-04-12 (×2): qty 12

## 2019-04-12 NOTE — Progress Notes (Signed)
D.  Pt pleasant on approach, no complaints voiced at this time.  Pt observed up in dayroom appropriately engaged with peers on the unit.  Pt denies SI/HI/AVH at this time.  A.  Support and encouragement offered, medication given as ordered  R. Pt remains safe on the unit, will continue to monitor.

## 2019-04-12 NOTE — [facility] Group Notes (Signed)
[facility] LCSW Group Therapy Note  Date/Time:  04/12/2019 9:00-10:00 or 10:00-11:00AM  Type of Therapy and Topic:  Group Therapy:  Healthy and Unhealthy Supports  Participation Level:  Did Not Attend   Description of Group:  Patients in this group were introduced to the idea of adding a variety of healthy supports to address the various needs in their lives.Patients discussed what additional healthy supports could be helpful in their recovery and wellness after discharge in order to prevent future hospitalizations.   An emphasis was placed on using counselor, doctor, therapy groups, 12-step groups, and problem-specific support groups to expand supports.  They also worked as a group on developing a specific plan for several patients to deal with unhealthy supports through boundary-setting, psychoeducation with loved ones, and even termination of relationships.   Therapeutic Goals:   1)  discuss importance of adding supports to stay well once out of the hospital  2)  compare healthy versus unhealthy supports and identify some examples of each  3)  generate ideas and descriptions of healthy supports that can be added  4)  offer mutual support about how to address unhealthy supports  5)  encourage active participation in and adherence to discharge plan    Summary of Patient Progress:  Did not attend  Therapeutic Modalities:   Motivational Interviewing Brief Solution-Focused Therapy  Evorn Gong

## 2019-04-12 NOTE — Progress Notes (Signed)
   04/12/19 0003  COVID-19 Daily Checkoff  Have you had a fever (temp > 37.80C/100F)  in the past 24 hours?  No  COVID-19 EXPOSURE  Have you traveled outside the state in the past 14 days? No  Have you been in contact with someone with a confirmed diagnosis of COVID-19 or PUI in the past 14 days without wearing appropriate PPE? No  Have you been living in the same home as a person with confirmed diagnosis of COVID-19 or a PUI (household contact)? No  Have you been diagnosed with COVID-19? No   

## 2019-04-12 NOTE — [facility] Group Notes (Signed)
Adult Psychoeducational Group Note  Date:  04/12/2019 Time:  8:59 AM  Group Topic/Focus:  Goals Group:   The focus of this group is to help patients establish daily goals to achieve during treatment and discuss how the patient can incorporate goal setting into their daily lives to aide in recovery.  Participation Level:  Active  Participation Quality:  Appropriate  Affect:  Appropriate  Cognitive:  Appropriate  Insight: Appropriate  Engagement in Group:  Engaged  Modes of Intervention:  Orientation  Additional Comments:  Pt attended and participated in orientation/goals group. Pt goal for today is to continue to be positive and make it through the day.   [name] 04/12/2019, 8:59 AM

## 2019-04-12 NOTE — Progress Notes (Signed)
   04/12/19 0745  COVID-19 Daily Checkoff  Have you had a fever (temp > 37.80C/100F)  in the past 24 hours?  No  If you have had runny nose, nasal congestion, sneezing in the past 24 hours, has it worsened? No  COVID-19 EXPOSURE  Have you traveled outside the state in the past 14 days? No  Have you been in contact with someone with a confirmed diagnosis of COVID-19 or PUI in the past 14 days without wearing appropriate PPE? No  Have you been living in the same home as a person with confirmed diagnosis of COVID-19 or a PUI (household contact)? No  Have you been diagnosed with COVID-19? No

## 2019-04-12 NOTE — Progress Notes (Signed)
[facility] MD Progress Note  04/12/2019 11:12 AM William Gonzalez  MRN:  [id number] Subjective:  Patient reports some improvement compared to admission. States " I guess I am feeling all right". Does describe some lingering depression but acknowledges it is better than on admission.  Currently denies suicidal ideations. Denies medication side effects.  Objective : I have discussed case with treatment team and have met with patient. 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  Patient reports some improvement compared to admission and describes partially improved mood, although describes lingering depression. Denies suicidal ideations today.   Denies medication side effects.  Staff notes indicate patient presents with less irritability/ improving range of affect . Labs reviewed- HgbA1C 7.8, Blood glucose 169, BUN 19, Creat 0.99, TSH 1.466  Principal Problem: Call is him depression post CVA/homelessness Diagnosis: Active Problems:   MDD (major depressive disorder), recurrent severe, without psychosis ([place])  Total Time spent with patient: 15 minutes  Past Medical History:  Past Medical History:  Diagnosis Date  . Alcohol abuse   . Coronary artery disease   . Diabetes mellitus   . Hypertension   . Stented coronary artery   . Tobacco abuse     Past Surgical History:  Procedure Laterality Date  . CORONARY STENT PLACEMENT     Family History:  Family History  Problem Relation Age of Onset  . Hypertension Mother   . Diabetes Mother   . Hypertension Father   . Diabetes Father   . Hypertension Sister   . Hypertension Brother   . Diabetes Sister   . Diabetes Brother     Social History:   Social History   Substance and Sexual Activity  Alcohol Use Yes  . Alcohol/week: 1.0 standard drinks  . Types: 1 Cans of beer per week   Comment: occassionally      Social History   Substance and Sexual Activity  Drug Use Yes  . Types: Cocaine    Social History   Socioeconomic History  . Marital status: Divorced    Spouse name: Not on file  . Number of children: Not on file  . Years of education: Not on file  . Highest education level: Not on file  Occupational History  . Not on file  Social Needs  . Financial resource strain: Not on file  . Food insecurity:    Worry: Not on file    Inability: Not on file  . Transportation needs:    Medical: Not on file    Non-medical: Not on file  Tobacco Use  . Smoking status: Current Every Day Smoker    Packs/day: 0.30    Types: Cigarettes  . Smokeless tobacco: Never Used  Substance and Sexual Activity  . Alcohol use: Yes    Alcohol/week: 1.0 standard drinks    Types: 1 Cans of beer per week    Comment: occassionally   . Drug use: Yes    Types: Cocaine  . Sexual activity: Not Currently  Lifestyle  . Physical activity:    Days per week: Not on file    Minutes per session: Not on file  . Stress: Not on file  Relationships  . Social connections:    Talks on phone: Not  on file    Gets together: Not on file    Attends religious service: Not on file    Active member of club or organization: Not on file    Attends meetings of clubs or organizations: Not on file    Relationship status: Not on file  Other Topics Concern  . Not on file  Social History Narrative  . Not on file   Additional Social History:   Sleep: Fair, states he did not sleep well last night  Appetite:  Good  Current Medications: Current Facility-Administered Medications  Medication Dose Route Frequency Provider Last Rate Last Dose  . acetaminophen (TYLENOL) tablet 650 mg  650 mg Oral Q4H PRN Ethelene Hal, NP   650 mg at 04/11/19 1947  .  alum & mag hydroxide-simeth (MAALOX/MYLANTA) 200-200-20 MG/5ML suspension 30 mL  30 mL Oral Q6H PRN Ethelene Hal, NP      . amantadine (SYMMETREL) capsule 100 mg  100 mg Oral BID Johnn Hai, MD   100 mg at 04/12/19 0745  . amLODipine (NORVASC) tablet 10 mg  10 mg Oral Daily Ethelene Hal, NP   10 mg at 04/12/19 0745  . atorvastatin (LIPITOR) tablet 40 mg  40 mg Oral q1800 Ethelene Hal, NP   40 mg at 04/10/19 1726  . chlordiazePOXIDE (LIBRIUM) capsule 25 mg  25 mg Oral Celine Ahr, MD   25 mg at 04/11/19 2111   Followed by  . [START ON 04/13/2019] chlordiazePOXIDE (LIBRIUM) capsule 25 mg  25 mg Oral Daily Johnn Hai, MD      . clopidogrel (PLAVIX) tablet 75 mg  75 mg Oral Daily Ethelene Hal, NP   75 mg at 04/12/19 0745  . glipiZIDE (GLUCOTROL) tablet 10 mg  10 mg Oral BID AC Ethelene Hal, NP   10 mg at 04/12/19 8144  . latanoprost (XALATAN) 0.005 % ophthalmic solution 1 drop  1 drop Both Eyes QHS Ethelene Hal, NP   1 drop at 04/11/19 2111  . lisinopril (PRINIVIL,ZESTRIL) tablet 20 mg  20 mg Oral Daily Ethelene Hal, NP   20 mg at 04/12/19 0745  . magnesium hydroxide (MILK OF MAGNESIA) suspension 30 mL  30 mL Oral Daily PRN Ethelene Hal, NP      . metFORMIN (GLUCOPHAGE) tablet 1,000 mg  1,000 mg Oral BID WC Ethelene Hal, NP   1,000 mg at 04/12/19 0745  . metoprolol succinate (TOPROL-XL) 24 hr tablet 50 mg  50 mg Oral Daily Johnn Hai, MD   50 mg at 04/12/19 0745  . nicotine (NICODERM CQ - dosed in mg/24 hours) patch 21 mg  21 mg Transdermal Daily Ethelene Hal, NP   21 mg at 04/12/19 0744  . ondansetron (ZOFRAN) tablet 4 mg  4 mg Oral Q8H PRN Ethelene Hal, NP      . pantoprazole (PROTONIX) EC tablet 40 mg  40 mg Oral Daily Ethelene Hal, NP   40 mg at 04/12/19 0745  . terbinafine (LAMISIL) 1 % cream   Topical BID Lindon Romp A, NP      . vortioxetine HBr (TRINTELLIX) tablet 10 mg   10 mg Oral Daily Johnn Hai, MD   10 mg at 04/12/19 0745    Lab Results:  Results for orders placed or performed during the hospital encounter of 04/08/19 (from the past 48 hour(s))  TSH     Status: None   Collection Time: 04/12/19  6:19 AM  Result Value Ref Range   TSH 1.466 0.350 - 4.500 uIU/mL    Comment: Performed by a 3rd Generation assay with a functional sensitivity of <=0.01 uIU/mL. Performed at [facility], [place] [address], [place], [place] [address]   Lipid panel     Status: Abnormal   Collection Time: 04/12/19  6:19 AM  Result Value Ref Range   Cholesterol 143 0 - 200 mg/dL   Triglycerides 69 <150 mg/dL   HDL 38 (L) >40 mg/dL   Total CHOL/HDL Ratio 3.8 RATIO   VLDL 14 0 - 40 mg/dL   LDL Cholesterol 91 0 - 99 mg/dL    Comment:        Total Cholesterol/HDL:CHD Risk Coronary Heart Disease Risk Table                     Men   Women  1/2 Average Risk   3.4   3.3  Average Risk       5.0   4.4  2 X Average Risk   9.6   7.1  3 X Average Risk  23.4   11.0        Use the calculated Patient Ratio above and the CHD Risk Table to determine the patient's CHD Risk.        ATP III CLASSIFICATION (LDL):  <100     mg/dL   Optimal  100-129  mg/dL   Near or Above                    Optimal  130-159  mg/dL   Borderline  160-189  mg/dL   High  >190     mg/dL   Very High Performed at [facility] [address] [place], [place] [address]   Hemoglobin A1c     Status: Abnormal   Collection Time: 04/12/19  6:19 AM  Result Value Ref Range   Hgb A1c MFr Bld 7.8 (H) 4.8 - 5.6 %    Comment: (NOTE) Pre diabetes:          5.7%-6.4% Diabetes:              >6.4% Glycemic control for   <7.0% adults with diabetes    Mean Plasma Glucose 177.16 mg/dL    Comment: Performed at [facility] [address] [place], [place] [address]  Basic metabolic panel     Status: Abnormal   Collection Time: 04/12/19  6:19 AM  Result Value Ref Range    Sodium 139 135 - 145 mmol/L   Potassium 4.1 3.5 - 5.1 mmol/L   Chloride 110 98 - 111 mmol/L   CO2 21 (L) 22 - 32 mmol/L   Glucose, Bld 169 (H) 70 - 99 mg/dL   BUN 19 8 - 23 mg/dL   Creatinine, Ser 0.99 0.61 - 1.24 mg/dL   Calcium 9.5 8.9 - 10.3 mg/dL   GFR calc non Af Amer >60 >60 mL/min   GFR calc Af Amer >60 >60 mL/min   Anion gap 8 5 - 15    Comment: Performed at [facility], [place] [address], [place],  [address]    Blood Alcohol level:  Lab Results  Component Value Date   [facility] <10 04/08/2019   ETH <10 40/09/2724    Metabolic Disorder Labs: Lab Results  Component Value Date   HGBA1C 7.8 (H) 04/12/2019   MPG 177.16 04/12/2019   MPG 157.07 04/19/2018   No  results found for: PROLACTIN Lab Results  Component Value Date   CHOL 143 04/12/2019   TRIG 69 04/12/2019   HDL 38 (L) 04/12/2019   CHOLHDL 3.8 04/12/2019   VLDL 14 04/12/2019   LDLCALC 91 04/12/2019   LDLCALC 71 04/19/2018    Physical Findings: AIMS: Facial and Oral Movements Muscles of Facial Expression: None, normal Lips and Perioral Area: None, normal Jaw: None, normal Tongue: None, normal,Extremity Movements Upper (arms, wrists, hands, fingers): None, normal Lower (legs, knees, ankles, toes): None, normal, Trunk Movements Neck, shoulders, hips: None, normal, Overall Severity Severity of abnormal movements (highest score from questions above): None, normal Incapacitation due to abnormal movements: None, normal Patient's awareness of abnormal movements (rate only patient's report): No Awareness, Dental Status Current problems with teeth and/or dentures?: No Does patient usually wear dentures?: No  CIWA:  CIWA-Ar Total: 0 COWS:  COWS Total Score: 0  Musculoskeletal: Strength & Muscle Tone: decreased Gait & Station: unsteady- mobilizes with a walker  Patient leans: Front and N/A  Psychiatric Specialty Exam: Physical Exam  ROS denies chest pain or shortness of breath, no  coughing, no fever , reports history of lower back pain and shoulder " rotator cuff" pain with certain movements  Blood pressure 140/62, pulse 64, temperature 97.9 F (36.6 C), resp. rate 18, height 5' 6"  (1.676 m), weight 72.6 kg.Body mass index is 25.82 kg/m.  General Appearance: Fairly Groomed  Eye Contact:  improving eye contact   Speech:  improving   Volume:  Normal  Mood:  describes partially improving mood   Affect:  noted to be more reactive, smiles at times appropriately  Thought Process:  Linear and Descriptions of Associations: Intact  Orientation:  Other:  fully alert and attentive  Thought Content:  no hallucinations, no delusions, does not present internally preoccupied at this time  Suicidal Thoughts:  No no current suicidal or self injurious ideations, contracts for safety on unit  Homicidal Thoughts:  No at this time denies homicidal ideations  Memory:  recent and remote fair   Judgement:  Fair  Insight:  Fair  Psychomotor Activity:  improving   Concentration:  Concentration: improving  and Attention Span: improving   Recall:  [employer] of Knowledge:  Fair  Language:  Fair  Akathisia:  Negative  Handed:  Right  AIMS (if indicated):     Assets: [employer] benefits  ADL's:  Intact  Cognition:  WNL  Sleep:  Number of Hours: 2.25   Assessment :  63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  Patient describes partially improved mood and does present with a more reactive affect . Denies SI at this time. Not currently presenting with significant symptoms of alcohol WDL and vitals are currently stable. Thus far tolerating medications well and denies side effects.   Treatment Plan Summary: Treatment Plan  reviewed as below today 4/19. Encourage group and milieu participation to work on Radiographer, therapeutic and symptom reduction Encourage efforts to work on sobriety and relapse prevention Treatment team working on disposition planning options Continue Trintellix 10 mgrs QDAY for depression Continue Glucophage and Glipizide for DM Continue Toprol XL,Norvasc , Lisinopril for HTN Continue Plavix for history of CAD  Continue Lipitor for Hypercholesterolemia Completing Librium detox protocol for alcohol  [place], MD 04/12/2019, 11:12 AM   Patient ID: William Gonzalez, male   DOB: October 17, 1956, 63 y.o.   MRN: [id number]

## 2019-04-12 NOTE — Plan of Care (Addendum)
D: Patient presents less irritable today. He took his meds, and was less oppositional. He reports that he did not sleep well last night. He refused medication to help with sleep. Patient denies SI/HI/AVH. He is unstable on his feet, despite using a rolling walker. His appetite is good, energy low, and concentration poor. He rates his depression 6/10, hopelessness and anxiety 5/10. He denies withdrawal symptoms. Goal: work on "myself". A: Patient checked q15 min, and checks reviewed. Reviewed medication changes with patient and educated on side effects. Educated patient on importance of attending group therapy sessions and educated on several coping skills. Encouarged participation in milieu through recreation therapy and attending meals with peers. Support and encouragement provided. Fluids offered. R: Patient receptive to education on medications, and is medication compliant. Patient contracts for safety on the unit.  Problem: Education: Goal: Emotional status will improve Outcome: Not Progressing Goal: Mental status will improve Outcome: Not Progressing   Problem: Activity: Goal: Interest or engagement in activities will improve Outcome: Not Progressing Goal: Sleeping patterns will improve Outcome: Not Progressing

## 2019-04-13 DIAGNOSIS — F1020 Alcohol dependence, uncomplicated: Secondary | ICD-10-CM

## 2019-04-13 MED ORDER — VORTIOXETINE HBR 10 MG PO TABS: 10.0000 mg | ORAL_TABLET | Freq: Every day | ORAL | 0 refills | Status: AC

## 2019-04-13 MED ORDER — AMANTADINE HCL 100 MG PO CAPS: 100.0000 mg | ORAL_CAPSULE | Freq: Two times a day (BID) | ORAL | 0 refills | Status: AC

## 2019-04-13 MED ORDER — NICOTINE 21 MG/24HR TD PT24: 21.0000 mg | MEDICATED_PATCH | Freq: Every day | TRANSDERMAL | 0 refills | Status: AC

## 2019-04-13 MED ORDER — METOPROLOL SUCCINATE ER 50 MG PO TB24: 50.0000 mg | ORAL_TABLET | Freq: Every day | ORAL | 0 refills | Status: DC

## 2019-04-13 NOTE — Discharge Summary (Signed)
Physician Discharge Summary Note  Patient:  William Gonzalez is an 63 y.o., male MRN:  [id number] DOB:  06/25/1956 Patient phone:  [phone] (home)  Patient address:   [address] [place] [address],  Total Time spent with patient: 15 minutes  Date of Admission:  04/08/2019 Date of Discharge: 04/13/19  Reason for Admission:  Suicidal and homicidal thoughts, alcohol dependence  Principal Problem: MDD (major depressive disorder), recurrent severe, without psychosis (HCC) Discharge Diagnoses: Principal Problem:   MDD (major depressive disorder), recurrent severe, without psychosis (HCC) Active Problems:   Cocaine abuse (HCC)   Alcohol dependence (HCC)   Past Psychiatric History: History of depression, cocaine, and alcohol abuse. Suicide attempt via overdose on medications in 2019- hospitalized at [facility] at that time and discharged on Prozac, which patient says was not effective. Seen outpatient at the [facility] clinic.  Past Medical History:  Past Medical History:  Diagnosis Date  . Alcohol abuse   . Coronary artery disease   . Diabetes mellitus   . Hypertension   . Stented coronary artery   . Tobacco abuse     Past Surgical History:  Procedure Laterality Date  . CORONARY STENT PLACEMENT     Family History:  Family History  Problem Relation Age of Onset  . Hypertension Mother   . Diabetes Mother   . Hypertension Father   . Diabetes Father   . Hypertension Sister   . Hypertension Brother   . Diabetes Sister   . Diabetes Brother    Family Psychiatric  History: Denies Social History:  Social History   Substance and Sexual Activity  Alcohol Use Yes  . Alcohol/week: 1.0 standard drinks  . Types: 1 Cans of beer per week   Comment: occassionally      Social History   Substance and Sexual Activity  Drug Use Yes  . Types: Cocaine    Social History   Socioeconomic History  . Marital status: Divorced    Spouse name: Not on file  . Number of children: Not  on file  . Years of education: Not on file  . Highest education level: Not on file  Occupational History  . Not on file  Social Needs  . Financial resource strain: Not on file  . Food insecurity:    Worry: Not on file    Inability: Not on file  . Transportation needs:    Medical: Not on file    Non-medical: Not on file  Tobacco Use  . Smoking status: Current Every Day Smoker    Packs/day: 0.30    Types: Cigarettes  . Smokeless tobacco: Never Used  Substance and Sexual Activity  . Alcohol use: Yes    Alcohol/week: 1.0 standard drinks    Types: 1 Cans of beer per week    Comment: occassionally   . Drug use: Yes    Types: Cocaine  . Sexual activity: Not Currently  Lifestyle  . Physical activity:    Days per week: Not on file    Minutes per session: Not on file  . Stress: Not on file  Relationships  . Social connections:    Talks on phone: Not on file    Gets together: Not on file    Attends religious service: Not on file    Active member of club or organization: Not on file    Attends meetings of clubs or organizations: Not on file    Relationship status: Not on file  Other Topics Concern  .  Not on file  Social History Narrative  . 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 He came to our attention as a walk-in patient after he had phoned the [facility] hotline reporting suicidal thoughts homicidal thoughts towards no one in particular, and drinking at least 10 beers daily.  Drug screen also reflected cocaine abuse. He reported a recent fall at home and has taken blood thinners therefore he was medically screened and deemed medically stable prior to transfer here. He elaborated that he did overdose on medications approximately 1 year  ago and was treated [facility] [place] and he [facility] but does not believe his antidepressants or "what ever" they were giving him or particularly helpful and is noncompliant fully. Stated his cocaine use is only about once a month, sleep is variable appetite fair and he has no access to weapons. He certainly cordial on my exam. Alert oriented to person place time day and situation appropriate eye contact at intervals denies wanting to harm self at this point and can contract for safety while here denies wanting to harm others states he is staying with a family member but wants to get out of there because of the drinking and drug and going on there so there clearly some issues of secondary gain going on.  William Gonzalez was admitted for reported suicidal ideation and homicidal ideation toward no particular person. He reported drinking at least 10 beers daily, but admission BAL <10. CIWA protocol was ordered, and patient consistently had CIWA scores of 0. He has well-documented history of cocaine abuse, and his UDS here was positive for cocaine. He was started on Trintellix. Medications for diabetes and HTN were continued, and metoprolol was added. Amantadine was started for cocaine cravings. He participated in group therapy on the unit. He responded well to treatment with no adverse effects reported. [facility]. He stabilized with medication and therapy. He was discharged on the medications listed below. He has shown improvement with improved mood, affect, sleep, appetite, and interaction. He denies any SI/HI/AVH and contracts for safety. [facility] (see below). He is provided with prescriptions for medications upon discharge. He is discharging home via bus to his cousin's home.  Physical Findings: AIMS: Facial and Oral Movements Muscles of Facial Expression: None, normal Lips and  Perioral Area: None, normal Jaw: None, normal Tongue: None, normal,Extremity Movements Upper (arms, wrists, hands, fingers): None, normal Lower (legs, knees, ankles, toes): None, normal, Trunk Movements Neck, shoulders, hips: None, normal, Overall Severity Severity of abnormal movements (highest score from questions above): None, normal Incapacitation due to abnormal movements: None, normal Patient's awareness of abnormal movements (rate only patient's report): No Awareness, Dental Status Current problems with teeth and/or dentures?: No Does patient usually wear dentures?: No  CIWA:  CIWA-Ar Total: 0 COWS:  COWS Total Score: 0  Musculoskeletal: Strength & Muscle Tone: left sided weakness s/p CVA Gait & Station: ambulates well with walker Patient leans: N/A  Psychiatric Specialty Exam: Physical Exam  Nursing note and vitals reviewed. Constitutional: He is oriented to person, place, and time. He appears well-developed and well-nourished.  Cardiovascular:  Normal rate.  Respiratory: Effort normal.  Neurological: He is alert and oriented to person, place, and time.  Psychiatric: His behavior is normal.    Review of Systems  Constitutional: Negative.   Respiratory: Negative.   Cardiovascular: Negative.   Psychiatric/Behavioral: Positive for depression (improving) and substance abuse (cocaine, ETOH). Negative for hallucinations and suicidal ideas. The patient is not nervous/anxious and does not have insomnia.     Blood pressure 140/75, pulse 71, temperature 97.9 F (36.6 C), resp. rate 18, height  (1.676 m), weight 72.6 kg.Body mass index is 25.82 kg/m.  General Appearance: Fairly Groomed  Eye Contact:  Fair  Speech:  Normal Rate  Volume:  Normal  Mood:  Euthymic  Affect:  Appropriate and Congruent  Thought Process:  Coherent  Orientation:  Full (Time, Place, and Person)  Thought Content:  WDL  Suicidal Thoughts:  No  Homicidal Thoughts:  No  Memory:  Immediate;    Fair Recent;   Fair  Judgement:  Intact  Insight:  Fair  Psychomotor Activity:  Normal  Concentration:  Concentration: Fair  Recall:  [employer] of Knowledge:  Fair  Language:  Good  Akathisia:  No  Handed:  Right  AIMS (if indicated):     Assets:  Architect Leisure Time Resilience  ADL's:  Intact  Cognition:  WNL  Sleep:  Number of Hours: 6     Have you used any form of tobacco in the last 30 days? (Cigarettes, Smokeless Tobacco, Cigars, and/or Pipes): Yes  Has this patient used any form of tobacco in the last 30 days? (Cigarettes, Smokeless Tobacco, Cigars, and/or Pipes) Yes, a prescription for an FDA-approved medication for tobacco cessation was offered at discharge.   Blood Alcohol level:  Lab Results  Component Value Date   ETH <10 04/08/2019   ETH <10 10/09/2018    Metabolic Disorder Labs:  Lab Results  Component Value Date   HGBA1C 7.8 (H) 04/12/2019   MPG 177.16 04/12/2019   MPG 157.07 04/19/2018   No results found for: PROLACTIN Lab Results  Component Value Date   CHOL 143 04/12/2019   TRIG 69 04/12/2019   HDL 38 (L) 04/12/2019   CHOLHDL 3.8 04/12/2019   VLDL 14 04/12/2019   LDLCALC 91 04/12/2019   LDLCALC 71 04/19/2018    See Psychiatric Specialty Exam and Suicide Risk Assessment completed by Attending Physician prior to discharge.  Discharge destination:  Home  Is patient on multiple antipsychotic therapies at discharge:  No   Has Patient had three or more failed trials of antipsychotic monotherapy by history:  No  Recommended Plan for Multiple Antipsychotic Therapies: NA  Discharge Instructions    Discharge instructions   Complete by:  As directed    Patient is instructed to take all prescribed medications as recommended. Report any side effects or adverse reactions to your outpatient psychiatrist. Patient is instructed to abstain from alcohol and illegal drugs while on prescription medications. In  the event of worsening symptoms, patient is instructed to call the crisis hotline, 911, or go to the nearest emergency department for evaluation and treatment.     Allergies as of 04/13/2019      Reactions   Iodine Anaphylaxis, Swelling   Swells all over      Medication List    STOP taking these medications   acetaminophen 500 MG tablet Commonly known as:  TYLENOL   Blue-Emu Super Strength Crea   hydrocortisone cream 1 %   ibuprofen  200 MG tablet Commonly known as:  ADVIL   IRON PO   sertraline 100 MG tablet Commonly known as:  ZOLOFT     TAKE these medications     Indication  amantadine 100 MG capsule Commonly known as:  SYMMETREL Take 1 capsule (100 mg total) by mouth 2 (two) times daily. For cravings  Indication:  Cocaine cravings   amLODipine 10 MG tablet Commonly known as:  NORVASC Take 1 tablet (10 mg total) by mouth daily.  Indication:  High Blood Pressure Disorder   atorvastatin 40 MG tablet Commonly known as:  LIPITOR Take 1 tablet (40 mg total) by mouth daily at 6 PM.  Indication:  High Amount of Fats in the Blood   clopidogrel 75 MG tablet Commonly known as:  PLAVIX Take 1 tablet (75 mg total) by mouth daily.  Indication:  Antiplatelet   glipiZIDE 10 MG tablet Commonly known as:  GLUCOTROL Take 10 mg by mouth 2 (two) times daily before a meal.  Indication:  Type 2 Diabetes   latanoprost 0.005 % ophthalmic solution Commonly known as:  XALATAN Place 1 drop into both eyes at bedtime.  Indication:  Wide-Angle Glaucoma   lisinopril 20 MG tablet Commonly known as:  ZESTRIL Take 1 tablet (20 mg total) by mouth daily.  Indication:  High Blood Pressure Disorder   metFORMIN 1000 MG tablet Commonly known as:  GLUCOPHAGE Take 1 tablet (1,000 mg total) by mouth 2 (two) times daily with a meal.  Indication:  Type 2 Diabetes   metoprolol succinate 50 MG 24 hr tablet Commonly known as:  TOPROL-XL Take 1 tablet (50 mg total) by mouth daily. Take with  or immediately following a meal for high blood pressure Start taking on:  April 14, 2019  Indication:  High Blood Pressure Disorder   nicotine 21 mg/24hr patch Commonly known as:  NICODERM CQ - dosed in mg/24 hours Place 1 patch (21 mg total) onto the skin daily. For smoking cessation Start taking on:  April 14, 2019  Indication:  Nicotine Addiction   omeprazole 20 MG capsule Commonly known as:  PRILOSEC Take 20 mg by mouth daily.  Indication:  Gastroesophageal Reflux Disease   vortioxetine HBr 10 MG Tabs tablet Commonly known as:  TRINTELLIX Take 1 tablet (10 mg total) by mouth daily. For mood Start taking on:  April 14, 2019  Indication:  Mood      Follow-up Information    [facility] on 04/14/2019.   Why:  Medication management appointment with [facility] is Tuesday, 4/21 at 8:30a.  Psychopharmacologist appointment is Tuesday, 4/21 at 9:30a.  The appointments will be held virtually and the providers will contact the patient.   Contact information: [address] [facility] [place] [address] [phone]           Follow-up recommendations: Activity as tolerated. Diet as recommended by primary care physician. Keep all scheduled follow-up appointments as recommended.   Comments:   Patient is instructed to take all prescribed medications as recommended. Report any side effects or adverse reactions to your outpatient psychiatrist. Patient is instructed to abstain from alcohol and illegal drugs while on prescription medications. In the event of worsening symptoms, patient is instructed to call the crisis hotline, 911, or go to the nearest emergency department for evaluation and treatment.  Signed: Aldean Baker, NP 04/13/2019, 10:58 AM

## 2019-04-13 NOTE — BHH Suicide Risk Assessment (Signed)
[facility] Discharge Suicide Risk Assessment   Principal Problem: Homelessness, depression, reported alcoholism Discharge Diagnoses: Active Problems:   MDD (major depressive disorder), recurrent severe, without psychosis (HCC)   Total Time spent with patient: 20 minutes Currently is alert oriented fully generally cooperative affect appropriate for the situation no thoughts of harming self or others contracting, no psychosis no cravings tremors or withdrawal  Mental Status Per Nursing Assessment::   On Admission:  Self-harm thoughts  Demographic Factors:  Low socioeconomic status  Loss Factors: Decrease in vocational status and Decline in physical health  Historical Factors: NA  Risk Reduction Factors:   NA  Continued Clinical Symptoms:  Alcohol/Substance Abuse/Dependencies  Cognitive Features That Contribute To Risk:  Closed-mindedness    Suicide Risk:  Minimal: No identifiable suicidal ideation.  Patients presenting with no risk factors but with morbid ruminations; may be classified as minimal risk based on the severity of the depressive symptoms  Follow-up Information    [facility] Follow up on 04/14/2019.   Why:  Medication management appointment with M[place] is Tuesday, 4/21 at 8:30a.  Psychopharmacologist appointment is Tuesday, 4/21 at 9:30a.  The appointments will be held virtually and the providers will contact the patient.   Contact information: [address] [facility] [place] [address] [phone]           Plan Of Care/Follow-up recommendations:  Activity:  full  Saahil Herbster, MD 04/13/2019, 9:43 AM

## 2019-04-13 NOTE — Progress Notes (Signed)
Spiritual care group on grief and loss facilitated by chaplain Burnis Kingfisher   Group opened with brief discussion and psycho-social ed around grief and loss in relationships and in relation to self - identifying life patterns, circumstances, changes that cause losses. Established group norm of speaking from own life experience. Group goal of establishing open and affirming space for members to share loss and experience with grief, normalize grief experience and provide psycho social education and grief support.    PT was invited.  Did not attend group.

## 2019-04-13 NOTE — Progress Notes (Signed)
  [facility] Adult Case Management Discharge Plan :  Will you be returning to the same living situation after discharge:  Yes,  reports he will try to stay with his cousin again. Shelter resources on chart for patient to review as well. At discharge, do you have transportation home?: Yes,  bus Do you have the ability to pay for your medications: Yes,  Medicare  Release of information consent forms completed and in the chart.  Patient to Follow up at: Follow-up Information    [facility] Follow up on 04/14/2019.   Why:  Medication management appointment with M[place] is Tuesday, 4/21 at 8:30a.  Psychopharmacologist appointment is Tuesday, 4/21 at 9:30a.  The appointments will be held virtually and the providers will contact the patient.   Contact information: [address] [facility] [place] [address] [phone]           Next level of care provider has access to [facility] Link:no  Safety Planning and Suicide Prevention discussed: Yes,  declined consents. SPE pamphlet on chart to share with supports.  Have you used any form of tobacco in the last 30 days? (Cigarettes, Smokeless Tobacco, Cigars, and/or Pipes): Yes  Has patient been referred to the Quitline?: Patient refused referral  Patient has been referred for addiction treatment: Yes  Darreld Mclean, LCSWA 04/13/2019, 10:20 AM

## 2019-04-13 NOTE — Progress Notes (Signed)
Pt discharged to lobby. Pt was stable and appreciative at that time. All papers and prescriptions were given and valuables returned. Verbal understanding expressed. Denies SI/HI and A/VH. Pt given opportunity to express concerns and ask questions.  

## 2019-04-13 NOTE — BHH Group Notes (Signed)
Adult Psychoeducational Nursing Group Note  Date:  04/13/2019 Time:  4:00 PM  Group Topic/Focus: Emotional First Aid Emotional Education:   The focus of this group is to discuss what feelings/emotions are, and how they are experienced, and how to cope.  Participation Level:  Did Not Attend  Additional Comments:  Patient was invited but declined to attend group.  Nhan Qualley A Andrian Urbach 04/13/2019, 5:00 PM 

## 2019-04-13 NOTE — Progress Notes (Signed)
Recreation Therapy Notes  Date:  4.20.20 Time: 0930 Location: [address] Dayroom  Group Topic: Stress Management  Goal Area(s) Addresses:  Patient will identify positive stress management techniques. Patient will identify benefits of using stress management post d/c.  Intervention: Stress Management  Activity :  Meditation.  LRT introduced the stress management technique of meditation.  LRT played a meditation that focused on making the most of the day and the possibilities that are available.  Patients were to listen and follow along as meditation was played to engage in activity.  Education: Stress Management, Discharge Planning.   Education Outcome: Acknowledges Education  Clinical Observations/Feedback: Pt did not attend group.     Caroll Rancher, LRT/CTRS         Lillia Abed, Emmaleigh Longo A 04/13/2019 11:12 AM

## 2019-04-20 ENCOUNTER — Emergency Department (HOSPITAL_COMMUNITY)
Admission: EM | Admit: 2019-04-20 | Discharge: 2019-04-22 | Disposition: A | Discharge status: Home / Self Care | Payer: Medicare Other | Attending: Emergency Medicine | Admitting: Emergency Medicine

## 2019-04-20 ENCOUNTER — Encounter (HOSPITAL_COMMUNITY): Payer: Self-pay

## 2019-04-20 ENCOUNTER — Other Ambulatory Visit: Payer: Self-pay

## 2019-04-20 ENCOUNTER — Emergency Department (HOSPITAL_COMMUNITY): Payer: Medicare Other

## 2019-04-20 DIAGNOSIS — Z7902 Long term (current) use of antithrombotics/antiplatelets: Secondary | ICD-10-CM | POA: Insufficient documentation

## 2019-04-20 DIAGNOSIS — I10 Essential (primary) hypertension: Secondary | ICD-10-CM | POA: Insufficient documentation

## 2019-04-20 DIAGNOSIS — Z8673 Personal history of transient ischemic attack (TIA), and cerebral infarction without residual deficits: Secondary | ICD-10-CM | POA: Insufficient documentation

## 2019-04-20 DIAGNOSIS — R42 Dizziness and giddiness: Secondary | ICD-10-CM | POA: Diagnosis present

## 2019-04-20 DIAGNOSIS — Z7984 Long term (current) use of oral hypoglycemic drugs: Secondary | ICD-10-CM | POA: Insufficient documentation

## 2019-04-20 DIAGNOSIS — R531 Weakness: Secondary | ICD-10-CM | POA: Diagnosis not present

## 2019-04-20 DIAGNOSIS — M25511 Pain in right shoulder: Secondary | ICD-10-CM | POA: Insufficient documentation

## 2019-04-20 DIAGNOSIS — E119 Type 2 diabetes mellitus without complications: Secondary | ICD-10-CM | POA: Insufficient documentation

## 2019-04-20 DIAGNOSIS — F1420 Cocaine dependence, uncomplicated: Secondary | ICD-10-CM | POA: Insufficient documentation

## 2019-04-20 DIAGNOSIS — F329 Major depressive disorder, single episode, unspecified: Secondary | ICD-10-CM | POA: Diagnosis not present

## 2019-04-20 DIAGNOSIS — I2510 Atherosclerotic heart disease of native coronary artery without angina pectoris: Secondary | ICD-10-CM | POA: Diagnosis not present

## 2019-04-20 DIAGNOSIS — F332 Major depressive disorder, recurrent severe without psychotic features: Secondary | ICD-10-CM | POA: Diagnosis not present

## 2019-04-20 DIAGNOSIS — F1024 Alcohol dependence with alcohol-induced mood disorder: Secondary | ICD-10-CM | POA: Diagnosis not present

## 2019-04-20 DIAGNOSIS — F339 Major depressive disorder, recurrent, unspecified: Secondary | ICD-10-CM

## 2019-04-20 DIAGNOSIS — Z79899 Other long term (current) drug therapy: Secondary | ICD-10-CM | POA: Diagnosis not present

## 2019-04-20 DIAGNOSIS — F1020 Alcohol dependence, uncomplicated: Secondary | ICD-10-CM | POA: Diagnosis not present

## 2019-04-20 DIAGNOSIS — F17210 Nicotine dependence, cigarettes, uncomplicated: Secondary | ICD-10-CM | POA: Diagnosis not present

## 2019-04-20 DIAGNOSIS — M25519 Pain in unspecified shoulder: Secondary | ICD-10-CM

## 2019-04-20 DIAGNOSIS — R45851 Suicidal ideations: Secondary | ICD-10-CM | POA: Diagnosis not present

## 2019-04-20 LAB — BASIC METABOLIC PANEL
Anion gap: 10 (ref 5–15)
BUN: 15 mg/dL (ref 8–23)
CO2: 24 mmol/L (ref 22–32)
Calcium: 10.1 mg/dL (ref 8.9–10.3)
Chloride: 103 mmol/L (ref 98–111)
Creatinine, Ser: 1.13 mg/dL (ref 0.61–1.24)
GFR calc Af Amer: >60 mL/min (ref >60)
GFR calc non Af Amer: >60 mL/min (ref >60)
Glucose, Bld: 186 mg/dL — ABNORMAL HIGH (ref 70–99)
Potassium: 4.8 mmol/L (ref 3.5–5.1)
Sodium: 137 mmol/L (ref 135–145)

## 2019-04-20 LAB — URINALYSIS, ROUTINE W REFLEX MICROSCOPIC
Bilirubin Urine: NEGATIVE
Glucose, UA: 50 mg/dL — AB
Hgb urine dipstick: NEGATIVE
Ketones, ur: NEGATIVE mg/dL
Leukocytes,Ua: NEGATIVE
Nitrite: NEGATIVE
Protein, ur: NEGATIVE mg/dL
Specific Gravity, Urine: 1.016 (ref 1.005–1.030)
pH: 5 (ref 5.0–8.0)

## 2019-04-20 LAB — RAPID URINE DRUG SCREEN, HOSP PERFORMED
Amphetamines: NOT DETECTED
Barbiturates: NOT DETECTED
Benzodiazepines: POSITIVE — AB
Cocaine: POSITIVE — AB
Opiates: NOT DETECTED
Tetrahydrocannabinol: NOT DETECTED

## 2019-04-20 LAB — CBC WITH DIFFERENTIAL/PLATELET
Abs Immature Granulocytes: 0.05 10*3/uL (ref 0.00–0.07)
Basophils Absolute: 0 10*3/uL (ref 0.0–0.1)
Basophils Relative: 0 %
Eosinophils Absolute: 0.3 10*3/uL (ref 0.0–0.5)
Eosinophils Relative: 2 %
HCT: 36.5 % — ABNORMAL LOW (ref 39.0–52.0)
Hemoglobin: 12.3 g/dL — ABNORMAL LOW (ref 13.0–17.0)
Immature Granulocytes: 0 %
Lymphocytes Relative: 13 %
Lymphs Abs: 2 10*3/uL (ref 0.7–4.0)
MCH: 33.2 pg (ref 26.0–34.0)
MCHC: 33.7 g/dL (ref 30.0–36.0)
MCV: 98.4 fL (ref 80.0–100.0)
Monocytes Absolute: 0.9 10*3/uL (ref 0.1–1.0)
Monocytes Relative: 5 %
Neutro Abs: 12.7 10*3/uL — ABNORMAL HIGH (ref 1.7–7.7)
Neutrophils Relative %: 80 %
Platelets: 329 10*3/uL (ref 150–400)
RBC: 3.71 MIL/uL — ABNORMAL LOW (ref 4.22–5.81)
RDW: 12 % (ref 11.5–15.5)
WBC: 15.9 10*3/uL — ABNORMAL HIGH (ref 4.0–10.5)
nRBC: 0 % (ref 0.0–0.2)

## 2019-04-20 LAB — ETHANOL: Alcohol, Ethyl (B): <10 mg/dL (ref <10)

## 2019-04-20 LAB — ACETAMINOPHEN LEVEL: Acetaminophen (Tylenol), Serum: <10 ug/mL — ABNORMAL LOW (ref 10–30)

## 2019-04-20 LAB — SALICYLATE LEVEL: Salicylate Lvl: <7 mg/dL (ref 2.8–30.0)

## 2019-04-20 MED ORDER — GLIPIZIDE 10 MG PO TABS
10.0000 mg | ORAL_TABLET | Freq: Two times a day (BID) | ORAL | Status: DC
  Administered 2019-04-21 – 2019-04-22 (×3): 10 mg via ORAL
  Filled 2019-04-20 (×5): qty 1

## 2019-04-20 MED ORDER — ATORVASTATIN CALCIUM 40 MG PO TABS
40.0000 mg | ORAL_TABLET | Freq: Every day | ORAL | Status: DC
  Administered 2019-04-21: 40 mg via ORAL
  Filled 2019-04-20: qty 1

## 2019-04-20 MED ORDER — VORTIOXETINE HBR 10 MG PO TABS
10.0000 mg | ORAL_TABLET | Freq: Every day | ORAL | Status: DC
  Administered 2019-04-21 – 2019-04-22 (×2): 10 mg via ORAL
  Filled 2019-04-20 (×3): qty 1

## 2019-04-20 MED ORDER — CLOPIDOGREL BISULFATE 75 MG PO TABS
75.0000 mg | ORAL_TABLET | Freq: Every day | ORAL | Status: DC
  Administered 2019-04-21 – 2019-04-22 (×2): 75 mg via ORAL
  Filled 2019-04-20 (×2): qty 1

## 2019-04-20 MED ORDER — AMANTADINE HCL 100 MG PO CAPS
100.0000 mg | ORAL_CAPSULE | Freq: Two times a day (BID) | ORAL | Status: DC
  Administered 2019-04-20 – 2019-04-22 (×4): 100 mg via ORAL
  Filled 2019-04-20 (×6): qty 1

## 2019-04-20 MED ORDER — METFORMIN HCL 500 MG PO TABS
1000.0000 mg | ORAL_TABLET | Freq: Two times a day (BID) | ORAL | Status: DC
  Administered 2019-04-21 – 2019-04-22 (×3): 1000 mg via ORAL
  Filled 2019-04-20 (×3): qty 2

## 2019-04-20 MED ORDER — LORAZEPAM 1 MG PO TABS
1.0000 mg | ORAL_TABLET | Freq: Once | ORAL | Status: AC | PRN
  Administered 2019-04-20: 1 mg via ORAL
  Filled 2019-04-20: qty 1

## 2019-04-20 MED ORDER — ACETAMINOPHEN 325 MG PO TABS
650.0000 mg | ORAL_TABLET | ORAL | Status: DC | PRN
  Administered 2019-04-21 – 2019-04-22 (×2): 650 mg via ORAL
  Filled 2019-04-20 (×2): qty 2

## 2019-04-20 MED ORDER — DULOXETINE HCL 60 MG PO CPEP
60.0000 mg | ORAL_CAPSULE | ORAL | Status: DC
  Administered 2019-04-21 – 2019-04-22 (×2): 60 mg via ORAL
  Filled 2019-04-20 (×3): qty 1

## 2019-04-20 MED ORDER — AMLODIPINE BESYLATE 5 MG PO TABS
10.0000 mg | ORAL_TABLET | Freq: Every day | ORAL | Status: DC
  Administered 2019-04-21 – 2019-04-22 (×2): 10 mg via ORAL
  Filled 2019-04-20 (×2): qty 2

## 2019-04-20 MED ORDER — LATANOPROST 0.005 % OP SOLN
1.0000 [drp] | Freq: Every day | OPHTHALMIC | Status: DC
  Administered 2019-04-20 – 2019-04-22 (×2): 1 [drp] via OPHTHALMIC
  Filled 2019-04-20: qty 2.5

## 2019-04-20 MED ORDER — PANTOPRAZOLE SODIUM 40 MG PO TBEC
40.0000 mg | DELAYED_RELEASE_TABLET | Freq: Every day | ORAL | Status: DC
  Administered 2019-04-21 – 2019-04-22 (×2): 40 mg via ORAL
  Filled 2019-04-20 (×2): qty 1

## 2019-04-20 MED ORDER — FERROUS SULFATE 325 (65 FE) MG PO TABS: 325.0000 mg | ORAL_TABLET | ORAL | Status: DC

## 2019-04-20 MED ORDER — SODIUM CHLORIDE 0.9 % IV BOLUS
500.0000 mL | Freq: Once | INTRAVENOUS | Status: AC
  Administered 2019-04-20: 500 mL via INTRAVENOUS

## 2019-04-20 MED ORDER — LISINOPRIL 20 MG PO TABS
20.0000 mg | ORAL_TABLET | Freq: Every day | ORAL | Status: DC
  Administered 2019-04-21 – 2019-04-22 (×2): 20 mg via ORAL
  Filled 2019-04-20 (×2): qty 1

## 2019-04-20 MED ORDER — METOPROLOL SUCCINATE ER 25 MG PO TB24
50.0000 mg | ORAL_TABLET | Freq: Every day | ORAL | Status: DC
  Administered 2019-04-21 – 2019-04-22 (×2): 50 mg via ORAL
  Filled 2019-04-20 (×2): qty 2

## 2019-04-20 MED ORDER — ACETAMINOPHEN 325 MG PO TABS
650.0000 mg | ORAL_TABLET | Freq: Once | ORAL | Status: AC
  Administered 2019-04-20: 650 mg via ORAL
  Filled 2019-04-20: qty 2

## 2019-04-20 MED ORDER — DICLOFENAC SODIUM 1 % TD GEL
2.0000 g | Freq: Three times a day (TID) | TRANSDERMAL | Status: DC | PRN
  Administered 2019-04-21: 2 g via TOPICAL
  Filled 2019-04-20: qty 100

## 2019-04-20 MED ORDER — CARVEDILOL 12.5 MG PO TABS
25.0000 mg | ORAL_TABLET | Freq: Two times a day (BID) | ORAL | Status: DC
  Administered 2019-04-20 – 2019-04-22 (×4): 25 mg via ORAL
  Filled 2019-04-20 (×4): qty 2

## 2019-04-20 NOTE — ED Notes (Signed)
Patient transported to MRI 

## 2019-04-20 NOTE — ED Notes (Signed)
Pt given turkey sandwich and water

## 2019-04-20 NOTE — ED Notes (Signed)
Pt declining VS monitoring at this time.

## 2019-04-20 NOTE — BH Assessment (Signed)
BHH Assessment Progress Note This writer attempted to assess patient at 1700 hours although attending RN reports patient is currently having a MRI done and then will be transported to Xray for further testing. Patient will be assessed later this date.

## 2019-04-20 NOTE — ED Notes (Signed)
Transferred from yellow zone in ED. It is expected per nurse transferring him he will be returned to his group home and moved here to wait on TTS to assess and disposition him. He reports he is borderline diabetic and is unable to ambulate without his walker which he doesn't have with him. He states he has no where to live and has not been living in a group home but with friends who are mad at him because he is turning them into some type of board and that made them mad. His son is a [employer] driver and has numerous children and patient cant live there. He brought many medications with him that have not yet been inventoried. He is pleasant and appropriate. He is not psychotic or HI but states SI thoughts come in and out of his head and he doesn't believe his meds are right. He is able to contract for safety at this time. Will continue to monitor for safety

## 2019-04-20 NOTE — ED Provider Notes (Signed)
Care assumed from PA [facility], please see her note for full details, but in brief William Gonzalez is a 63 y.o. male who presents for evaluation of dizziness, lightheadedness and weakness.  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  Typically walks with a walker.  Initial work-up significant for leukocytosis but no obvious source of infection and patient has been afebrile with normal vitals.  Stable hemoglobin, no acute electrolyte derangements, urinalysis without signs of infection.  UDS positive for cocaine and benzos.  Chest x-ray and MRI pending at time of handoff, patient has also reported some intermittent suicidal ideations and had recent psych admission, if MRI is unremarkable we will plan for TTS consult.  Plan: Follow-up labs, MRI and chest x-ray.  If work-up is reassuring and patient is ambulatory with steady gait plan for TTS consultation as patient has endorsed suicidal ideations and reports no will to live.  Physical Exam  BP (!) 156/75 (BP Location: Right Arm)   Pulse 67   Temp 98 F (36.7 C) (Oral)   Resp 19   Ht  (1.676 m)   Wt 72.5 kg   SpO2 100%   BMI 25.80 kg/m   Physical Exam Vitals signs and nursing note reviewed.  Constitutional:      General: He is not in acute distress.    Appearance: He is well-developed. He is not diaphoretic.  HENT:     Head: Normocephalic and atraumatic.  Eyes:     General:        Right eye: No discharge.        Left eye: No discharge.  Cardiovascular:     Rate and Rhythm: Normal rate and regular rhythm.     Heart sounds: Normal heart sounds.  Pulmonary:     Effort: Pulmonary effort is normal. No respiratory distress.     Breath sounds: Normal breath sounds.  Abdominal:     General: Abdomen is flat. Bowel sounds are normal. There is no distension.     Palpations: Abdomen is soft. There is no mass.      Tenderness: There is no abdominal tenderness. There is no guarding.  Musculoskeletal:     Comments: Reporting some right shoulder pain, no obvious deformity and the right upper extremity is neurovascularly intact, 2+ radial pulse, normal sensation and grip strength.  Neurological:     Mental Status: He is alert.     Coordination: Coordination normal.  Psychiatric:        Attention and Perception: He does not perceive auditory or visual hallucinations.        Mood and Affect: Mood normal.        Speech: Speech normal.        Behavior: Behavior normal.        Thought Content: Thought content includes suicidal ideation. Thought content does not include homicidal ideation. Thought content does not include suicidal plan.     ED Course/Procedures   Labs Reviewed  CBC WITH DIFFERENTIAL/PLATELET - Abnormal; Notable for the following components:      Result Value   WBC 15.9 (*)    RBC 3.71 (*)    Hemoglobin 12.3 (*)    HCT 36.5 (*)    Neutro Abs 12.7 (*)    All other components within normal limits  BASIC METABOLIC PANEL - Abnormal; Notable for the following components:   Glucose,  Bld 186 (*)    All other components within normal limits  RAPID URINE DRUG SCREEN, HOSP PERFORMED - Abnormal; Notable for the following components:   Cocaine POSITIVE (*)    Benzodiazepines POSITIVE (*)    All other components within normal limits  URINALYSIS, ROUTINE W REFLEX MICROSCOPIC - Abnormal; Notable for the following components:   Glucose, UA 50 (*)    All other components within normal limits  ACETAMINOPHEN LEVEL - Abnormal; Notable for the following components:   Acetaminophen (Tylenol), Serum <10 (*)    All other components within normal limits  SALICYLATE LEVEL  ETHANOL   Dg Chest 2 View  Result Date: 04/20/2019 CLINICAL DATA:  Dizziness for 2 weeks with cough and slight shortness of breath. History of hypertension and diabetes. EXAM: CHEST - 2 VIEW COMPARISON:  Radiographs 10/09/2018 and  04/19/2018. FINDINGS: The heart size and mediastinal contours are stable. There is a probable coronary artery stent. There is minimal atelectasis at both lung bases, but no edema, confluent airspace opacity, pleural effusion or pneumothorax. The bones appear unremarkable. Telemetry leads overlie the chest. IMPRESSION: No acute cardiopulmonary process. Electronically Signed   By: Carey Bullocks M.D.   On: 04/20/2019 15:14   Dg Shoulder Right  Result Date: 04/20/2019 CLINICAL DATA:  63 year old male with shoulder pain EXAM: RIGHT SHOULDER - 2+ VIEW COMPARISON:  None. FINDINGS: No acute displaced fracture. Glenohumeral joint appears congruent. Degenerative changes of the acromioclavicular joint and the glenohumeral joint with narrowing of the subacromial interval. IMPRESSION: Negative for acute bony abnormality. Degenerative changes of the [facility] joint and AC joint Electronically Signed   By: Gilmer Mor D.O.   On: 04/20/2019 18:26   [name]  Result Date: 04/20/2019 CLINICAL DATA:  Acute presentation with dizziness and lightheadedness. Symptoms worsening. Falling. History of old strokes. EXAM: MRI HEAD WITHOUT CONTRAST TECHNIQUE: Multiplanar, multiecho pulse sequences of the brain and surrounding structures were obtained without intravenous contrast. COMPARISON:  CT 04/25/2018.  MRI 04/19/2018. FINDINGS: Brain: Diffusion imaging does not show any acute or subacute infarction. There are extensive chronic ischemic changes of the pons with old lacunar infarctions and widespread microvascular ischemic change. No focal cerebellar insult. Cerebral hemispheres show chronic small-vessel ischemic changes with old infarctions of the thalami and hemispheric deep white matter. No cortical or large vessel territory infarction. No mass lesion, hemorrhage, hydrocephalus or extra-axial collection. Vascular: Major vessels at the base of the brain show flow. Skull and upper cervical spine: Chronic empty sella with  arachnoid herniation. Otherwise negative. Sinuses/Orbits: Mild mucosal inflammation of the paranasal sinuses. Orbits negative. Other: None IMPRESSION: No acute or reversible finding. Extensive chronic small-vessel ischemic changes affecting the pons, thalami and hemispheric white matter, progressive over time but not acute. Electronically Signed   By: Paulina Fusi M.D.   On: 04/20/2019 17:36   EKG Interpretation  Date/Time:  Monday April 20 2019 12:06:21 EDT Ventricular Rate:  67 PR Interval:    QRS Duration: 104 QT Interval:  379 QTC Calculation: 400 R Axis:   -57 Text Interpretation:  Sinus rhythm Left anterior fascicular block Anteroseptal infarct, old Abnormal T, consider ischemia, diffuse leads No significant change since last tracing Confirmed by Benjiman Core [id number]) on 04/20/2019 1:48:48 PM   Procedures  MDM   Patient presents reporting dizziness he has a chronic history of this, feels it may be worse.  Labs are overall reassuring.  He does have a leukocytosis but with no obvious infectious symptoms or source of infection, no  evidence of pneumonia on chest x-ray and no urinary tract infection, abdominal exam is benign.  Will monitor for fevers or changes in symptoms.  No other significant changes on labs.  MRI shows no acute findings, patient has extensive chronic small vessel disease.  Chest x-ray clear.  Shoulder films show degenerative changes but no acute fracture or injury.  Patient with no focal neurologic deficits on exam and he appears to be at baseline.  He does continue to endorse SI without plan.  Awaiting TTS evaluation for appropriate disposition.  Patient is medically cleared at this time, he has been placed under ED psych hold and home medications have been restarted.  Dr. Jeraldine LootsLockwood who is current psych provider has been made aware of patient pending disposition.       Legrand RamsFord, Kelsey N, PA-C 04/20/19 2153    Benjiman CorePickering, Nathan, MD 04/21/19 [phone]

## 2019-04-20 NOTE — ED Notes (Signed)
Pt ambulated in hall with 2 person assist.

## 2019-04-20 NOTE — ED Notes (Signed)
meds sent with pharm tech to pharmacy. Rest of his property clothing phone and a VISA care is in locker and he has his glasses at his bedside.

## 2019-04-20 NOTE — ED Notes (Signed)
Patient transported to X-ray 

## 2019-04-20 NOTE — ED Triage Notes (Addendum)
Pt states that he lives in a group home ran by a SNL RN through [facility], though group home does not have specific name. States he has had a few days worsening weakness and dizziness. Reports falls past 2-3 days due to weakness/dizziness. Thinks this is due to not being fed enough at group home along with taking his medications.  States he usually eats throughout the day but is barely being fed there. Would also like to speak to social worker regarding possible alternative placements.

## 2019-04-20 NOTE — ED Provider Notes (Signed)
[facility] EMERGENCY DEPARTMENT Provider Note   CSN: [id number] Arrival date & time: 04/20/19  1152    History   Chief Complaint Chief Complaint  Patient presents with   Weakness   Dizziness    HPI William Gonzalez is a 63 y.o. male.     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  States this is a chronic issue for him since his stroke was diagnosed however has been worse for the last several days.  States he has had several falls at home. Normally uses his walker. Denies that he hit his head during any of these falls.  Denies any injuries from these falls.  States that his dizziness is constant but seems to be worse at night when he lays on his back and turns from left to right.  Also worse when standing up. Symptoms improve after several minutes. States he had a headache this AM that has resolve. HA was frontal in nature. No vision changes. No numbness/weakness to BUE and BLE. No recent URI sxs. No cp, sob or cough.  States he has only been eating once per day in his group home.   He is on plavix. Yesterday fell two times due to dizziness. States he did not hit his head.   Pt also voices suicidal ideations during evaluation. He states he has no will to live and states he would overdose on pills to commit suicide.   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 Authorizing Provider  amLODipine (NORVASC) 10 MG tablet Take 1 tablet (10 mg total) by mouth daily. 04/22/18  Yes Burnadette Pop, MD  atorvastatin (LIPITOR) 40 MG tablet Take 1 tablet (40 mg total) by mouth daily at 6 PM. 04/21/18  Yes Adhikari, Amrit, MD  carvedilol (COREG) 25 MG tablet Take 25 mg by mouth 2 (two) times a day. If sbp less than 110 or heart rate 55   Yes [provider]  clopidogrel (PLAVIX) 75 MG tablet Take 1 tablet (75 mg total) by mouth daily. 04/22/18  Yes Burnadette Pop, MD  diclofenac sodium (VOLTAREN) 1 % GEL Apply 2 g topically 3 (three) times daily as needed (pain).   Yes [provider]  DULoxetine (CYMBALTA) 30 MG capsule Take 60 mg by mouth every morning.   Yes [provider]  ferrous sulfate 325 (65 FE) MG tablet Take 325 mg by mouth See  admin instructions. Monday Wednesday Friday   Yes [provider]  glipiZIDE (GLUCOTROL) 10 MG tablet Take 10 mg by mouth 2 (two) times daily before a meal.   Yes [provider]  glucose 4 GM chewable tablet Chew 4 tablets by mouth as needed for low blood sugar.   Yes [provider]  latanoprost (XALATAN) 0.005 % ophthalmic solution Place 1 drop into both eyes at bedtime.   Yes [provider]  lisinopril (PRINIVIL,ZESTRIL) 20 MG tablet Take 1 tablet (20 mg total) by mouth daily. 04/21/18  Yes Burnadette Pop, MD  Menthol, Topical Analgesic, (BIOFREEZE ROLL-ON) 4 % GEL Apply 1 application topically daily as needed (pain).   Yes [provider]  metFORMIN (GLUCOPHAGE) 500  MG tablet Take 1,000 mg by mouth 2 (two) times daily with a meal.   Yes [provider]  omeprazole (PRILOSEC) 20 MG capsule Take 20 mg by mouth daily.   Yes [provider]  amantadine (SYMMETREL) 100 MG capsule Take 1 capsule (100 mg total) by mouth 2 (two) times daily. For cravings 04/13/19   Aldean Baker, NP  metFORMIN (GLUCOPHAGE) 1000 MG tablet Take 1 tablet (1,000 mg total) by mouth 2 (two) times daily with a meal. Patient not taking: Reported on 04/20/2019 07/04/12   Molpus, John, MD  metoprolol succinate (TOPROL-XL) 50 MG 24 hr tablet Take 1 tablet (50 mg total) by mouth daily. Take with or immediately following a meal for high blood pressure 04/14/19   Aldean Baker, NP  nicotine (NICODERM CQ - DOSED IN MG/24 HOURS) 21 mg/24hr patch Place 1 patch (21 mg total) onto the skin daily. For smoking cessation Patient not taking: Reported on 04/20/2019 04/14/19   Aldean Baker, NP  vortioxetine HBr (TRINTELLIX) 10 MG TABS tablet Take 1 tablet (10 mg total) by mouth daily. For mood 04/14/19   Aldean Baker, NP    Family History Family History  Problem Relation Age of Onset   Hypertension Mother    Diabetes Mother    Hypertension Father    Diabetes Father    Hypertension Sister    Hypertension Brother    Diabetes Sister    Diabetes Brother     Social History Social History   Tobacco Use   Smoking status: Current Every Day Smoker    Packs/day: 0.30    Types: Cigarettes   Smokeless tobacco: Never Used  Substance Use Topics   Alcohol use: Yes    Alcohol/week: 1.0 standard drinks    Types: 1 Cans of beer per week    Comment: occassionally    Drug use: Yes    Types: Cocaine     Allergies   Iodine   Review of Systems Review of Systems  Constitutional: Negative for chills and fever.  HENT: Negative for ear pain and sore throat.   Eyes: Negative for pain and visual disturbance.  Respiratory: Negative for cough and shortness of breath.     Cardiovascular: Negative for chest pain.  Gastrointestinal: Negative for abdominal pain and vomiting.  Genitourinary: Negative for dysuria and hematuria.  Musculoskeletal: Negative for arthralgias and back pain.  Skin: Negative for color change and rash.  Neurological: Positive for dizziness and light-headedness.  All other systems reviewed and are negative.   Physical Exam Updated Vital Signs BP (!) 169/77    Pulse 67    Temp 98 F (36.7 C) (Oral)    Resp 18    Ht  (1.676 m)  Wt 72.5 kg    SpO2 99%    BMI 25.80 kg/m   Physical Exam Vitals signs and nursing note reviewed.  Constitutional:      Appearance: He is well-developed.  HENT:     Head: Normocephalic and atraumatic.  Eyes:     Conjunctiva/sclera: Conjunctivae normal.  Neck:     Musculoskeletal: Neck supple.  Cardiovascular:     Rate and Rhythm: Normal rate and regular rhythm.     Heart sounds: No murmur.  Pulmonary:     Effort: Pulmonary effort is normal. No respiratory distress.     Breath sounds: Normal breath sounds.  Abdominal:     Palpations: Abdomen is soft.     Tenderness: There is no abdominal tenderness.  Skin:    General: Skin is warm and dry.  Neurological:     Mental Status: He is alert.     Comments: Mental Status:  Alert, thought content appropriate, able to give a coherent history. Speech fluent without evidence of aphasia. Able to follow 2 step commands without difficulty.  Cranial Nerves:  II: pupils equal, round, reactive to light III,IV, VI: ptosis not present, extra-ocular motions intact bilaterally  V,VII: smile symmetric, facial light touch sensation equal VIII: hearing grossly normal to voice  X: uvula elevates symmetrically  XI: bilateral shoulder shrug symmetric and strong XII: midline tongue extension without fassiculations Motor:  Normal tone. 5/5 strength of BUE and BLE major muscle groups including strong and equal grip strength and dorsiflexion/plantar flexion Sensory:  light touch normal in all extremities. Cerebellar: some dysmetria with bilat finger to nose Gait: not assessed due to concern for instability.  CV: 2+ radial and DP/PT pulses + romberg, negative pronator drift     ED Treatments / Results  Labs (all labs ordered are listed, but only abnormal results are displayed) Labs Reviewed  CBC WITH DIFFERENTIAL/PLATELET - Abnormal; Notable for the following components:      Result Value   WBC 15.9 (*)    RBC 3.71 (*)    Hemoglobin 12.3 (*)    HCT 36.5 (*)    Neutro Abs 12.7 (*)    All other components within normal limits  BASIC METABOLIC PANEL - Abnormal; Notable for the following components:   Glucose, Bld 186 (*)    All other components within normal limits  RAPID URINE DRUG SCREEN, HOSP PERFORMED - Abnormal; Notable for the following components:   Cocaine POSITIVE (*)    Benzodiazepines POSITIVE (*)    All other components within normal limits  URINALYSIS, ROUTINE W REFLEX MICROSCOPIC - Abnormal; Notable for the following components:   Glucose, UA 50 (*)    All other components within normal limits  ACETAMINOPHEN LEVEL - Abnormal; Notable for the following components:   Acetaminophen (Tylenol), Serum <10 (*)    All other components within normal limits  SALICYLATE LEVEL  ETHANOL    EKG EKG Interpretation  Date/Time:  Monday April 20 2019 12:06:21 EDT Ventricular Rate:  67 PR Interval:    QRS Duration: 104 QT Interval:  379 QTC Calculation: 400 R Axis:   -57 Text Interpretation:  Sinus rhythm Left anterior fascicular block Anteroseptal infarct, old Abnormal T, consider ischemia, diffuse leads No significant change since last tracing Confirmed by Benjiman CorePickering, Nathan [phone]) on 04/20/2019 1:48:48 PM   Radiology Dg Chest 2 View  Result Date: 04/20/2019 CLINICAL DATA:  Dizziness for 2 weeks with cough and slight shortness of breath. History of hypertension and diabetes. EXAM: CHEST - 2 VIEW  COMPARISON:  Radiographs 10/09/2018 and  04/19/2018. FINDINGS: The heart size and mediastinal contours are stable. There is a probable coronary artery stent. There is minimal atelectasis at both lung bases, but no edema, confluent airspace opacity, pleural effusion or pneumothorax. The bones appear unremarkable. Telemetry leads overlie the chest. IMPRESSION: No acute cardiopulmonary process. Electronically Signed   By: Carey Bullocks M.D.   On: 04/20/2019 15:14    Procedures Procedures (including critical care time)  Medications Ordered in ED Medications  sodium chloride 0.9 % bolus 500 mL (0 mLs Intravenous Stopped 04/20/19 1420)  LORazepam (ATIVAN) tablet 1 mg (1 mg Oral Given 04/20/19 1622)     Initial Impression / Assessment and Plan / ED Course  I have reviewed the triage vital signs and the nursing notes.  Pertinent labs & imaging results that were available during my care of the patient were reviewed by me and considered in my medical decision making (see chart for details).       Final Clinical Impressions(s) / ED Diagnoses   Final diagnoses:  Shoulder pain   Pt presenting with c/o dizziness. Has chronic dizziness s/p cerebellar infarct but symptoms seem worse today.  Is also voicing suicidal thoughts as well.  Has had recent evaluations by behavioral health.  Will obtain labs, EKG, CXR, and MRI.  Sinus rhythm, Left anterior fascicular block, Anteroseptal infarct, old Abnormal T, consider ischemia, diffuse leads No significant change since last tracing  CXR with no acute cardiopulmonary process  Pt care signed out to Jodi Geralds, PA-C pending workup. If pts workup is reassuring, he will require TTS consult due to his suicidality.  ED Discharge Orders    None       Rayne Du 04/20/19 1636    Benjiman Core, MD 04/21/19 [phone]

## 2019-04-20 NOTE — ED Notes (Signed)
Pt given sandwich bag and ginger ale.  

## 2019-04-21 ENCOUNTER — Encounter (HOSPITAL_COMMUNITY): Payer: Self-pay | Admitting: Registered Nurse

## 2019-04-21 ENCOUNTER — Inpatient Hospital Stay (HOSPITAL_COMMUNITY): Admission: AD | Admit: 2019-04-21 | Payer: Medicare Other | Source: Intra-hospital | Admitting: Psychiatry

## 2019-04-21 DIAGNOSIS — R45851 Suicidal ideations: Secondary | ICD-10-CM | POA: Diagnosis not present

## 2019-04-21 DIAGNOSIS — F339 Major depressive disorder, recurrent, unspecified: Secondary | ICD-10-CM

## 2019-04-21 DIAGNOSIS — F332 Major depressive disorder, recurrent severe without psychotic features: Secondary | ICD-10-CM | POA: Diagnosis not present

## 2019-04-21 DIAGNOSIS — M25511 Pain in right shoulder: Secondary | ICD-10-CM | POA: Diagnosis not present

## 2019-04-21 LAB — CBG MONITORING, ED
Glucose-Capillary: 124 mg/dL — ABNORMAL HIGH (ref 70–99)
Glucose-Capillary: 134 mg/dL — ABNORMAL HIGH (ref 70–99)

## 2019-04-21 MED ORDER — ARIPIPRAZOLE 2 MG PO TABS: 2.0000 mg | ORAL_TABLET | Freq: Every day | ORAL | 0 refills | Status: AC

## 2019-04-21 MED ORDER — ARIPIPRAZOLE 2 MG PO TABS
2.0000 mg | ORAL_TABLET | Freq: Every day | ORAL | Status: DC
  Administered 2019-04-21 – 2019-04-22 (×2): 2 mg via ORAL
  Filled 2019-04-21 (×2): qty 1

## 2019-04-21 NOTE — ED Notes (Addendum)
RN had pt wanded by security since it seemed to not have happened. Patient placement was called for safety sitter.

## 2019-04-21 NOTE — Progress Notes (Signed)
Inpatient Diabetes Program Recommendations  AACE/ADA: New Consensus Statement on Inpatient Glycemic Control (2015)  Target Ranges:  Prepandial:   less than 140 mg/dL      Peak postprandial:   less than 180 mg/dL (1-2 hours)      Critically ill patients:  140 - 180 mg/dL    Results for KIERIAN, HOGGAN (MRN [id number]) as of 04/21/2019 07:13  Ref. Range 04/20/2019 13:03  Glucose Latest Ref Range: 70 - 99 mg/dL 449 (H)   Results for MARLONE, PARADISO (MRN [id number]) as of 04/21/2019 07:13  Ref. Range 04/12/2019 06:19  Hemoglobin A1C Latest Ref Range: 4.8 - 5.6 % 7.8 (H)    To ED with: Weakness/ Dizziness/ Suicidal Ideation  History: DM, ETOH Abuse  Group Home DM Meds: Metformin 1000 mg BID         Glipizide 10 mg BID  Current Orders: Metformin 1000 mg BID        Glipizide 10 mg BID     MD- Please make sure patient has order for CBG checks TID AC + HS while in the ED  Please also change PO diet to Carbohydrate Modified diet     --Will follow patient during hospitalization--  Ambrose Finland RN, MSN, CDE Diabetes Coordinator Inpatient Glycemic Control Team Team Pager: [phone] (8a-5p)

## 2019-04-21 NOTE — Progress Notes (Signed)
CSW called pt's son Derius, Latney at ph: [phone] and pt's son did not answer the phone.  CSW left a HIPPA-compliant VM requesting a call back to confirm pt's son's plan to p/u pt on 04/22/19.   CSW awaiting return call from pt's son.  CSW will continue to follow for D/C needs.  Dorothe Pea. Fatime Biswell, LCSW, LCAS, CSI Transitions of Care Clinical Social Worker Care Coordination Department Ph: [phone]

## 2019-04-21 NOTE — ED Notes (Signed)
BFAST ORDERED

## 2019-04-21 NOTE — Progress Notes (Addendum)
CSW received a call from [facility] CM stating provider would like a follow up with pt's son to :  1.  Assist son with establishing contact with CSW Dept so son can speak with CSW on 4/29 regarding picking up the pt.  2. Document that son is aware plan is for pt's son to p/u the pt on 4/29.  Per notes, pt has been agreeable to p/u pt on 4/29 and then later denied knowing he was to p/u the pt.  Per TOC RN CM, RN was unable to establish contact with pt's son to confirm plan with TOC Dept.  RN note from 7:15 pm states pt's son is unaware of plan.  CSW will continue to follow for D/C needs.  Dorothe Pea. Airon Sahni, LCSW, LCAS, CSI Transitions of Care Clinical Social Worker Care Coordination Department Ph: [phone]

## 2019-04-21 NOTE — ED Notes (Signed)
Is unable to walk without support at home he uses a walker but he did not bring it with him. Uses urinal at bedside. He is pleasant and able to make needs known. TTS counselor spoke with him via tele assessment and decision made to keep him overnight and re evaluate in am. He has been made aware of the disposition.

## 2019-04-21 NOTE — BH Assessment (Addendum)
Tele Assessment Note   Patient Name: William Gonzalez MRN: [id number] Referring Physician: Jodi Geralds, PA-C Location of Patient: [facility] Location of Provider: Behavioral Health TTS Department  William Gonzalez is a 63 y.o. male who was brought to [facility] due to experiencing weakness and dizziness. Pt reported falls and SI to EDP. Pt reports he has been living with a couple that had agreed to assist him with his medical difficulties, but he states they haven't been helping him much. Pt states he believes he might need a change in his medications, though he has had to re-schedule his appointments with his psychiatrist multiple times due to the William Gonzalez. Pt states he believes he hasn't been to see his psychiatrist in approximately 6 months.  Pt states he last experienced SI yesterday while he was fighting with the people who he is currently living with; prior to that he states the last SI he experienced was two weeks ago when he went to [facility]. Pt denies he has ever attempted to kill himself and shares he has been hospitalized two times ever, ith the most recent being the [place] in December 2019. Pt denies HI, AVH, NSSIB, involvement in the court system, or access to weapons, including guns. Pt acknowledges he had been using substances up until 2 weeks ago; he states that he had been using EtOH daily and cocaine "every once in a while." Pt states that on 04/08/2019 he drank a pint of gin, 12 beers, and did 1/2 gram of cocaine.  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 Pt states he cannot currently use his left side well due to the strokes, so he cannot use the wheelchair he currently has very well since it's not electronic/motorized.  Pt listed his "marine buddy" as his greatest support; pt declined to allow clinician to contact this support,  stating he didn't want his friend to be involved with this situation.  Pt is oriented x4. His recent and remote memory is intact. Pt was cooperative and pleasant throughout the assessment process. Pt's insight, judgement, and impulse control is fair at this time.   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 Alcohol abuse   . Coronary artery disease   . Diabetes mellitus   . Hypertension   . Stented coronary artery   . Tobacco abuse     Past Surgical History:  Procedure Laterality Date  . CORONARY STENT PLACEMENT      Family History:  Family History  Problem Relation Age of Onset  . Hypertension Mother   . Diabetes Mother   . Hypertension Father   . Diabetes Father   . Hypertension Sister   . Hypertension Brother   . Diabetes Sister   . Diabetes Brother     Social History:  reports that he has been smoking cigarettes. He has been smoking about 0.30 packs per day. He has never used smokeless tobacco. He reports current alcohol use of about 1.0 standard drinks of alcohol per week. He reports current drug use. Drug: Cocaine.  Additional Social History:  Alcohol / Drug Use Pain Medications: Please see MAR Prescriptions: Please see MAR Over the Counter: Please see MAR History  of alcohol / drug use?: Yes Longest period of sobriety (when/how long): Since 04/08/2019 Substance #1 Name of Substance 1: EtOH 1 - Age of First Use: Unknown 1 - Amount (size/oz): 1 pint of gin and 12 beers 1 - Frequency: Daily 1 - Duration: Unknown 1 - Last Use / Amount: 04/08/2019 Substance #2 Name of Substance 2: Cocaine 2 - Age of First Use: Unknown 2 - Amount (size/oz): 1/2 gram 2 - Frequency: "Every once in a while" 2 - Duration: Unknown 2 - Last Use / Amount: 04/08/2019  CIWA: CIWA-Ar BP: (!) 163/69 Pulse Rate: 71 COWS:     Allergies:  Allergies  Allergen Reactions  . Iodine Anaphylaxis and Swelling    Swells all over    Home Medications: (Not in a hospital admission)   OB/GYN Status:  No LMP for male patient.  General Assessment Data Assessment unable to be completed: Yes Reason for not completing assessment: Pt is having a MRI [facility] Assessment: In system Is this a Tele or Face-to-Face Assessment?: Tele Assessment Is this an Initial Assessment or a Re-assessment for this encounter?: Initial Assessment Patient Accompanied by:: N/A Language Other than English: No Living Arrangements: Other (Comment)(Pt lives with his cousin and their spouse) What gender do you identify as?: Male Marital status: Single Maiden name: Prentiss BellsWaterman Pregnancy Status: No Living Arrangements: Other relatives Can pt return to current living arrangement?: Yes Admission Status: Voluntary Is patient capable of signing voluntary admission?: Yes Referral Source: Self/Family/Friend Insurance type: Medicare     Crisis Care Plan Living Arrangements: Other relatives Legal Guardian: (N/A) [facility] Name of Therapist: None  Education Status Is patient currently in school?: No Highest grade of school patient has completed: Associates Degree in business Is the patient employed, unemployed or receiving disability?: Receiving disability income  Risk to self with the past 6 months Suicidal Ideation: Yes-Currently Present Has patient been a risk to self within the past 6 months prior to admission? : Yes Suicidal Intent: No Has patient had any suicidal intent within the past 6 months prior to admission? : Yes Is patient at risk for suicide?: No Suicidal Plan?: Yes-Currently Present Has patient had any suicidal plan within the past 6 months prior to admission? : Yes Specify Current Suicidal Plan: Pt plans to o/d on his medication Access to Means: Yes Specify Access to  Suicidal Means: Pt has access to his medication What has been your use of drugs/alcohol within the last 12 months?: Pt acknowledges use of EtOH and cocaine Previous Attempts/Gestures: No How many times?: 0 Other Self Harm Risks: Pt has physical needs due to prior strokes Triggers for Past Attempts: Other personal contacts, Other (Comment)(Difficulties with the relatives pt is living with) Intentional Self Injurious Behavior: None Family Suicide History: No Recent stressful life event(s): Conflict (Comment)(Conflict with the people pt is living with) Persecutory voices/beliefs?: No Depression: Yes Depression Symptoms: Feeling angry/irritable, Loss of interest in usual pleasures Substance abuse history and/or treatment for substance abuse?: Yes Suicide prevention information given to non-admitted patients: Not applicable  Risk to Others within the past 6 months Homicidal Ideation: No Does patient have any lifetime risk of violence toward others beyond the six months prior to admission? : No Thoughts of Harm to Others: No Current Homicidal Intent: No Current Homicidal Plan: No Access to Homicidal Means: No Identified Victim: None noted History of harm to others?: No Assessment of Violence: On admission Violent Behavior Description: None noted Does patient  have access to weapons?: No(Pt denies access to weapons) Criminal Charges Pending?: No Does patient have a court date: No Is patient on probation?: No  Psychosis Hallucinations: None noted Delusions: None noted  Mental Status Report Appearance/Hygiene: Unremarkable, In scrubs Eye Contact: Good Motor Activity: Freedom of movement, Other (Comment)(Pt is lying down in his hospital bed) Speech: Logical/coherent Level of Consciousness: Alert Mood: Pleasant Affect: Appropriate to circumstance Anxiety Level: Minimal Thought Processes: Coherent, Relevant Judgement: Partial Orientation: Person, Place, Time, Situation Obsessive  Compulsive Thoughts/Behaviors: None  Cognitive Functioning Concentration: Normal Memory: Recent Intact, Remote Intact Is patient IDD: No Insight: Fair Impulse Control: Poor Appetite: Good Have you had any weight changes? : No Change Sleep: No Change Total Hours of Sleep: 7 Vegetative Symptoms: None  ADLScreening [facility] Assessment Services) Patient's cognitive ability adequate to safely complete daily activities?: Yes Patient able to express need for assistance with ADLs?: Yes Independently performs ADLs?: Yes (appropriate for developmental age)  Prior Inpatient Therapy Prior Inpatient Therapy: Yes Prior Therapy Dates: 11/2018 Prior Therapy Facilty/Provider(s): [facility] in [place], [place] Reason for Treatment: Depression, SI, EtOH  Prior Outpatient Therapy Prior Outpatient Therapy: No Does patient have an ACCT team?: No Does patient have Intensive In-House Services?  : No Does patient have [facility] services? : No Does patient have P4CC services?: No  ADL Screening (condition at time of admission) Patient's cognitive ability adequate to safely complete daily activities?: Yes Is the patient deaf or have difficulty hearing?: No Does the patient have difficulty seeing, even when wearing glasses/contacts?: No Does the patient have difficulty concentrating, remembering, or making decisions?: No Patient able to express need for assistance with ADLs?: Yes Does the patient have difficulty dressing or bathing?: No Independently performs ADLs?: Yes (appropriate for developmental age) Does the patient have difficulty walking or climbing stairs?: Yes Weakness of Legs: Left Weakness of Arms/Hands: Left  Home Assistive Devices/Equipment Home Assistive Devices/Equipment: Environmental consultant (specify type), Wheelchair  Therapy Consults (therapy consults require a physician order) PT Evaluation Needed: No OT Evalulation Needed: No SLP Evaluation Needed: No Abuse/Neglect Assessment (Assessment to be  complete while patient is alone) Abuse/Neglect Assessment Can Be Completed: Yes Physical Abuse: Denies Verbal Abuse: Denies Sexual Abuse: Denies Exploitation of patient/patient's resources: Yes, past (Comment)(Pt states his financial resources were drained by those he resides with) Self-Neglect: Denies Values / Beliefs Cultural Requests During Hospitalization: None Spiritual Requests During Hospitalization: None Consults Spiritual Care Consult Needed: No Social Work Consult Needed: No Merchant navy officer (For Healthcare) Does Patient Have a Medical Advance Directive?: No Does patient want to make changes to medical advance directive?: No - Patient declined Type of Advance Directive: Healthcare Power of [employer] of Healthcare Power of Attorney in Chart?: No - copy requested Would patient like information on creating a medical advance directive?: No - Patient declined        Disposition: Nira Conn, NP, reviewed pt's chart and information and determined pt should be observed overnight for safety and stability due to discussion of SI with a plan and then later denial. This information was provided to unit nurse Harriett Sine, RN, at [phone].  Disposition Initial Assessment Completed for this Encounter: Yes Patient referred to: Other (Comment)(Pt will be observed overnight for safety and stability)  This service was provided via telemedicine using a 2-way, interactive audio and video technology.  Names of all persons participating in this telemedicine service and their role in this encounter. Name: Jazziel Fitzsimmons Role: Patient  Name: Duard Brady Role: Clinician    Lelon Mast  L Jaeleah Smyser 04/21/2019 1:18 AM

## 2019-04-21 NOTE — Discharge Instructions (Signed)
Substance Abuse Treatment Programs ° °Intensive Outpatient Programs °[facility]     °[address]      °[place], [place]                   °[phone]      ° °[facility] °[address] °[place], [place] °[phone] ° °[facility]     °(Inpatient and outpatient)     °[address]           °[phone]   ° °[facility] °[phone] (Suboxone and Methadone) ° °[address]      °[place], [place] [address]      °[phone]      ° °[address] °[place], [place] °852-3033 ° °[facility] (Outpatient/Inpatient, [facility])    °(insurance only) [phone]      °       °[facility] (Groups & Residential) °[place], [place] °[phone] ° °   °[facility]     °[address]     °[place], [place]      °[phone]      ° °[facility] (for caregivers and family) °[address] °[place], [place] °336-299-4655 ° ° ° ° ° °Residential Treatment Programs °[facility]      °[address], [place], [place] [address]  °([phone]      ° °[facility] °[address] [place], [place] [address] °[phone] ° °[facility]        °336-248-8914      ° °[facility] °1-800-659-3381 ° °[facility]             °[address]                                         °[place], [place]                                                °877-615-2722 or 336-784-9470                              ° °[facility] [place] °[address] °[place], [address] °1.877.941.8954 ° °[facility]    °[address]      °[place], [place]     °[phone]      ° °[facility] °[address] °[place], [place] °336-285-9073 ° °[facility]   °[address]     °[place], [place] [address]     °[phone]      °Admissions: 8am-3pm M-F ° °[facility] °[address] °[place],  [place] °336-227-7417 ° °[facility]: Residential Program (90 Days)   °[place], [place]      °336-725-8389 or 800-758-6077    ° °[facility] °[place] °(Walk in Hours over the weekend or by referral) ° °[facility] °[address], [place], [place] [address] °([phone] ° °Crisis Mobile: [facility]:  1-877-626-1772 (for crisis response 24 hours a day) °[facility] Hotline:      1-800-256-2452 °Outpatient Psychiatry and Counseling ° °[facility]: Mobile Crisis   Management 24 hours:  1-877-626-1772 ° °[facility] sliding scale fee and walk in schedule: M-F 8am-12pm/1pm-3pm °[address]  °[place], [place] [address] °[phone] ° °[facility] °[address] °[place], [place] [address] °336-703-9650 ° °[facility] ([facility] known [facility])- new patient walk-in appointments available Monday - Friday 8am -3pm.          °[address] °[place], [place] [address] °336-676-6840 or crisis line- 336-676-6905 ° °[facility]/ Intensive Outpatient Therapy Program °[address] °[place], [place] [address] °336-832-9804 ° °[facility]                  °Crisis Services      °[phone]      °[address]     °[place], [place] [address]                ° °[facility]   °[facility] °[phone] °[address] °[place], [place] [address] ° ° °[facility]          °[address],  °[place], [place] [address]       °([phone] ° °[facility] °[address] °[place], [place] [address] °336-292-1510 ° °[facility]    °[address]    °[place], [place] [address]     °[phone]      ° °Parish McKinney, MD     °[address]     °[place] [address]     °[phone]     °  °[facility] °[address] °[place] [address] ° °[facility]     °[address]     °[place], [place]      °336-542-2076      ° °[facility] °Shamsher Ahluwalia, MD °[address] °[place], [place] [address] °[phone] ° °[facility]     °[address]     °[place], [place] [address]     °336-274-1237      ° °[facility] °[address] °[place], [place] [address] °336-854-4450 °Resources for Temporary Residential Assistance/Crisis Centers ° °DAY CENTERS °[facility] °M-F 8am-3pm   °[address] [place], [place] [address]   336-332-0824 °Services include: laundry, barbering, support groups, case management, phone  & computer access, showers, AA/NA mtgs, mental health/substance abuse nurse, job skills class, disability information, VA assistance, spiritual classes, etc.  ° °HOMELESS SHELTERS ° °[facility]     °[facility]   °[address], [place]     °[phone]       °       °[facility] (women and children)       °[address] °[place], [place] [address] °336-275-0820 °[email or web address] for application and process °Application Required ° °[facility]   °[address]    °[place] [address]     °[phone]       °             °[facility] °[address] °, [place] [address] °336.273.5572 °336-235-0363(schedule application appt.) °Application Required ° °[facility] (women only)    °[address]     °[place],  [address]     °336-884-1039      °  Intake starts 6pm daily °Need valid ID, SSC, & Police report °[facility] [place] °[address] °[place], [place] °336-881-5420 °Application Required ° °[facility] (men only)     °[address]      °[place], [place]     °[phone]      ° °[facility] °(Pregnant women only) °[address] °[place], [place] °336-275-0206 ° °[facility]      °[address]      °[place], [place] [address]     °[phone]      °       °[facility] °[address] °[place], [place] °336-723-1848 °90 day commitment/SA/Application process ° °[facility](men only)     °[address]     °[place], [place]     °[phone]       °Check-in at 7pm     °       °[facility] °[address] °[place], [place] [address] °336-248-6684 °Men/Women/Women and Children must be there by 7 pm ° °[facility] °[place],  °336-722-8721                ° °

## 2019-04-21 NOTE — ED Notes (Signed)
Pt indicated he talked to his son and he was not aware that he was picking him up tomorrow. In order to avoid any issues tomorrow I contacted CM to follow up and help sort out where this pt will be going and with who.
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
## 2019-04-21 NOTE — ED Provider Notes (Signed)
Blood pressure (!) 161/57, pulse 98, temperature 98.5 F (36.9 C), temperature source Oral, resp. rate 17, height 5\' 6"  (1.676 m), weight 72.5 kg, SpO2 98 %.  In short, William Gonzalez is a 63 y.o. male with a chief complaint of Weakness; Dizziness; and Suicidal .  Refer to the original H&P for additional details.  10:00 AM  Spoke to behavioral health.  They recommend starting Abilify 2 mg daily.  I have added this to the patient's medications and will send a 30-day prescription to his pharmacy.  The patient's son is traveling back from [place] and would not be able to pick the patient up until late today or tomorrow. Patient cannot return to his current residence at this time. Will involve case manager but patient may have to board in the ED overnight.     Maia Plan, MD 04/21/19 1005

## 2019-04-21 NOTE — Care Management (Signed)
ED CM spoke with the patient at the bedside. The patient gave verbal permission to speak with his son [name]. ED CM attempted to reach [facility] and left a message on [employer] requesting a return call. ED CM also texted [name] requesting a return call. ED CSW at [facility] made aware of the the conflicting information related to the patient's son picking him up tomorrow. The patient states his son does not live in [place]. [place]. Rubie Maid RN CCM

## 2019-04-21 NOTE — Care Management (Signed)
ED CM spoke with the patient's son Benhard Alpizar via telephone. [name] states he is not able to pick up the patient tomorrow because he will be at work. [name] lives in [place] but works for [employer] driving to and from [place]. [name] states the patient's friend ,"Zip", will pick up the patient tomorrow. The patient has Zip's telephone and Zip has agreed to pick up the patient when he is discharged. [name] states Zip will need to be notified before the patient is discharged so that he will be here at discharge. ED CM informed [name] he does not need to call the ED CSW back tonight since the call is for the same reason. ED CM notified Christiane Ha, ED CSW of the plan. Rubie Maid RN CCM

## 2019-04-21 NOTE — Progress Notes (Signed)
Patient is psych cleared per Shuvon Rankin.  Patient's son lives in [place] and does not want patient going back to where he was living.  Son is coming tomorrow to pick patient up.  EDP notified.  Timmothy Euler. Kaylyn Lim, MSW, LCSWA Disposition Clinical Social Work [phone] (cell) [phone] (office)

## 2019-04-22 DIAGNOSIS — M25511 Pain in right shoulder: Secondary | ICD-10-CM | POA: Diagnosis not present

## 2019-04-22 LAB — CBG MONITORING, ED: Glucose-Capillary: 74 mg/dL (ref 70–99)

## 2019-04-22 NOTE — ED Notes (Signed)
Pt CBG 74. Notified Erskine Squibb, Charity fundraiser.

## 2019-04-22 NOTE — ED Notes (Signed)
Up to use bathroom, ambulated with walker self to bathroom. Complained of back pain of a 7, gave him Tylenol for his complaint and returned to bed.

## 2019-04-22 NOTE — ED Notes (Signed)
On the phone talking with home health assistant

## 2019-04-22 NOTE — ED Notes (Addendum)
Pt reports that his friend is going to take him to get his belongings/id and that thinks he has a place to stay.  Pt reports that his son can get him a hotel room but he needs to get his id. Will notify CSW.
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
## 2019-04-22 NOTE — ED Notes (Signed)
Pt still attempting to find a place to stay

## 2019-04-22 NOTE — ED Notes (Signed)
Pt reports that he can not return to where he was living, cam not stay with his friend zip, and his son is a IT trainer that goes out of town  daily.

## 2019-04-22 NOTE — ED Notes (Signed)
P;t's cell phone returned so he can attempt to contact family/friends for a place to stay

## 2019-04-22 NOTE — ED Notes (Signed)
On the phone talking with CSW

## 2019-04-22 NOTE — ED Notes (Signed)
Juice given

## 2019-04-22 NOTE — ED Notes (Signed)
Breakfast ordered 

## 2019-04-22 NOTE — ED Notes (Signed)
1930--medications returned to pt

## 2019-04-22 NOTE — ED Notes (Signed)
Attempted to contact pt concerning medications, no answer, unable to leave message

## 2019-04-22 NOTE — ED Notes (Signed)
Message left for pt to call concerning medications left in pharmacy

## 2019-04-22 NOTE — ED Notes (Signed)
Pt contacted (zip"s # [phone]) and will return to pick up medications

## 2019-04-22 NOTE — ED Notes (Addendum)
19 Zip pt's friend contacted and will bring the pt up to the ED to get his medications  approx 1930 tonight from triage. Charge nurse aware.

## 2019-04-22 NOTE — ED Notes (Signed)
Writer late giving his HS Symmetrel due to no dose available. Given once pharmacy sent missing dose and patient awake to urinate.
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
## 2019-04-22 NOTE — Progress Notes (Signed)
CSW spoke with patient's friend "Zip" regarding picking patient up today. Zip says that he is still able to pick patient up and wants a phone call when patient is ready. Notified RN Wille Celeste about this plan. Please notify CSW about any other concerns.   Geralyn Corwin, LCSW Transitions of Care Department [facility] ED [phone]

## 2019-04-22 NOTE — ED Notes (Addendum)
Written dc instructions and follow up reviewed with pt.  Pt encouraged to contact his PMD and psychiatrist for follow up.  Pt denies si/hi/avh at this time and was encouraged to return/seek treatment for any changes.  Pt verbalized understanding.  Belongings returned, pt assisted to [facility]  with NT via w/c,  Ride is here to pick him up.

## 2019-04-28 ENCOUNTER — Inpatient Hospital Stay (HOSPITAL_COMMUNITY)
Admission: EM | Admit: 2019-04-28 | Discharge: 2019-05-11 | DRG: 897 | Disposition: A | Discharge status: Skilled Nursing Facility | Payer: Medicare Other | Attending: Student in an Organized Health Care Education/Training Program | Admitting: Student in an Organized Health Care Education/Training Program

## 2019-04-28 ENCOUNTER — Other Ambulatory Visit: Payer: Self-pay

## 2019-04-28 ENCOUNTER — Emergency Department (HOSPITAL_COMMUNITY): Payer: Medicare Other

## 2019-04-28 ENCOUNTER — Encounter (HOSPITAL_COMMUNITY): Payer: Self-pay

## 2019-04-28 DIAGNOSIS — F329 Major depressive disorder, single episode, unspecified: Secondary | ICD-10-CM | POA: Diagnosis present

## 2019-04-28 DIAGNOSIS — Z833 Family history of diabetes mellitus: Secondary | ICD-10-CM

## 2019-04-28 DIAGNOSIS — F1994 Other psychoactive substance use, unspecified with psychoactive substance-induced mood disorder: Secondary | ICD-10-CM

## 2019-04-28 DIAGNOSIS — E871 Hypo-osmolality and hyponatremia: Secondary | ICD-10-CM | POA: Diagnosis not present

## 2019-04-28 DIAGNOSIS — I352 Nonrheumatic aortic (valve) stenosis with insufficiency: Secondary | ICD-10-CM | POA: Diagnosis present

## 2019-04-28 DIAGNOSIS — E119 Type 2 diabetes mellitus without complications: Secondary | ICD-10-CM | POA: Diagnosis present

## 2019-04-28 DIAGNOSIS — R079 Chest pain, unspecified: Secondary | ICD-10-CM | POA: Diagnosis not present

## 2019-04-28 DIAGNOSIS — Z888 Allergy status to other drugs, medicaments and biological substances status: Secondary | ICD-10-CM

## 2019-04-28 DIAGNOSIS — R0789 Other chest pain: Secondary | ICD-10-CM

## 2019-04-28 DIAGNOSIS — R45851 Suicidal ideations: Secondary | ICD-10-CM | POA: Diagnosis present

## 2019-04-28 DIAGNOSIS — I471 Supraventricular tachycardia: Secondary | ICD-10-CM | POA: Diagnosis not present

## 2019-04-28 DIAGNOSIS — Z5320 Procedure and treatment not carried out because of patient's decision for unspecified reasons: Secondary | ICD-10-CM | POA: Diagnosis present

## 2019-04-28 DIAGNOSIS — Z79899 Other long term (current) drug therapy: Secondary | ICD-10-CM

## 2019-04-28 DIAGNOSIS — Z7902 Long term (current) use of antithrombotics/antiplatelets: Secondary | ICD-10-CM

## 2019-04-28 DIAGNOSIS — I480 Paroxysmal atrial fibrillation: Secondary | ICD-10-CM | POA: Diagnosis present

## 2019-04-28 DIAGNOSIS — F1910 Other psychoactive substance abuse, uncomplicated: Secondary | ICD-10-CM | POA: Diagnosis not present

## 2019-04-28 DIAGNOSIS — Z87891 Personal history of nicotine dependence: Secondary | ICD-10-CM

## 2019-04-28 DIAGNOSIS — E860 Dehydration: Secondary | ICD-10-CM | POA: Diagnosis present

## 2019-04-28 DIAGNOSIS — I4891 Unspecified atrial fibrillation: Secondary | ICD-10-CM | POA: Diagnosis not present

## 2019-04-28 DIAGNOSIS — B955 Unspecified streptococcus as the cause of diseases classified elsewhere: Secondary | ICD-10-CM | POA: Diagnosis not present

## 2019-04-28 DIAGNOSIS — H5500 Unspecified nystagmus: Secondary | ICD-10-CM | POA: Diagnosis not present

## 2019-04-28 DIAGNOSIS — R7881 Bacteremia: Secondary | ICD-10-CM | POA: Diagnosis not present

## 2019-04-28 DIAGNOSIS — M7542 Impingement syndrome of left shoulder: Secondary | ICD-10-CM | POA: Diagnosis present

## 2019-04-28 DIAGNOSIS — I2583 Coronary atherosclerosis due to lipid rich plaque: Secondary | ICD-10-CM

## 2019-04-28 DIAGNOSIS — Z7982 Long term (current) use of aspirin: Secondary | ICD-10-CM

## 2019-04-28 DIAGNOSIS — Z8249 Family history of ischemic heart disease and other diseases of the circulatory system: Secondary | ICD-10-CM

## 2019-04-28 DIAGNOSIS — X58XXXA Exposure to other specified factors, initial encounter: Secondary | ICD-10-CM | POA: Diagnosis present

## 2019-04-28 DIAGNOSIS — I517 Cardiomegaly: Secondary | ICD-10-CM | POA: Diagnosis present

## 2019-04-28 DIAGNOSIS — F14188 Cocaine abuse with other cocaine-induced disorder: Secondary | ICD-10-CM | POA: Diagnosis not present

## 2019-04-28 DIAGNOSIS — G4700 Insomnia, unspecified: Secondary | ICD-10-CM | POA: Diagnosis present

## 2019-04-28 DIAGNOSIS — Z720 Tobacco use: Secondary | ICD-10-CM | POA: Diagnosis present

## 2019-04-28 DIAGNOSIS — I69354 Hemiplegia and hemiparesis following cerebral infarction affecting left non-dominant side: Secondary | ICD-10-CM

## 2019-04-28 DIAGNOSIS — Z7984 Long term (current) use of oral hypoglycemic drugs: Secondary | ICD-10-CM

## 2019-04-28 DIAGNOSIS — Z915 Personal history of self-harm: Secondary | ICD-10-CM

## 2019-04-28 DIAGNOSIS — Z20828 Contact with and (suspected) exposure to other viral communicable diseases: Secondary | ICD-10-CM | POA: Diagnosis present

## 2019-04-28 DIAGNOSIS — F17210 Nicotine dependence, cigarettes, uncomplicated: Secondary | ICD-10-CM | POA: Diagnosis present

## 2019-04-28 DIAGNOSIS — S46001A Unspecified injury of muscle(s) and tendon(s) of the rotator cuff of right shoulder, initial encounter: Secondary | ICD-10-CM | POA: Diagnosis present

## 2019-04-28 DIAGNOSIS — F1410 Cocaine abuse, uncomplicated: Secondary | ICD-10-CM | POA: Diagnosis present

## 2019-04-28 DIAGNOSIS — B954 Other streptococcus as the cause of diseases classified elsewhere: Secondary | ICD-10-CM | POA: Diagnosis present

## 2019-04-28 DIAGNOSIS — Z955 Presence of coronary angioplasty implant and graft: Secondary | ICD-10-CM

## 2019-04-28 DIAGNOSIS — F1010 Alcohol abuse, uncomplicated: Secondary | ICD-10-CM | POA: Diagnosis present

## 2019-04-28 DIAGNOSIS — N179 Acute kidney failure, unspecified: Secondary | ICD-10-CM | POA: Diagnosis present

## 2019-04-28 DIAGNOSIS — D649 Anemia, unspecified: Secondary | ICD-10-CM | POA: Diagnosis present

## 2019-04-28 DIAGNOSIS — I10 Essential (primary) hypertension: Secondary | ICD-10-CM | POA: Diagnosis present

## 2019-04-28 DIAGNOSIS — I252 Old myocardial infarction: Secondary | ICD-10-CM

## 2019-04-28 DIAGNOSIS — I2510 Atherosclerotic heart disease of native coronary artery without angina pectoris: Secondary | ICD-10-CM | POA: Diagnosis present

## 2019-04-28 DIAGNOSIS — Z794 Long term (current) use of insulin: Secondary | ICD-10-CM

## 2019-04-28 DIAGNOSIS — K0889 Other specified disorders of teeth and supporting structures: Secondary | ICD-10-CM | POA: Diagnosis present

## 2019-04-28 DIAGNOSIS — R072 Precordial pain: Secondary | ICD-10-CM

## 2019-04-28 DIAGNOSIS — N289 Disorder of kidney and ureter, unspecified: Secondary | ICD-10-CM

## 2019-04-28 DIAGNOSIS — F332 Major depressive disorder, recurrent severe without psychotic features: Secondary | ICD-10-CM

## 2019-04-28 HISTORY — DX: Cerebral infarction, unspecified: I63.9

## 2019-04-28 LAB — COMPREHENSIVE METABOLIC PANEL
ALT: 12 U/L (ref 0–44)
AST: 13 U/L — ABNORMAL LOW (ref 15–41)
Albumin: 3.6 g/dL (ref 3.5–5.0)
Alkaline Phosphatase: 108 U/L (ref 38–126)
Anion gap: 13 (ref 5–15)
BUN: 43 mg/dL — ABNORMAL HIGH (ref 8–23)
CO2: 19 mmol/L — ABNORMAL LOW (ref 22–32)
Calcium: 9.1 mg/dL (ref 8.9–10.3)
Chloride: 105 mmol/L (ref 98–111)
Creatinine, Ser: 2.41 mg/dL — ABNORMAL HIGH (ref 0.61–1.24)
GFR calc Af Amer: 31 mL/min — ABNORMAL LOW (ref >60)
GFR calc non Af Amer: 27 mL/min — ABNORMAL LOW (ref >60)
Glucose, Bld: 180 mg/dL — ABNORMAL HIGH (ref 70–99)
Potassium: 3.5 mmol/L (ref 3.5–5.1)
Sodium: 137 mmol/L (ref 135–145)
Total Bilirubin: 0.5 mg/dL (ref 0.3–1.2)
Total Protein: 6.3 g/dL — ABNORMAL LOW (ref 6.5–8.1)

## 2019-04-28 LAB — GLUCOSE, CAPILLARY
Glucose-Capillary: 299 mg/dL — ABNORMAL HIGH (ref 70–99)
Glucose-Capillary: 161 mg/dL — ABNORMAL HIGH (ref 70–99)

## 2019-04-28 LAB — CBC WITH DIFFERENTIAL/PLATELET
Abs Immature Granulocytes: 0.03 10*3/uL (ref 0.00–0.07)
Basophils Absolute: 0.1 10*3/uL (ref 0.0–0.1)
Basophils Relative: 1 %
Eosinophils Absolute: 0.4 10*3/uL (ref 0.0–0.5)
Eosinophils Relative: 3 %
HCT: 31.1 % — ABNORMAL LOW (ref 39.0–52.0)
Hemoglobin: 10.6 g/dL — ABNORMAL LOW (ref 13.0–17.0)
Immature Granulocytes: 0 %
Lymphocytes Relative: 17 %
Lymphs Abs: 1.9 10*3/uL (ref 0.7–4.0)
MCH: 33.2 pg (ref 26.0–34.0)
MCHC: 34.1 g/dL (ref 30.0–36.0)
MCV: 97.5 fL (ref 80.0–100.0)
Monocytes Absolute: 1 10*3/uL (ref 0.1–1.0)
Monocytes Relative: 9 %
Neutro Abs: 7.9 10*3/uL — ABNORMAL HIGH (ref 1.7–7.7)
Neutrophils Relative %: 70 %
Platelets: 298 10*3/uL (ref 150–400)
RBC: 3.19 MIL/uL — ABNORMAL LOW (ref 4.22–5.81)
RDW: 12.1 % (ref 11.5–15.5)
WBC: 11.3 10*3/uL — ABNORMAL HIGH (ref 4.0–10.5)
nRBC: 0 % (ref 0.0–0.2)

## 2019-04-28 LAB — ETHANOL: Alcohol, Ethyl (B): <10 mg/dL (ref <10)

## 2019-04-28 LAB — RAPID URINE DRUG SCREEN, HOSP PERFORMED
Amphetamines: NOT DETECTED
Barbiturates: NOT DETECTED
Benzodiazepines: POSITIVE — AB
Cocaine: POSITIVE — AB
Opiates: NOT DETECTED
Tetrahydrocannabinol: NOT DETECTED

## 2019-04-28 LAB — CBG MONITORING, ED: Glucose-Capillary: 180 mg/dL — ABNORMAL HIGH (ref 70–99)

## 2019-04-28 LAB — URINALYSIS, ROUTINE W REFLEX MICROSCOPIC
Bilirubin Urine: NEGATIVE
Glucose, UA: NEGATIVE mg/dL
Hgb urine dipstick: NEGATIVE
Ketones, ur: NEGATIVE mg/dL
Leukocytes,Ua: NEGATIVE
Nitrite: NEGATIVE
Protein, ur: NEGATIVE mg/dL
Specific Gravity, Urine: 1.016 (ref 1.005–1.030)
pH: 5 (ref 5.0–8.0)

## 2019-04-28 LAB — TROPONIN I
Troponin I: <0.03 ng/mL (ref <0.03)
Troponin I: <0.03 ng/mL (ref <0.03)
Troponin I: <0.03 ng/mL (ref <0.03)

## 2019-04-28 LAB — PHOSPHORUS: Phosphorus: 2.3 mg/dL — ABNORMAL LOW (ref 2.5–4.6)

## 2019-04-28 LAB — CK: Total CK: 99 U/L (ref 49–397)

## 2019-04-28 LAB — LIPASE, BLOOD: Lipase: 27 U/L (ref 11–51)

## 2019-04-28 LAB — MRSA PCR SCREENING: MRSA by PCR: NEGATIVE

## 2019-04-28 LAB — SARS CORONAVIRUS 2 BY RT PCR (HOSPITAL ORDER, PERFORMED IN ~~LOC~~ HOSPITAL LAB): SARS Coronavirus 2: NEGATIVE

## 2019-04-28 LAB — MAGNESIUM: Magnesium: 1.9 mg/dL (ref 1.7–2.4)

## 2019-04-28 MED ORDER — HEPARIN SODIUM (PORCINE) 5000 UNIT/ML IJ SOLN
5000.0000 [IU] | Freq: Three times a day (TID) | INTRAMUSCULAR | Status: DC
  Administered 2019-04-28 – 2019-05-09 (×31): 5000 [IU] via SUBCUTANEOUS
  Filled 2019-04-28 (×30): qty 1

## 2019-04-28 MED ORDER — INSULIN ASPART 100 UNIT/ML ~~LOC~~ SOLN
0.0000 [IU] | Freq: Every day | SUBCUTANEOUS | Status: DC
  Administered 2019-04-30 – 2019-05-06 (×2): 2 [IU] via SUBCUTANEOUS

## 2019-04-28 MED ORDER — CLOPIDOGREL BISULFATE 75 MG PO TABS
75.0000 mg | ORAL_TABLET | Freq: Every day | ORAL | Status: DC
  Administered 2019-04-28 – 2019-05-01 (×4): 75 mg via ORAL
  Filled 2019-04-28 (×3): qty 1

## 2019-04-28 MED ORDER — PANTOPRAZOLE SODIUM 40 MG IV SOLR
40.0000 mg | Freq: Once | INTRAVENOUS | Status: AC
  Administered 2019-04-28: 40 mg via INTRAVENOUS
  Filled 2019-04-28: qty 40

## 2019-04-28 MED ORDER — LACTATED RINGERS IV SOLN
INTRAVENOUS | Status: AC
  Administered 2019-04-28: 16:00:00 via INTRAVENOUS

## 2019-04-28 MED ORDER — SODIUM CHLORIDE 0.9 % IV BOLUS
1000.0000 mL | Freq: Once | INTRAVENOUS | Status: AC
  Administered 2019-04-28: 1000 mL via INTRAVENOUS

## 2019-04-28 MED ORDER — RAMELTEON 8 MG PO TABS
8.0000 mg | ORAL_TABLET | Freq: Every day | ORAL | Status: DC
  Administered 2019-04-28 – 2019-05-10 (×13): 8 mg via ORAL
  Filled 2019-04-28 (×15): qty 1

## 2019-04-28 MED ORDER — THIAMINE HCL 100 MG/ML IJ SOLN
100.0000 mg | Freq: Every day | INTRAMUSCULAR | Status: DC
  Administered 2019-04-28: 100 mg via INTRAVENOUS
  Filled 2019-04-28: qty 2

## 2019-04-28 MED ORDER — DILTIAZEM HCL 60 MG PO TABS
30.0000 mg | ORAL_TABLET | Freq: Four times a day (QID) | ORAL | Status: DC
  Administered 2019-04-28 – 2019-04-30 (×8): 30 mg via ORAL
  Filled 2019-04-28 (×8): qty 1

## 2019-04-28 MED ORDER — PANTOPRAZOLE SODIUM 40 MG PO TBEC
40.0000 mg | DELAYED_RELEASE_TABLET | Freq: Every day | ORAL | Status: DC
  Administered 2019-04-29 – 2019-05-08 (×10): 40 mg via ORAL
  Filled 2019-04-28 (×9): qty 1

## 2019-04-28 MED ORDER — ACETAMINOPHEN 650 MG RE SUPP
650.0000 mg | Freq: Four times a day (QID) | RECTAL | Status: DC | PRN
  Administered 2019-05-11: 04:00:00 650 mg via RECTAL

## 2019-04-28 MED ORDER — FOLIC ACID 5 MG/ML IJ SOLN
1.0000 mg | Freq: Every day | INTRAMUSCULAR | Status: DC
  Administered 2019-04-28: 1 mg via INTRAVENOUS
  Filled 2019-04-28 (×4): qty 0.2

## 2019-04-28 MED ORDER — INSULIN ASPART 100 UNIT/ML ~~LOC~~ SOLN
0.0000 [IU] | Freq: Three times a day (TID) | SUBCUTANEOUS | Status: DC
  Administered 2019-04-29: 7 [IU] via SUBCUTANEOUS
  Administered 2019-04-30: 5 [IU] via SUBCUTANEOUS
  Administered 2019-05-01: 3 [IU] via SUBCUTANEOUS
  Administered 2019-05-01: 5 [IU] via SUBCUTANEOUS
  Administered 2019-05-01: 2 [IU] via SUBCUTANEOUS
  Administered 2019-05-02: 06:00:00 3 [IU] via SUBCUTANEOUS
  Administered 2019-05-02 (×2): 5 [IU] via SUBCUTANEOUS
  Administered 2019-05-03: 12:00:00 3 [IU] via SUBCUTANEOUS
  Administered 2019-05-03 – 2019-05-04 (×2): 2 [IU] via SUBCUTANEOUS
  Administered 2019-05-04: 13:00:00 3 [IU] via SUBCUTANEOUS
  Administered 2019-05-05 (×2): 5 [IU] via SUBCUTANEOUS
  Administered 2019-05-05: 07:00:00 3 [IU] via SUBCUTANEOUS
  Administered 2019-05-06: 17:00:00 9 [IU] via SUBCUTANEOUS
  Administered 2019-05-06 – 2019-05-07 (×2): 2 [IU] via SUBCUTANEOUS
  Administered 2019-05-07: 5 [IU] via SUBCUTANEOUS
  Administered 2019-05-07: 07:00:00 2 [IU] via SUBCUTANEOUS
  Administered 2019-05-08: 3 [IU] via SUBCUTANEOUS
  Administered 2019-05-08: 1 [IU] via SUBCUTANEOUS
  Administered 2019-05-09: 2 [IU] via SUBCUTANEOUS
  Administered 2019-05-09: 3 [IU] via SUBCUTANEOUS
  Administered 2019-05-09: 17:00:00 7 [IU] via SUBCUTANEOUS
  Administered 2019-05-10: 17:00:00 2 [IU] via SUBCUTANEOUS
  Administered 2019-05-10: 13:00:00 5 [IU] via SUBCUTANEOUS
  Administered 2019-05-11 (×3): 2 [IU] via SUBCUTANEOUS

## 2019-04-28 MED ORDER — LORAZEPAM 2 MG/ML IJ SOLN
2.0000 mg | INTRAMUSCULAR | Status: DC | PRN
  Administered 2019-04-30 – 2019-05-03 (×3): 2 mg via INTRAVENOUS
  Filled 2019-04-28 (×3): qty 1

## 2019-04-28 MED ORDER — ATORVASTATIN CALCIUM 40 MG PO TABS
40.0000 mg | ORAL_TABLET | Freq: Every day | ORAL | Status: DC
  Administered 2019-04-28 – 2019-05-11 (×14): 40 mg via ORAL
  Filled 2019-04-28 (×14): qty 1

## 2019-04-28 MED ORDER — ACETAMINOPHEN 325 MG PO TABS
650.0000 mg | ORAL_TABLET | Freq: Four times a day (QID) | ORAL | Status: DC | PRN
  Administered 2019-05-02 – 2019-05-11 (×23): 650 mg via ORAL
  Filled 2019-04-28 (×26): qty 2

## 2019-04-28 NOTE — ED Notes (Signed)
Pt resting.

## 2019-04-28 NOTE — ED Provider Notes (Signed)
[facility] EMERGENCY DEPARTMENT Provider Note   CSN: [id number] Arrival date & time: 04/28/19  0709    History   Chief Complaint Chief Complaint  Patient presents with   Chest Pain    HPI William Gonzalez is a 63 y.o. male.     The history is provided by the patient and medical records. No language interpreter was used.  Chest Pain  Pain location:  Substernal area and L chest Pain quality: crushing and pressure   Pain radiates to:  Does not radiate Pain severity:  Moderate Onset quality:  Gradual Duration:  1 week Timing:  Intermittent Progression:  Waxing and waning Chronicity:  Recurrent Relieved by:  Nitroglycerin and aspirin Worsened by:  Exertion Ineffective treatments:  None tried Associated symptoms: weakness (at baseline)   Associated symptoms: no abdominal pain, no anxiety, no back pain, no claudication, no cough, no diaphoresis, no dizziness, no fatigue, no fever, no headache, no lower extremity edema, no nausea, no near-syncope, no palpitations, no shortness of breath, no syncope and no vomiting   Risk factors: coronary artery disease, diabetes mellitus, hypertension and male sex   Risk factors: no prior DVT/PE     Past Medical History:  Diagnosis Date   Alcohol abuse    Coronary artery disease    Diabetes mellitus    Hypertension    Stented coronary artery    Tobacco abuse     Patient Active Problem List   Diagnosis Date Noted   MDD (major depressive disorder), recurrent episode (HCC) 04/21/2019   Suicidal ideations    Alcohol dependence (HCC) 04/13/2019   MDD (major depressive disorder), recurrent severe, without psychosis (HCC) 04/08/2019   Anemia 04/18/2018   Ataxic gait    Cocaine abuse (HCC)    Cerebellar infarct (HCC) 04/21/2015   Tobacco abuse 04/21/2015   Alcohol abuse 04/21/2015   Stroke (HCC) 04/21/2015   Diabetes mellitus without complication (HCC) 04/21/2015   Coronary artery disease     Hypertension     Past Surgical History:  Procedure Laterality Date   CORONARY STENT PLACEMENT          Home Medications    Prior to Admission medications   Medication Sig Start Date End Date Taking? Authorizing Provider  amantadine (SYMMETREL) 100 MG capsule Take 1 capsule (100 mg total) by mouth 2 (two) times daily. For cravings 04/13/19   Aldean Baker, NP  amLODipine (NORVASC) 10 MG tablet Take 1 tablet (10 mg total) by mouth daily. 04/22/18   Burnadette Pop, MD  ARIPiprazole (ABILIFY) 2 MG tablet Take 1 tablet (2 mg total) by mouth daily for 30 days. 04/21/19 05/21/19  Long, Arlyss Repress, MD  atorvastatin (LIPITOR) 40 MG tablet Take 1 tablet (40 mg total) by mouth daily at 6 PM. 04/21/18   Burnadette Pop, MD  carvedilol (COREG) 25 MG tablet Take 25 mg by mouth 2 (two) times a day. If sbp less than 110 or heart rate 55    [provider]  clopidogrel (PLAVIX) 75 MG tablet Take 1 tablet (75 mg total) by mouth daily. 04/22/18   Burnadette Pop, MD  diclofenac sodium (VOLTAREN) 1 % GEL Apply 2 g topically 3 (three) times daily as needed (pain).    [provider]  DULoxetine (CYMBALTA) 30 MG capsule Take 60 mg by mouth every morning.    [provider]  ferrous sulfate 325 (65 FE) MG tablet Take 325 mg by mouth See admin instructions. Monday Wednesday Friday  [provider]  glipiZIDE (GLUCOTROL) 10 MG tablet Take 10 mg by mouth 2 (two) times daily before a meal.    [provider]  glucose 4 GM chewable tablet Chew 4 tablets by mouth as needed for low blood sugar.    [provider]  latanoprost (XALATAN) 0.005 % ophthalmic solution Place 1 drop into both eyes at bedtime.    [provider]  lisinopril (PRINIVIL,ZESTRIL) 20 MG tablet Take 1 tablet (20 mg total) by mouth daily. 04/21/18   Burnadette Pop, MD  Menthol, Topical Analgesic, (BIOFREEZE ROLL-ON) 4 % GEL Apply 1 application topically daily as needed (pain).    [provider]  metFORMIN (GLUCOPHAGE) 1000 MG tablet Take 1 tablet (1,000 mg total) by mouth 2 (two) times daily with a meal. Patient not taking: Reported on 04/20/2019 07/04/12   Molpus, Jonny Ruiz, MD  metFORMIN (GLUCOPHAGE) 500 MG tablet Take 1,000 mg by mouth 2 (two) times daily with a meal.    [provider]  metoprolol succinate (TOPROL-XL) 50 MG 24 hr tablet Take 1 tablet (50 mg total) by mouth daily. Take with or immediately following a meal for high blood pressure 04/14/19   Aldean Baker, NP  nicotine (NICODERM CQ - DOSED IN MG/24 HOURS) 21 mg/24hr patch Place 1 patch (21 mg total) onto the skin daily. For smoking cessation Patient not taking: Reported on 04/20/2019 04/14/19   Aldean Baker, NP  omeprazole (PRILOSEC) 20 MG capsule Take 20 mg by mouth daily.    [provider]  vortioxetine HBr (TRINTELLIX) 10 MG TABS tablet Take 1 tablet (10 mg total) by mouth daily. For mood 04/14/19   Aldean Baker, NP    Family History Family History  Problem Relation Age of Onset   Hypertension Mother    Diabetes Mother    Hypertension Father    Diabetes Father    Hypertension Sister    Hypertension Brother    Diabetes Sister    Diabetes Brother     Social History Social History   Tobacco Use   Smoking status: Current Every Day Smoker    Packs/day: 0.30    Types: Cigarettes   Smokeless tobacco: Never Used  Substance Use Topics   Alcohol use: Yes    Alcohol/week: 1.0 standard drinks    Types: 1 Cans of beer per week    Comment: occassionally    Drug use: Yes    Types: Cocaine     Allergies   Iodine   Review of Systems Review of Systems  Constitutional: Negative for chills, diaphoresis, fatigue and fever.  HENT: Negative for congestion.   Eyes: Negative for visual disturbance.  Respiratory: Positive for chest tightness. Negative for cough, shortness of breath, wheezing and stridor.   Cardiovascular: Positive for chest pain. Negative for  palpitations, claudication, leg swelling, syncope and near-syncope.  Gastrointestinal: Negative for abdominal pain, constipation, diarrhea, nausea and vomiting.  Genitourinary: Negative for dysuria and frequency.  Musculoskeletal: Negative for back pain, neck pain and neck stiffness.  Skin: Negative for rash.  Neurological: Positive for weakness (at baseline). Negative for dizziness, light-headedness and headaches.  Psychiatric/Behavioral: Negative for agitation and confusion.  All other systems reviewed and are negative.    Physical Exam Updated Vital Signs Temp 97.9 F (36.6 C) (Oral)    Ht  (1.651 m)    Wt 72.6 kg    BMI 26.63 kg/m   Physical Exam Vitals signs and nursing note reviewed.  Constitutional:  General: He is not in acute distress.    Appearance: He is well-developed. He is not ill-appearing, toxic-appearing or diaphoretic.  HENT:     Head: Normocephalic and atraumatic.  Eyes:     Conjunctiva/sclera: Conjunctivae normal.     Pupils: Pupils are equal, round, and reactive to light.  Neck:     Musculoskeletal: Neck supple.  Cardiovascular:     Rate and Rhythm: Normal rate and regular rhythm.     Heart sounds: Normal heart sounds. No murmur.  Pulmonary:     Effort: Pulmonary effort is normal. No tachypnea or respiratory distress.     Breath sounds: Normal breath sounds. No decreased breath sounds, wheezing, rhonchi or rales.  Chest:     Chest wall: No tenderness.  Abdominal:     Palpations: Abdomen is soft.     Tenderness: There is no abdominal tenderness.  Musculoskeletal:     Right lower leg: He exhibits no tenderness. No edema.     Left lower leg: He exhibits no tenderness. No edema.  Skin:    General: Skin is warm and dry.     Capillary Refill: Capillary refill takes less than 2 seconds.     Findings: No erythema.  Neurological:     General: No focal deficit present.     Mental Status: He is alert and oriented to person, place, and time. Mental  status is at baseline.     Cranial Nerves: Cranial nerves are intact.     Motor: Weakness present.     Comments: Mild L arm and leg weakness compared to R. At baseline per pt report.   Psychiatric:        Mood and Affect: Mood normal.      ED Treatments / Results  Labs (all labs ordered are listed, but only abnormal results are displayed) Labs Reviewed  CBC WITH DIFFERENTIAL/PLATELET - Abnormal; Notable for the following components:      Result Value   WBC 11.3 (*)    RBC 3.19 (*)    Hemoglobin 10.6 (*)    HCT 31.1 (*)    Neutro Abs 7.9 (*)    All other components within normal limits  COMPREHENSIVE METABOLIC PANEL - Abnormal; Notable for the following components:   CO2 19 (*)    Glucose, Bld 180 (*)    BUN 43 (*)    Creatinine, Ser 2.41 (*)    Total Protein 6.3 (*)    AST 13 (*)    GFR calc non Af Amer 27 (*)    GFR calc Af Amer 31 (*)    All other components within normal limits  RAPID URINE DRUG SCREEN, HOSP PERFORMED - Abnormal; Notable for the following components:   Cocaine POSITIVE (*)    Benzodiazepines POSITIVE (*)    All other components within normal limits  GLUCOSE, CAPILLARY - Abnormal; Notable for the following components:   Glucose-Capillary 299 (*)    All other components within normal limits  CBG MONITORING, ED - Abnormal; Notable for the following components:   Glucose-Capillary 180 (*)    All other components within normal limits  SARS CORONAVIRUS 2 (HOSPITAL ORDER, PERFORMED IN [facility])  MRSA PCR SCREENING  LIPASE, BLOOD  TROPONIN I  TROPONIN I  ETHANOL  URINALYSIS, ROUTINE W REFLEX MICROSCOPIC  CK  TROPONIN I  TROPONIN I  MAGNESIUM  PHOSPHORUS    EKG EKG Interpretation  Date/Time:  Tuesday Apr 28 2019 07:18:31 EDT Ventricular Rate:  76 PR Interval:  QRS Duration: 115 QT Interval:  394 QTC Calculation: 443 R Axis:   -75 Text Interpretation:  Sinus rhythm Left anterior fascicular block Probable left ventricular  hypertrophy Anterior Q waves, possibly due to LVH When compared to prior, t wave inversions have improved.  No STEMI Confirmed by Theda Belfast ([id number]) on 04/28/2019 7:37:13 AM   Radiology Dg Chest 2 View  Result Date: 04/28/2019 CLINICAL DATA:  Central chest pain and pressure for 2 weeks. EXAM: CHEST - 2 VIEW COMPARISON:  04/20/2019 FINDINGS: The heart size and mediastinal contours are within normal limits. Both lungs are clear. The visualized skeletal structures are unremarkable. Tortuous aorta. IMPRESSION: No active cardiopulmonary disease.  No change from prior. Electronically Signed   By: Elsie Stain M.D.   On: 04/28/2019 08:07    Procedures Procedures (including critical care time)  Medications Ordered in ED Medications  heparin injection 5,000 Units (has no administration in time range)  acetaminophen (TYLENOL) tablet 650 mg (has no administration in time range)    Or  acetaminophen (TYLENOL) suppository 650 mg (has no administration in time range)  folic acid injection 1 mg (has no administration in time range)  thiamine (B-1) injection 100 mg (100 mg Intravenous Given 04/28/19 1603)  LORazepam (ATIVAN) injection 2-3 mg (has no administration in time range)  atorvastatin (LIPITOR) tablet 40 mg (40 mg Oral Given 04/28/19 1605)  clopidogrel (PLAVIX) tablet 75 mg (75 mg Oral Given 04/28/19 1605)  pantoprazole (PROTONIX) EC tablet 40 mg (has no administration in time range)  lactated ringers infusion ( Intravenous New Bag/Given 04/28/19 1600)  insulin aspart (novoLOG) injection 0-9 Units (0 Units Subcutaneous Not Given 04/28/19 1625)  insulin aspart (novoLOG) injection 0-5 Units (has no administration in time range)  ramelteon (ROZEREM) tablet 8 mg (has no administration in time range)  sodium chloride 0.9 % bolus 1,000 mL (0 mLs Intravenous Stopped 04/28/19 1434)  pantoprazole (PROTONIX) injection 40 mg (40 mg Intravenous Given 04/28/19 1602)     Initial Impression / Assessment and Plan / ED  Course  I have reviewed the triage vital signs and the nursing notes.  Pertinent labs & imaging results that were available during my care of the patient were reviewed by me and considered in my medical decision making (see chart for details).  Clinical Course as of Apr 28 1635  Tue Apr 28, 2019  1228 Creatinine(!): 2.41 [CG]  1228 BUN(!): 43 [CG]  1228 May be falsely high given hemoconcentration / dehydration   Hemoglobin(!): 10.6 [CG]  1228 HCT(!): 31.1 [CG]  1231 Spoke to Dr Allyson Sabal with cardiology recommends admission by medicine team for detox, AKI and cardiology will follow.     [CG]    Clinical Course User Index [CG] Liberty Handy, PA-C       William Gonzalez is a 63 y.o. male with a past medical history significant for prior stroke with residual left-sided deficits, CAD with MI and PCI, diabetes, hypertension, and polysubstance abuse who presents with chest pain.  He reports that for the last week he has been having intermittent chest pain.  He reports it feels like his prior heart attack.  He reports it is a pressure in his central left chest that does not radiate.  He reports it is worsened with exertion.  He reports that it was up to a 7 out of 10 in severity this morning prompting him to call for help.  He does report the last 3 days he has been drinking a significant  of alcohol "as much as he can get" as well as using cocaine.  He says that this pain was completely resolved with nitroglycerin and aspirin provided by EMS.  He currently is a 0 out of 10 in severity.  He reports no associated nausea vomiting, diaphoresis.  He denies significant shortness of breath or syncope.  He denies any new leg pain or leg swelling and no history of DVT or PE.  He denies any trauma over the last several days and is primarily concerned he is having a heart attack.  On exam, lungs are clear and chest is nontender.  Abdomen is nontender.  Extremities are nontender.  Symmetric radial pulses.   Symmetric lower extremity pulses.  Patient has residual left-sided arm and leg weakness which he reports is unchanged from his baseline.  Patient is alert and oriented.  Initial EKG shows no STEMI.  Given patient's history of CAD with PCI and his report that he is having the same chest pain, patient will need a cardiac work-up.  He will have troponins, chest x-ray, and labs.  Patient already deceived aspirin and nitroglycerin with EMS and is now chest pain-free.  Anticipate touching base with cardiology after work-up.  Cocaine associated chest pain may be the cause however with his history of MI, will do further work-up.    Cardiology was called after delta troponin was negative.  They will come see patient.  Acute kidney injury was found.  Due to this, they recommended admission to hospital under unassigned service and they will see in consultation.  Patient will be admitted.   Final Clinical Impressions(s) / ED Diagnoses   Final diagnoses:  Precordial pain  AKI (acute kidney injury) (HCC)     Clinical Impression: 1. Precordial pain   2. AKI (acute kidney injury) (HCC)     Disposition: Admit  This note was prepared with assistance of Dragon voice recognition software. Occasional wrong-word or sound-a-like substitutions may have occurred due to the inherent limitations of voice recognition software.      Naomii Kreger, Canary Brim, MD 04/28/19 [phone]

## 2019-04-28 NOTE — Progress Notes (Signed)
Heart rate went up to 130's-150's afib , on movement,  Asymptomatic. MD aware with order.

## 2019-04-28 NOTE — ED Notes (Signed)
Pt given [place] sandwich bag lunch, okay per RN

## 2019-04-28 NOTE — ED Provider Notes (Addendum)
1115: Patient under initial care of Dr Rush Landmark now getting placed in progressive care pending cardiology evaluation.  See previous note for full details.  Briefly, pt presenting for intermittent CP feeling similar to previous MI worse with exertion relieved by nitroglycerin and aspirin by EMS. Cardiac work up including EKG, CXR, trop x 2 unremarkable.  Acute kidney injury on labs.   1200: Evaluated patient. He has no CP.  Drinks "as much as I can" daily, last drink last night. Snorts "a lot" of cocaine daily for last week, but cocaine is the only thing that makes CP better. He denies withdrawal symptoms including nausea, vomiting, tremors, seizures in the past.  Physical Exam  BP (!) 142/51 (BP Location: Left Arm)   Pulse 66   Temp 97.9 F (36.6 C) (Oral)   Resp 16   Ht 5\' 5"  (1.651 m)   Wt 72.6 kg   SpO2 100%   BMI 26.63 kg/m   Physical Exam Constitutional:      Appearance: He is well-developed. He is not toxic-appearing.  HENT:     Head: Normocephalic.     Right Ear: External ear normal.     Left Ear: External ear normal.     Nose: Nose normal.  Eyes:     Conjunctiva/sclera: Conjunctivae normal.  Neck:     Musculoskeletal: Full passive range of motion without pain.  Cardiovascular:     Rate and Rhythm: Normal rate and regular rhythm.  Pulmonary:     Effort: Pulmonary effort is normal. No respiratory distress.  Musculoskeletal: Normal range of motion.  Skin:    General: Skin is warm and dry.     Capillary Refill: Capillary refill takes less than 2 seconds.  Neurological:     Mental Status: He is alert and oriented to person, place, and time.  Psychiatric:        Behavior: Behavior normal.        Thought Content: Thought content normal.     ED Course/Procedures   Clinical Course as of Apr 27 1240  Tue Apr 28, 2019  1228 Creatinine(!): 2.41 [CG]  1228 BUN(!): 43 [CG]  1228 May be falsely high given hemoconcentration / dehydration   Hemoglobin(!): 10.6 [CG]  1228  HCT(!): 31.1 [CG]  1231 Spoke to Dr Allyson Sabal with cardiology recommends admission by medicine team for detox, AKI and cardiology will follow.     [CG]    Clinical Course User Index [CG] Liberty Handy, PA-C    Procedures  MDM   1240: H/o NSTEMI s/p stent on plavix and aspirin with CP.  Per cardiology, recommend admission by medicine and they will follow.  Will consult unassigned for admission for acute kidney injury likely 2/2 dehydration and further work up for chest pain.  Hgb 12.3>10.6 in last week but he denies melena. No h/o ulcers or GIB per patient.  1408: Spoke to IM who has accepted patient.   Addendum 1625: Lab is down, SARS test negative.    Liberty Handy, PA-C 04/28/19 1408    Liberty Handy, PA-C 04/28/19 1625    Tegeler, Canary Brim, MD 04/28/19 [phone]

## 2019-04-28 NOTE — ED Triage Notes (Signed)
Pt from home, brought in by ems; c/o central cp, pressure x 2 weeks; denies sob n/v, radiation; denies sick contacts; denies cough, fevers; hx strokes (residual L sided weakness), 4 stents; denies taking prescribed meds x 3 days "because he feels like they're not working"; pt states he drinks "as much as he can get" on a daily basis (etoh); last drink last night; PTA pt given 324 ASA, 1 nitro w/ relief; denies CP at this time; endorses cocaine use (1/2 gram last night)  131/66 HR 76 RR 16 100% RA T 97.16F CBG 213

## 2019-04-28 NOTE — ED Notes (Signed)
RN gave report to [place], Charity fundraiser on floor

## 2019-04-28 NOTE — ED Notes (Signed)
Pt aware that a urine sample is needed; urinal at bedside 

## 2019-04-28 NOTE — ED Notes (Signed)
Pt provided with water

## 2019-04-28 NOTE — Consult Note (Signed)
Cardiology Consultation:   Patient ID: William Gonzalez MRN: [id number]; DOB: Sep 20, 1956  Admit date: 04/28/2019 Date of Consult: 04/28/2019  Primary Care Provider: Cain SaupeFulp, Cammie, MD Primary [facility]ologist[facility] Dr. Allyson SabalBerry - new Primary Electrophysiologist:  None    Patient Profile:   William Gonzalez is a 63 y.o. male with a hx of CAD, HTN, DM, alcohol abuse, cocaine abuse, and ongoing tobacco use, MDD, hx of stroke with residual left-sided deficits who is being seen today for the evaluation of chest pain at the request of Dr. Rush Landmarkegeler.  History of Present Illness:   William Gonzalez has known CAD and polysubstance abuse [facility] with onset of chest pain concerning for angina. He had NSTEMI 03/24/2012 treated with DES x 2 [facility]CA [facility]. He was treated with ASA and plavix. [facility] for preoperative evaluation. He has been maintained on plavix since his stroke. He was cleared for surgery without further testing.  [facility] this morning with complaints of chest pain for the past 2 weeks. The chest pain is located in the center of his chest without radiation. He denies nausea, vomiting, and diaphoresis. He is concerned because the CP feels like his prior MI.   He received ASA and nitro with EMS which relieved his chest pain. He drinks alcohol nearly daily and used 0.5 g cocaine last night.   Cardiology consulted for chest pain evaluation. Troponin x 2  negative. EKG with sinus rhythm, LVH, and Q waves anterior leads.    Past Medical History:  Diagnosis Date   Alcohol abuse    Coronary artery disease    Diabetes mellitus    Hypertension    Stented coronary artery    Stroke (HCC)    Tobacco abuse     Past Surgical History:  Procedure Laterality Date   CORONARY STENT PLACEMENT       Home Medications:  Prior to Admission medications   Medication Sig Start Date End Date Taking?  Authorizing Provider  amantadine (SYMMETREL) 100 MG capsule Take 1 capsule (100 mg total) by mouth 2 (two) times daily. For cravings 04/13/19   Aldean BakerSykes, Janet E, NP  amLODipine (NORVASC) 10 MG tablet Take 1 tablet (10 mg total) by mouth daily. 04/22/18   Burnadette PopAdhikari, Amrit, MD  ARIPiprazole (ABILIFY) 2 MG tablet Take 1 tablet (2 mg total) by mouth daily for 30 days. 04/21/19 05/21/19  Long, Arlyss RepressJoshua G, MD  atorvastatin (LIPITOR) 40 MG tablet Take 1 tablet (40 mg total) by mouth daily at 6 PM. 04/21/18   Burnadette PopAdhikari, Amrit, MD  carvedilol (COREG) 25 MG tablet Take 25 mg by mouth 2 (two) times a day. If sbp less than 110 or heart rate 55    [provider]  clopidogrel (PLAVIX) 75 MG tablet Take 1 tablet (75 mg total) by mouth daily. 04/22/18   Burnadette PopAdhikari, Amrit, MD  diclofenac sodium (VOLTAREN) 1 % GEL Apply 2 g topically 3 (three) times daily as needed (pain).    [provider]  DULoxetine (CYMBALTA) 30 MG capsule Take 60 mg by mouth every morning.    [provider]  ferrous sulfate 325 (65 FE) MG tablet Take 325 mg by mouth See admin instructions. Monday Wednesday Friday    [provider]  glipiZIDE (GLUCOTROL) 10 MG tablet Take 10 mg by mouth 2 (two) times daily before a meal.    [provider]  glucose 4 GM  chewable tablet Chew 4 tablets by mouth as needed for low blood sugar.    [provider]  latanoprost (XALATAN) 0.005 % ophthalmic solution Place 1 drop into both eyes at bedtime.    [provider]  lisinopril (PRINIVIL,ZESTRIL) 20 MG tablet Take 1 tablet (20 mg total) by mouth daily. 04/21/18   Burnadette Pop, MD  Menthol, Topical Analgesic, (BIOFREEZE ROLL-ON) 4 % GEL Apply 1 application topically daily as needed (pain).    [provider]  metFORMIN (GLUCOPHAGE) 1000 MG tablet Take 1 tablet (1,000 mg total) by mouth 2 (two) times daily with a meal. Patient not taking: Reported on 04/20/2019 07/04/12   Molpus, Jonny Ruiz, MD  metFORMIN  (GLUCOPHAGE) 500 MG tablet Take 1,000 mg by mouth 2 (two) times daily with a meal.    [provider]  metoprolol succinate (TOPROL-XL) 50 MG 24 hr tablet Take 1 tablet (50 mg total) by mouth daily. Take with or immediately following a meal for high blood pressure 04/14/19   Aldean Baker, NP  nicotine (NICODERM CQ - DOSED IN MG/24 HOURS) 21 mg/24hr patch Place 1 patch (21 mg total) onto the skin daily. For smoking cessation Patient not taking: Reported on 04/20/2019 04/14/19   Aldean Baker, NP  omeprazole (PRILOSEC) 20 MG capsule Take 20 mg by mouth daily.    [provider]  vortioxetine HBr (TRINTELLIX) 10 MG TABS tablet Take 1 tablet (10 mg total) by mouth daily. For mood 04/14/19   Aldean Baker, NP    Inpatient Medications: Scheduled Meds:  Continuous Infusions:  sodium chloride     PRN Meds:   Allergies:    Allergies  Allergen Reactions   Iodine Anaphylaxis and Swelling    Swells all over    Social History:   Social History   Socioeconomic History   Marital status: Divorced    Spouse name: Not on file   Number of children: Not on file   Years of education: Not on file   Highest education level: Not on file  Occupational History   Not on file  Social Needs   Financial resource strain: Not on file   Food insecurity:    Worry: Not on file    Inability: Not on file   Transportation needs:    Medical: Not on file    Non-medical: Not on file  Tobacco Use   Smoking status: Current Every Day Smoker    Packs/day: 0.30    Types: Cigarettes   Smokeless tobacco: Never Used  Substance and Sexual Activity   Alcohol use: Yes    Comment: "as much as i can get"   Drug use: Yes    Types: Cocaine   Sexual activity: Not Currently  Lifestyle   Physical activity:    Days per week: Not on file    Minutes per session: Not on file   Stress: Not on file  Relationships   Social connections:    Talks on phone: Not on file    Gets together:  Not on file    Attends religious service: Not on file    Active member of club or organization: Not on file    Attends meetings of clubs or organizations: Not on file    Relationship status: Not on file   Intimate partner violence:    Fear of current or ex partner: Not on file    Emotionally abused: Not on file    Physically abused: Not on file  Forced sexual activity: Not on file  Other Topics Concern   Not on file  Social History Narrative   Not on file    Family History:    Family History  Problem Relation Age of Onset   Hypertension Mother    Diabetes Mother    Hypertension Father    Diabetes Father    Hypertension Sister    Hypertension Brother    Diabetes Sister    Diabetes Brother      ROS:  Please see the history of present illness.   All other ROS reviewed and negative.     Physical Exam/Data:   Vitals:   04/28/19 0930 04/28/19 0945 04/28/19 1000 04/28/19 1115  BP: (!) 102/54 (!) 112/55 (!) 105/52   Pulse:   64   Resp: Temp:      TempSrc:      SpO2:   100%   Weight:      Height:       No intake or output data in the 24 hours ending 04/28/19 1127 Last 3 Weights 04/28/2019 04/20/2019 04/08/2019  Weight (lbs) 160 lb 159 lb 13.3 oz 160 lb  Weight (kg) 72.576 kg 72.5 kg 72.576 kg  Some encounter information is confidential and restricted. Go to Review Flowsheets activity to see all data.     Body mass index is 26.63 kg/m.  General:  Well nourished, well developed, in no acute distress HEENT: normal Lymph: no adenopathy Neck: no JVD Endocrine:  No thryomegaly Vascular: No carotid bruits; FA pulses 2+ bilaterally without bruits  Cardiac:  normal S1, S2; RRR; no murmur  Lungs:  clear to auscultation bilaterally, no wheezing, rhonchi or rales  Abd: soft, nontender, no hepatomegaly  Ext: no edema Musculoskeletal:  No deformities, BUE and BLE strength normal and equal Skin: warm and dry  Neuro:  CNs 2-12 intact, no focal  abnormalities noted Psych:  Normal affect .  The patient is somewhat incoherent and mumbling.  EKG:  The EKG was personally reviewed and demonstrates: Sinus rhythm at 76 with septal Q waves and nonspecific ST and T wave changes. Telemetry:  Telemetry was personally reviewed and demonstrates: Sinus rhythm with sinus arrhythmia and occasional PACs  Relevant CV Studies:  Heart cath 2013 ([facility]) DES x 2 to RCA   Laboratory Data:  Chemistry Recent Labs  Lab 04/28/19 0723  NA 137  K 3.5  CL 105  CO2 19*  GLUCOSE 180*  BUN 43*  CREATININE 2.41*  CALCIUM 9.1  GFRNONAA 27*  GFRAA 31*  ANIONGAP 13    Recent Labs  Lab 04/28/19 0723  PROT 6.3*  ALBUMIN 3.6  AST 13*  ALT 12  ALKPHOS 108  BILITOT 0.5   Hematology Recent Labs  Lab 04/28/19 0723  WBC 11.3*  RBC 3.19*  HGB 10.6*  HCT 31.1*  MCV 97.5  MCH 33.2  MCHC 34.1  RDW 12.1  PLT 298   Cardiac Enzymes Recent Labs  Lab 04/28/19 0723 04/28/19 1023  TROPONINI <0.03 <0.03   No results for input(s): TROPIPOC in the last 168 hours.  BNPNo results for input(s): BNP, PROBNP in the last 168 hours.  DDimer No results for input(s): DDIMER in the last 168 hours.  Radiology/Studies:  Dg Chest 2 View  Result Date: 04/28/2019 CLINICAL DATA:  Central chest pain and pressure for 2 weeks. EXAM: CHEST - 2 VIEW COMPARISON:  04/20/2019 FINDINGS: The heart size and mediastinal contours are within normal limits. Both lungs are  clear. The visualized skeletal structures are unremarkable. Tortuous aorta. IMPRESSION: No active cardiopulmonary disease.  No change from prior. Electronically Signed   By: Elsie Stain M.D.   On: 04/28/2019 08:07    Assessment and Plan:   1. Chest pain, known CAD 2. Cocaine use last night - pt presents with complaints of chest pain for the past 2 weeks concerning for angina, relieved with ASA and nitro - troponin x 2 negative - EKG with no signs of acute ischemia - difficult situation with ongoing  polysubstance abuse and AKI - may benefit from nuclear stress test to determine need for definitive angiography   3. AKI - sCr 2.41 - previously normal - may need nephrology consult - avoid ACEI/ARB for now   4. Alcohol abuse 5. Current every day smoker - encourage cessation   6. Hx of stroke with residual left-sided deficits - has been maintained on plavix          For questions or updates, please contact [facility] Please consult [email or web address] for contact info under     Signed, Marcelino Duster, PA  04/28/2019 11:27 AM   Agree with note by Micah Flesher PA-C 63 year old African-American male being seen in the ER for question chest pain.  He has a history of multi-substance abuse including cocaine last night.  His UDS is positive for cocaine and benzodiazepines as well was heavy alcohol abuse and smoking.  He had a remote history of stenting in 2013.  His EKG shows no acute changes.  His enzymes are negative.  He does have renal insufficiency with a creatinine of 2.4 which is new for him.  He is fairly incoherent on exam today lying on his hospital bed in a fetal position.  I am unable to get a accurate accurate history from him.  I suspect he is dehydrated.  At this point, I do not think he needs an aggressive cardiology work-up.  I suggested that he be admitted by the hospitalist, hydrated and detoxed.  His enzymes will be cycled.  I suggested 2D echocardiogram.  Once he is coherent and we can get a better history we could potentially think about doing a functional study such as a Lexiscan Myoview stress test.   Runell Gess, M.D., FACP, [facility], [facility] [address] [place], [place]  [address]  [phone] 04/28/2019 12:27 PM

## 2019-04-28 NOTE — Progress Notes (Signed)
Refused insulin coverage for cbg-299. Explained to him about the significance of insulin to control his blood glucose, but insistent not  to take it. MD aware.

## 2019-04-28 NOTE — Plan of Care (Signed)

## 2019-04-28 NOTE — H&P (Signed)
Date: 04/28/2019               Patient Name:  William CablesLarry J Gonzalez MRN: [id number]  DOB: April 10, 1956 Age / Sex: 63 y.o., male   PCP: Cain SaupeFulp, Cammie, MD         Medical Service: Internal Medicine Teaching Service         Attending Physician: Dr. Sandre Kittyaines, Elwin MochaAlexander N, MD    First Contact: Dr. Maryla MorrowMasoudi Pager: [phone]  Second Contact: Dr. Frances FurbishWinfrey Pager: [phone]       After Hours (After 5p/  First Contact Pager: [phone]  weekends / holidays): Second Contact Pager: [phone]   Chief Complaint: Chest pain  History of Present Illness: 63 year old male with past medical history of CAD status post stent, DM, HTN, history of CVA (left side residual weakness), alcohol and cocaine use, smoking, MDD came to the emergency department today due to chest pain. He uses Cocaine and has Hx of chest pain but it got worse in past 2-3 weeks. He reports having worsening of chest pain for the past 2 to 3 weeks. It is pressure like, crushing pain with intensity of 7/10 that now eased up to 2/7. His pain is mostly at center of his chest.  It is not radiated to her arms, shoulders or neck. He did not try any medication for it and mentions that does not know what makes the pain better or worse. How ever he reports improvement when EMS gave him NG. He has some left side deficit after his stoke so does not walk a lot and is not sure is pain worsens with activity. He denies N/V, SOB, palpitation. He usesd Cocaine last time yesterday. He came to the ED for further evaluation.   Meds:  No outpatient medications have been marked as taking for the 04/28/19 encounter [facility](Hospital Encounter).     Allergies: Allergies as of 04/28/2019 - Review Complete 04/28/2019  Allergen Reaction Noted  . Iodine Anaphylaxis and Swelling 07/03/2012   Past Medical History:  Diagnosis Date  . Alcohol abuse   . Coronary artery disease   . Diabetes mellitus   . Hypertension   . Stented coronary artery   . Stroke (HCC)   . Tobacco abuse      Family History:  HTN, DM    Mother HTN and DM Father  Social History: He lives alone. Confirms smoking cigarettes, drinking alcohol and cocaine buse.  His last cocaine use and alcohol drinking was last night.  Review of Systems: A complete ROS was negative except as per HPI.   Physical Exam: Blood pressure 128/76, pulse 66, temperature 98.6 F (37 C), temperature source Oral, resp. rate (!) 23, height 5\' 5"  (1.651 m), weight 72.6 kg, SpO2 100 %. Physical Exam Vitals signs and nursing note reviewed.  Constitutional:      Appearance: He is well-developed. He is obese.  HENT:     Head: Normocephalic and atraumatic.  Cardiovascular:     Rate and Rhythm: Tachycardia present. Rhythm irregular.  Pulmonary:     Effort: Pulmonary effort is normal. No respiratory distress.     Breath sounds: No wheezing.  Chest:     Chest wall: No tenderness.  Abdominal:     General: Bowel sounds are normal.     Palpations: Abdomen is soft.  Musculoskeletal:     Right lower leg: No edema.     Left lower leg: No edema.  Neurological:     Mental Status: He is alert.  Comments: Left side weakness. (motor 4/5 at left upper and lower extremities)    BMP Latest Ref Rng & Units 04/28/2019 04/20/2019 04/12/2019  Glucose 70 - 99 mg/dL 573(U) 202(R) 427(C)  BUN 8 - 23 mg/dL 62(B) 15 19  Creatinine 0.61 - 1.24 mg/dL 7.62(G) 3.15 1.76  Sodium 135 - 145 mmol/L 137 137 139  Potassium 3.5 - 5.1 mmol/L 3.5 4.8 4.1  Chloride 98 - 111 mmol/L 105 103 110  CO2 22 - 32 mmol/L 19(L) 24 21(L)  Calcium 8.9 - 10.3 mg/dL 9.1 16.0 9.5    Hb 73.7, MCV 97.5 Cr 2.41, BUN 43 EKG: personally reviewed my interpretation sinus rhythm with left ant. fasicular block, left ventricular hyperthrophy           Second EKG is Afib with RVR  CXR: personally reviewed my interpretation is no acute cardiopulmonary event  Assessment & Plan by Problem: Active Problems:   Chest pain 63 year old male with past medical history of CAD  status post stent, DM HTN, history of CVA (left side residual weakness), alcohol and cocaine use, smoking, MDD came to the emergency department today due to worsening of intermittent chest pain.  ACS rule out: He has Hx ofCAD s.p stent, he uses cocaine. Trop negative and no acute ST-T changes on EKG. Q wave on ant leads. He has Hx of CAD with PCI,  Moderated risk per heart score. Trop was negative and initial EKG at ED was sinus rhythm w/o acute ST-T changes. LVH. Cardiology is on board. Trending Troponin and doing ECho. And may consider nuuclear stress test to determine need for definitive angiography. Patient had irregular rhythm on our exam. Repeated EKG showed AFIB with RVR.  Afib with RVR: Will control the rate with Calcium channel blocker. Avoiding BB now due to Cocaine use.  -Trend Trop -Echo -Diltiazem 30 mg q6h -Continue home ASA 80 mg QD and Plavix 75 mg QD -Continue home Lipitor  -Mg -Phos -CMP -HIV Ab -Cardiac monitoring -Ethanol level -CK -UA--> Nl -UDS--> + for Cocaine and Benzo -Lipase  AKI:  Cr 2.41, BUN 43 Prerenal, likely 2/2 poor poor oral intake. -BMP daily -LR 100 ml/h -Avoiding ACE I and ARB  Normocytic Anemia: Hb at 10.6. previous Hb 2 weeks ago was 12.3. No evidence of bleeding. Wil monitor and checks lab -Ferritin -Iron and TIBC -Vit B12 -CBC daily  DM II: On Metfromin 100 mg BID at home -Sensitive SSI  -CBG monitoring  Alcohol use: Last drink was alst night  -CIWA with Ativan - Folic acid 1 mg IV -Thiamin 100 mg IV QD  Diet: HH carb mod IV fluid: LR VTE ppx: Sub Q Hep Code status: Full  Dispo: Admit patient to Observation with expected length of stay less than 2 midnights.  SignedChevis Pretty, MD 04/28/2019, 6:17 PM  Pager: [phone]

## 2019-04-29 ENCOUNTER — Observation Stay (HOSPITAL_BASED_OUTPATIENT_CLINIC_OR_DEPARTMENT_OTHER): Payer: Medicare Other

## 2019-04-29 DIAGNOSIS — M25511 Pain in right shoulder: Secondary | ICD-10-CM | POA: Diagnosis not present

## 2019-04-29 DIAGNOSIS — R45851 Suicidal ideations: Secondary | ICD-10-CM | POA: Diagnosis present

## 2019-04-29 DIAGNOSIS — Z7902 Long term (current) use of antithrombotics/antiplatelets: Secondary | ICD-10-CM

## 2019-04-29 DIAGNOSIS — Z794 Long term (current) use of insulin: Secondary | ICD-10-CM | POA: Diagnosis not present

## 2019-04-29 DIAGNOSIS — B954 Other streptococcus as the cause of diseases classified elsewhere: Secondary | ICD-10-CM | POA: Diagnosis present

## 2019-04-29 DIAGNOSIS — N179 Acute kidney failure, unspecified: Secondary | ICD-10-CM

## 2019-04-29 DIAGNOSIS — I471 Supraventricular tachycardia: Secondary | ICD-10-CM | POA: Diagnosis not present

## 2019-04-29 DIAGNOSIS — H5500 Unspecified nystagmus: Secondary | ICD-10-CM | POA: Diagnosis not present

## 2019-04-29 DIAGNOSIS — Z20828 Contact with and (suspected) exposure to other viral communicable diseases: Secondary | ICD-10-CM | POA: Diagnosis present

## 2019-04-29 DIAGNOSIS — F17210 Nicotine dependence, cigarettes, uncomplicated: Secondary | ICD-10-CM | POA: Diagnosis present

## 2019-04-29 DIAGNOSIS — I351 Nonrheumatic aortic (valve) insufficiency: Secondary | ICD-10-CM | POA: Diagnosis not present

## 2019-04-29 DIAGNOSIS — I2510 Atherosclerotic heart disease of native coronary artery without angina pectoris: Secondary | ICD-10-CM | POA: Diagnosis present

## 2019-04-29 DIAGNOSIS — K0889 Other specified disorders of teeth and supporting structures: Secondary | ICD-10-CM | POA: Diagnosis not present

## 2019-04-29 DIAGNOSIS — I10 Essential (primary) hypertension: Secondary | ICD-10-CM | POA: Diagnosis present

## 2019-04-29 DIAGNOSIS — Z7984 Long term (current) use of oral hypoglycemic drugs: Secondary | ICD-10-CM

## 2019-04-29 DIAGNOSIS — Z87891 Personal history of nicotine dependence: Secondary | ICD-10-CM | POA: Diagnosis not present

## 2019-04-29 DIAGNOSIS — D649 Anemia, unspecified: Secondary | ICD-10-CM

## 2019-04-29 DIAGNOSIS — X58XXXA Exposure to other specified factors, initial encounter: Secondary | ICD-10-CM | POA: Diagnosis present

## 2019-04-29 DIAGNOSIS — Z79899 Other long term (current) drug therapy: Secondary | ICD-10-CM | POA: Diagnosis not present

## 2019-04-29 DIAGNOSIS — Z955 Presence of coronary angioplasty implant and graft: Secondary | ICD-10-CM | POA: Diagnosis not present

## 2019-04-29 DIAGNOSIS — F10188 Alcohol abuse with other alcohol-induced disorder: Secondary | ICD-10-CM | POA: Diagnosis not present

## 2019-04-29 DIAGNOSIS — F14188 Cocaine abuse with other cocaine-induced disorder: Secondary | ICD-10-CM | POA: Diagnosis present

## 2019-04-29 DIAGNOSIS — R7881 Bacteremia: Secondary | ICD-10-CM | POA: Diagnosis not present

## 2019-04-29 DIAGNOSIS — F1410 Cocaine abuse, uncomplicated: Secondary | ICD-10-CM | POA: Diagnosis not present

## 2019-04-29 DIAGNOSIS — Z91048 Other nonmedicinal substance allergy status: Secondary | ICD-10-CM

## 2019-04-29 DIAGNOSIS — F1010 Alcohol abuse, uncomplicated: Secondary | ICD-10-CM | POA: Diagnosis present

## 2019-04-29 DIAGNOSIS — R4182 Altered mental status, unspecified: Secondary | ICD-10-CM | POA: Diagnosis not present

## 2019-04-29 DIAGNOSIS — E871 Hypo-osmolality and hyponatremia: Secondary | ICD-10-CM | POA: Diagnosis not present

## 2019-04-29 DIAGNOSIS — D72829 Elevated white blood cell count, unspecified: Secondary | ICD-10-CM | POA: Diagnosis not present

## 2019-04-29 DIAGNOSIS — G4700 Insomnia, unspecified: Secondary | ICD-10-CM | POA: Diagnosis present

## 2019-04-29 DIAGNOSIS — I480 Paroxysmal atrial fibrillation: Secondary | ICD-10-CM | POA: Diagnosis not present

## 2019-04-29 DIAGNOSIS — B955 Unspecified streptococcus as the cause of diseases classified elsewhere: Secondary | ICD-10-CM | POA: Diagnosis not present

## 2019-04-29 DIAGNOSIS — Z720 Tobacco use: Secondary | ICD-10-CM | POA: Diagnosis not present

## 2019-04-29 DIAGNOSIS — I4891 Unspecified atrial fibrillation: Secondary | ICD-10-CM | POA: Diagnosis not present

## 2019-04-29 DIAGNOSIS — S46001A Unspecified injury of muscle(s) and tendon(s) of the rotator cuff of right shoulder, initial encounter: Secondary | ICD-10-CM | POA: Diagnosis not present

## 2019-04-29 DIAGNOSIS — Z833 Family history of diabetes mellitus: Secondary | ICD-10-CM | POA: Diagnosis not present

## 2019-04-29 DIAGNOSIS — R079 Chest pain, unspecified: Secondary | ICD-10-CM | POA: Diagnosis not present

## 2019-04-29 DIAGNOSIS — E119 Type 2 diabetes mellitus without complications: Secondary | ICD-10-CM | POA: Diagnosis present

## 2019-04-29 DIAGNOSIS — Z888 Allergy status to other drugs, medicaments and biological substances status: Secondary | ICD-10-CM | POA: Diagnosis not present

## 2019-04-29 DIAGNOSIS — F1994 Other psychoactive substance use, unspecified with psychoactive substance-induced mood disorder: Secondary | ICD-10-CM | POA: Diagnosis not present

## 2019-04-29 DIAGNOSIS — Z7982 Long term (current) use of aspirin: Secondary | ICD-10-CM

## 2019-04-29 DIAGNOSIS — I69354 Hemiplegia and hemiparesis following cerebral infarction affecting left non-dominant side: Secondary | ICD-10-CM | POA: Diagnosis not present

## 2019-04-29 DIAGNOSIS — R0789 Other chest pain: Secondary | ICD-10-CM | POA: Diagnosis present

## 2019-04-29 DIAGNOSIS — I352 Nonrheumatic aortic (valve) stenosis with insufficiency: Secondary | ICD-10-CM | POA: Diagnosis present

## 2019-04-29 LAB — FERRITIN: Ferritin: 82 ng/mL (ref 24–336)

## 2019-04-29 LAB — COMPREHENSIVE METABOLIC PANEL
ALT: 11 U/L (ref 0–44)
AST: 12 U/L — ABNORMAL LOW (ref 15–41)
Albumin: 3.2 g/dL — ABNORMAL LOW (ref 3.5–5.0)
Alkaline Phosphatase: 107 U/L (ref 38–126)
Anion gap: 9 (ref 5–15)
BUN: 24 mg/dL — ABNORMAL HIGH (ref 8–23)
CO2: 19 mmol/L — ABNORMAL LOW (ref 22–32)
Calcium: 8.9 mg/dL (ref 8.9–10.3)
Chloride: 110 mmol/L (ref 98–111)
Creatinine, Ser: 1.3 mg/dL — ABNORMAL HIGH (ref 0.61–1.24)
GFR calc Af Amer: >60 mL/min (ref >60)
GFR calc non Af Amer: 57 mL/min — ABNORMAL LOW (ref >60)
Glucose, Bld: 268 mg/dL — ABNORMAL HIGH (ref 70–99)
Potassium: 3.8 mmol/L (ref 3.5–5.1)
Sodium: 138 mmol/L (ref 135–145)
Total Bilirubin: 0.5 mg/dL (ref 0.3–1.2)
Total Protein: 5.9 g/dL — ABNORMAL LOW (ref 6.5–8.1)

## 2019-04-29 LAB — GLUCOSE, CAPILLARY
Glucose-Capillary: 238 mg/dL — ABNORMAL HIGH (ref 70–99)
Glucose-Capillary: 264 mg/dL — ABNORMAL HIGH (ref 70–99)
Glucose-Capillary: 313 mg/dL — ABNORMAL HIGH (ref 70–99)
Glucose-Capillary: 178 mg/dL — ABNORMAL HIGH (ref 70–99)

## 2019-04-29 LAB — IRON AND TIBC
Iron: 54 ug/dL (ref 45–182)
Saturation Ratios: 20 % (ref 17.9–39.5)
TIBC: 266 ug/dL (ref 250–450)
UIBC: 212 ug/dL

## 2019-04-29 LAB — CBC
HCT: 31.1 % — ABNORMAL LOW (ref 39.0–52.0)
Hemoglobin: 10.7 g/dL — ABNORMAL LOW (ref 13.0–17.0)
MCH: 33.1 pg (ref 26.0–34.0)
MCHC: 34.4 g/dL (ref 30.0–36.0)
MCV: 96.3 fL (ref 80.0–100.0)
Platelets: 349 10*3/uL (ref 150–400)
RBC: 3.23 MIL/uL — ABNORMAL LOW (ref 4.22–5.81)
RDW: 11.9 % (ref 11.5–15.5)
WBC: 11.7 10*3/uL — ABNORMAL HIGH (ref 4.0–10.5)
nRBC: 0 % (ref 0.0–0.2)

## 2019-04-29 LAB — ECHOCARDIOGRAM COMPLETE
Height: 65 in
Weight: 2560 oz

## 2019-04-29 LAB — HIV ANTIBODY (ROUTINE TESTING W REFLEX): HIV Screen 4th Generation wRfx: NONREACTIVE

## 2019-04-29 LAB — VITAMIN B12: Vitamin B-12: 732 pg/mL (ref 180–914)

## 2019-04-29 LAB — TROPONIN I: Troponin I: <0.03 ng/mL (ref <0.03)

## 2019-04-29 MED ORDER — THIAMINE HCL 100 MG/ML IJ SOLN: 100.0000 mg | Freq: Every day | INTRAMUSCULAR | Status: DC

## 2019-04-29 MED ORDER — FOLIC ACID 1 MG PO TABS
1.0000 mg | ORAL_TABLET | Freq: Every day | ORAL | Status: DC
  Administered 2019-04-29 – 2019-05-11 (×13): 1 mg via ORAL
  Filled 2019-04-29 (×12): qty 1

## 2019-04-29 MED ORDER — K PHOS MONO-SOD PHOS DI & MONO 155-852-130 MG PO TABS
500.0000 mg | ORAL_TABLET | Freq: Once | ORAL | Status: AC
  Administered 2019-04-29: 500 mg via ORAL
  Filled 2019-04-29: qty 2

## 2019-04-29 MED ORDER — VITAMIN B-1 100 MG PO TABS
100.0000 mg | ORAL_TABLET | Freq: Every day | ORAL | Status: DC
  Administered 2019-04-29 – 2019-05-11 (×13): 100 mg via ORAL
  Filled 2019-04-29 (×13): qty 1

## 2019-04-29 NOTE — Progress Notes (Signed)
  Echocardiogram 2D Echocardiogram has been performed.  Eon Zunker L Androw 04/29/2019, 9:04 AM

## 2019-04-29 NOTE — Progress Notes (Signed)
Progress Note  Patient Name: William Gonzalez Date of Encounter: 04/29/2019  Primary Cardiologist: Dr. Nanetta Batty  Subjective   Complains of atypical chest pain this morning  Inpatient Medications    Scheduled Meds: . atorvastatin  40 mg Oral q1800  . clopidogrel  75 mg Oral Daily  . diltiazem  30 mg Oral Q6H  . folic acid  1 mg Oral Daily  . heparin  5,000 Units Subcutaneous Q8H  . insulin aspart  0-5 Units Subcutaneous QHS  . insulin aspart  0-9 Units Subcutaneous TID WC  . pantoprazole  40 mg Oral Daily  . ramelteon  8 mg Oral QHS  . thiamine  100 mg Oral Daily   Continuous Infusions:  PRN Meds: acetaminophen **OR** acetaminophen, LORazepam   Vital Signs    Vitals:   04/28/19 2011 04/28/19 2300 04/29/19 0300 04/29/19 0745  BP: 132/76 136/71 (!) 132/57 (!) 158/71  Pulse: 97  69   Resp: (!) 25 (!) 22 20 19   Temp: (!) 97.5 F (36.4 C) 98.1 F (36.7 C) 97.6 F (36.4 C) 97.7 F (36.5 C)  TempSrc: Oral Oral Axillary Oral  SpO2: 97% 98% 99%   Weight:      Height:        Intake/Output Summary (Last 24 hours) at 04/29/2019 0930 Last data filed at 04/29/2019 0744 Gross per 24 hour  Intake 1854.74 ml  Output 1775 ml  Net 79.74 ml   Last 3 Weights 04/28/2019 04/20/2019 04/08/2019  Weight (lbs) 160 lb 159 lb 13.3 oz 160 lb  Weight (kg) 72.576 kg 72.5 kg 72.576 kg  Some encounter information is confidential and restricted. Go to Review Flowsheets activity to see all data.      Telemetry    Atrial fibrillation with rapid ventricular response- Personally Reviewed  ECG    Normal sinus rhythm at 76 with septal Q waves- Personally Reviewed  Physical Exam   GEN: No acute distress.   Neck: No JVD Cardiac: RRR, no murmurs, rubs, or gallops.  Respiratory: Clear to auscultation bilaterally. GI: Soft, nontender, non-distended  MS: No edema; No deformity. Neuro:  Nonfocal  Psych: Normal affect   Labs    Chemistry Recent Labs  Lab 04/28/19 0723 04/29/19  0240  NA 137 138  K 3.5 3.8  CL 105 110  CO2 19* 19*  GLUCOSE 180* 268*  BUN 43* 24*  CREATININE 2.41* 1.30*  CALCIUM 9.1 8.9  PROT 6.3* 5.9*  ALBUMIN 3.6 3.2*  AST 13* 12*  ALT 12 11  ALKPHOS 108 107  BILITOT 0.5 0.5  GFRNONAA 27* 57*  GFRAA 31* >60  ANIONGAP 13 9     Hematology Recent Labs  Lab 04/28/19 0723 04/29/19 0240  WBC 11.3* 11.7*  RBC 3.19* 3.23*  HGB 10.6* 10.7*  HCT 31.1* 31.1*  MCV 97.5 96.3  MCH 33.2 33.1  MCHC 34.1 34.4  RDW 12.1 11.9  PLT 298 349    Cardiac Enzymes Recent Labs  Lab 04/28/19 0723 04/28/19 1023 04/28/19 1955 04/29/19 0240  TROPONINI <0.03 <0.03 <0.03 <0.03   No results for input(s): TROPIPOC in the last 168 hours.   BNPNo results for input(s): BNP, PROBNP in the last 168 hours.   DDimer No results for input(s): DDIMER in the last 168 hours.   Radiology    Dg Chest 2 View  Result Date: 04/28/2019 CLINICAL DATA:  Central chest pain and pressure for 2 weeks. EXAM: CHEST - 2 VIEW COMPARISON:  04/20/2019 FINDINGS: The heart  size and mediastinal contours are within normal limits. Both lungs are clear. The visualized skeletal structures are unremarkable. Tortuous aorta. IMPRESSION: No active cardiopulmonary disease.  No change from prior. Electronically Signed   By: Elsie StainJohn T Curnes M.D.   On: 04/28/2019 08:07    Cardiac Studies   None  Patient Profile     63 y.o. male with history of CAD status post [facility].  His other problems include continued tobacco abuse, polysubstance abuse including cocaine.  Admitted with chest pain.  He did have altered mental status.  His enzymes were negative and his EKG showed no acute changes.  Assessment & Plan    1.  CAD- history of CAD status [facility].  He was admitted with chest pain which is somewhat atypical.  It was difficult to get a history from him.  His enzymes have remained negative and his EKG shows no acute changes.  I favor  functional testing with Lexiscan to risk stratify him  2.  Acute renal insufficiency-serum creatinine initially was 2.4 which has fallen to 1.3.  I suspect this is related to dehydration.  3.  Polysubstance abuse- history of cocaine use and heavy alcohol use.  He was UDS positive for cocaine.  4.  Prior history of stroke- has been maintained on Plavix  5.  A. fib with RVR- he says that he is been in A. fib in the past.  He has been rate controlled with immediate release diltiazem.  Agree to avoid beta-blocker given his cocaine use.  I do not think he is a good oral anticoagulant candidate.     For questions or updates, please contact [facility] Please consult [email or web address] for contact info under        Signed, Nanetta BattyJonathan Tayla Panozzo, MD  04/29/2019, 9:30 AM

## 2019-04-29 NOTE — Progress Notes (Addendum)
Inpatient Diabetes Program Recommendations  AACE/ADA: New Consensus Statement on Inpatient Glycemic Control (2015)  Target Ranges:  Prepandial:   less than 140 mg/dL      Peak postprandial:   less than 180 mg/dL (1-2 hours)      Critically ill patients:  140 - 180 mg/dL   Lab Results  Component Value Date   GLUCAP 238 (H) 04/29/2019   HGBA1C 7.8 (H) 04/12/2019    Review of Glycemic Control Results for William Gonzalez, William Gonzalez (MRN [id number]) as of 04/29/2019 09:24  Ref. Range 04/28/2019 07:26 04/28/2019 16:17 04/28/2019 21:28 04/29/2019 06:29  Glucose-Capillary Latest Ref Range: 70 - 99 mg/dL 600 (H) 459 (H) 977 (H) 238 (H)   Diabetes history: DM2 Outpatient Diabetes medications: Glucotrol 10 mg bid + Metformin 1 gm bid Current orders for Inpatient glycemic control: Novolog sensitive correction tid + hs 0-5  Inpatient Diabetes Program Recommendations:   Noted patient has been refusing Novolog correction. Will plan to speak to patient to discuss need to control CBGs. -Levemir 10 units (0.15 units/kg x 72.6 kg =10.9 units)  12:20 Spoke with patient by phone and reviewed need to keep CBGs within range of 140-180 while his oral medications are held by giving insulin as needed. Patient states he spoke with his physician and agreed to take "the smallest amount needed". Patient fears going low while in the hospital.Reassured patient insulin adjusted as needs change.  Thank you, William Gonzalez. Bracha Frankowski, RN, MSN, CDE  Diabetes Coordinator Inpatient Glycemic Control Team Team Pager [phone] (8am-5pm) 04/29/2019 9:28 AM

## 2019-04-29 NOTE — Progress Notes (Signed)
  Date: 04/29/2019  Patient name: TREDARIUS TENEYCK  Medical record number: [id number]  Date of birth: 04/06/1956   I have seen and evaluated this patient and I have discussed the plan of care with the house staff. Please see their note for complete details. I concur with their findings with the following additions/corrections:   Please see my separate attestation of the H&P from 04/28/2019.  [name], M.D., Ph.D. 04/29/2019, 2:30 PM

## 2019-04-29 NOTE — TOC Initial Note (Addendum)
Transition of Care [facility](TOC) - Initial/Assessment Note    Patient Details  Name: William Gonzalez MRN: [id number] Date of Birth: 06-26-1956  Transition of Care [facility](TOC) CM/SW Contact:    Cherylann Parrlaxton, Attilio Zeitler S, RN Phone Number: 04/29/2019, 1:50 PM  Clinical Narrative:    PTA pt lived alone in a hotel.  Pt informed CM that his mobility has declined over the past month - pt interested in SNF placement if appropriate.  CM requested PT eval from attending.  [facility] - [facility] informed that [facility] crisis [facility]5 verbalizing suicide ideation - CM alerted both attending and bedside nurse.    [facility]    Expected Discharge Plan: [facility]     Patient Goals and CMS Choice Patient states their goals for this hospitalization and ongoing recovery are:: (I want to be able to walk again like before - pt informed CM that he has had issues with mobility for the past month)      Expected Discharge Plan and Services Expected Discharge Plan: Psychiatric Hospital   Discharge Planning Services: CM Consult   Living arrangements for the past 2 months: Hotel/Motel                                      Prior Living Arrangements/Services Living arrangements for the past 2 months: Hotel/Motel Lives with:: Self   Do you feel safe going back to the place where you live?: No(Pt concerned about recent changes in mobility)   see above    Care giver support system in place?: No (comment) Current home services: Homehealth aide([facility])    Activities of Daily Living Home Assistive Devices/Equipment: Dan HumphreysWalker (specify type) ADL Screening (condition at time of admission) Patient's cognitive ability adequate to safely complete daily activities?: Yes Is the patient deaf or have difficulty hearing?: No Does the patient have difficulty seeing, even when wearing glasses/contacts?: No Does the patient have difficulty concentrating,  remembering, or making decisions?: No Patient able to express need for assistance with ADLs?: Yes Does the patient have difficulty dressing or bathing?: Yes Independently performs ADLs?: Yes (appropriate for developmental age) Does the patient have difficulty walking or climbing stairs?: No Weakness of Legs: None Weakness of Arms/Hands: None  Permission Sought/Granted Permission sought to share information with : Case Manager Permission granted to share information with : Yes, Verbal Permission Granted              Emotional Assessment     Affect (typically observed): Frustrated, Accepting Orientation: : Oriented to Self, Oriented to Place, Oriented to  Time, Oriented to Situation Alcohol / Substance Use: Alcohol Use, Illicit Drugs Psych Involvement: Yes (comment)  Admission diagnosis:  cp Patient Active Problem List   Diagnosis Date Noted  . Chest pain 04/28/2019  . MDD (major depressive disorder), recurrent episode (HCC) 04/21/2019  . Suicidal ideations   . Alcohol dependence (HCC) 04/13/2019  . MDD (major depressive disorder), recurrent severe, without psychosis (HCC) 04/08/2019  . Anemia 04/18/2018  . Ataxic gait   . Cocaine abuse (HCC)   . Cerebellar infarct (HCC) 04/21/2015  . Tobacco abuse 04/21/2015  . Alcohol abuse 04/21/2015  . Stroke (HCC) 04/21/2015  . Diabetes mellitus without complication (HCC) 04/21/2015  . Coronary artery disease   . Hypertension    PCP:  Cain SaupeFulp, Cammie, MD  Pharmacy:   [facility] [place], [place] - [address] [phone] [facility] [place] [address] [address] Phone: [phone] Fax: [phone]  [facility] [address] [place]), [place] - [address] [place] [address] Phone: [phone] Fax: [phone]     Social Determinants of Health (SDOH) Interventions    Readmission Risk Interventions Readmission Risk Prevention Plan 04/29/2019  04/29/2019  Transportation Screening - Complete  Medication Review (RN Care Manager) Complete Complete  PCP or Specialist appointment within 3-5 days of discharge (No Data) -  HRI or Home Care Consult Complete -  SW Recovery Care/Counseling Consult Complete -  Palliative Care Screening Not Applicable -  Skilled Nursing Facility Not Complete -  Some recent data might be hidden

## 2019-04-29 NOTE — Progress Notes (Signed)
Subjective: Patient was seen and evaluated at bedside on morning rounds.  His chest pain has significantly improved.  He reports some episodes of palpitation. (Endorses that he had A. fib before the [facility] and was on a medication for it for a while but it stopped by his doctor) He denies any shortness of breath, nausea vomiting.  We discussed above plan for doing nuclear stress test tomorrow.  He mentions that he has denies getting insulin last night because he is told by his doctor did he does not need it. All his questions and concerns regarding that were answered.  Objective:  Vital signs in last 24 hours: Vitals:   04/28/19 1500 04/28/19 2011 04/28/19 2300 04/29/19 0300  BP:  132/76 136/71 (!) 132/57  Pulse:  97  69  Resp:  (!) 25 (!) 22 20  Temp: 98.6 F (37 C) (!) 97.5 F (36.4 C) 98.1 F (36.7 C) 97.6 F (36.4 C)  TempSrc: Oral Oral Oral Axillary  SpO2:  97% 98% 99%  Weight:      Height:       Physical  exam: General: No acute distress CV: Irregular rhythm, no tachycardia currently Pulmonary exam: No respiratory distress, CTA bilaterally Abdomen: Is soft and nontender to palpation BS are present Neurology exam: Is alert and oriented x3 Extremities: No lower extremity edema, pulses are normal  Assessment/Plan:  Active Problems:   Chest pain  63 year old male with past medical history of CAD status post stent, DM HTN, history of CVA (left side residual weakness), alcohol and cocaine use, smoking, MDD came to the emergency department today due to worsening of intermittent chest pain.  ACS rule out: Cocaine use: No evidence of acute ischemia.  No acute ST-T changes on EKG, troponin trend remained less than 0.03. He has multiple risk factor including Hx of CAD s.p stent, cocaine use and smoking, and has current A. fib rhythm on no medication. Cardiology is on board in is going to do nuclear stress test to determine need for definitive angiography  considering multiple risk factors. Echo pending today.   -Continue home ASA 80 mg QD and Plavix 75 mg QD -Continue home Lipitor  -CMP daily -f/u Echo -Cardiology is on board. Appreciate recommendation -Nuclear stress test tomorrow per cardiology  A- fib: He had sinus rhythm on arrival that then changed to Afib with RVR. On no medications at home. No record at chart regariding Hx od Afib but he mentions that he has previous history of Afib but his meds [facility] while ago.  Started on Diltiazem 30 mg q6h yesterday. His rate was mainly controled around 70-80s. Had episode of Afib with HR 120s. Will continue current dose of Dilt and monitor him. May increase Dilitizem dose as needed.  Avoiding BB now due to Cocaine use. Phosis low at 2.3 .Repleting   -Continue diltiazem 30 mg q6h -Cardiac monitoring -K-Phos 500 once  AKI:  Admission labs consistent with AKI with Cr 2.41, BUN 43 Likely Prerenal, 2/2 poor poor oral intake. Received LR 17000ml/h. Cr Improved to 1.4 today  -BMP daily   Normocytic Anemia: Hb at 10.6. previous Hb 2 weeks ago was 12.3. No evidence of bleeding. Wil monitor and checks lab Ferritin and iron studies and Vit B12 will are normal.  -CBC daily  DM II: On Metfromin 100 mg BID at home. He refused Insulin yesterday, but agrees to receive it today per scale  -Sensitive SSI  -CBG monitoring  Alcohol use:   -CIWA with Ativan - Folic acid 1 mg IV -Thiamin 100 mg IV QD  Diet: HH carb mod IV fluid: None VTE ppx: Sub Q Hep Code status: Full  Dispo: Anticipated discharge in approximately 1-2 days  Chevis Pretty, MD 04/29/2019, 7:12 AM Pager: [phone]

## 2019-04-30 ENCOUNTER — Inpatient Hospital Stay (HOSPITAL_COMMUNITY): Payer: Medicare Other

## 2019-04-30 DIAGNOSIS — R079 Chest pain, unspecified: Secondary | ICD-10-CM

## 2019-04-30 DIAGNOSIS — I480 Paroxysmal atrial fibrillation: Secondary | ICD-10-CM

## 2019-04-30 DIAGNOSIS — I10 Essential (primary) hypertension: Secondary | ICD-10-CM

## 2019-04-30 DIAGNOSIS — I351 Nonrheumatic aortic (valve) insufficiency: Secondary | ICD-10-CM

## 2019-04-30 LAB — COMPREHENSIVE METABOLIC PANEL
ALT: 12 U/L (ref 0–44)
AST: 13 U/L — ABNORMAL LOW (ref 15–41)
Albumin: 3.4 g/dL — ABNORMAL LOW (ref 3.5–5.0)
Alkaline Phosphatase: 119 U/L (ref 38–126)
Anion gap: 12 (ref 5–15)
BUN: 12 mg/dL (ref 8–23)
CO2: 24 mmol/L (ref 22–32)
Calcium: 9.2 mg/dL (ref 8.9–10.3)
Chloride: 104 mmol/L (ref 98–111)
Creatinine, Ser: 0.9 mg/dL (ref 0.61–1.24)
GFR calc Af Amer: >60 mL/min (ref >60)
GFR calc non Af Amer: >60 mL/min (ref >60)
Glucose, Bld: 141 mg/dL — ABNORMAL HIGH (ref 70–99)
Potassium: 3.6 mmol/L (ref 3.5–5.1)
Sodium: 140 mmol/L (ref 135–145)
Total Bilirubin: 0.6 mg/dL (ref 0.3–1.2)
Total Protein: 6.3 g/dL — ABNORMAL LOW (ref 6.5–8.1)

## 2019-04-30 LAB — GLUCOSE, CAPILLARY
Glucose-Capillary: 126 mg/dL — ABNORMAL HIGH (ref 70–99)
Glucose-Capillary: 369 mg/dL — ABNORMAL HIGH (ref 70–99)
Glucose-Capillary: 258 mg/dL — ABNORMAL HIGH (ref 70–99)
Glucose-Capillary: 244 mg/dL — ABNORMAL HIGH (ref 70–99)

## 2019-04-30 LAB — NM MYOCAR MULTI W/SPECT W/WALL MOTION / EF
Estimated workload: 1 METS
Exercise duration (min): 5 min
Exercise duration (sec): 46 s
MPHR: 155 {beats}/min
Peak HR: 133 {beats}/min
Percent HR: 65 %
Rest HR: 100 {beats}/min

## 2019-04-30 LAB — CBC
HCT: 33.8 % — ABNORMAL LOW (ref 39.0–52.0)
Hemoglobin: 11.8 g/dL — ABNORMAL LOW (ref 13.0–17.0)
MCH: 33.2 pg (ref 26.0–34.0)
MCHC: 34.9 g/dL (ref 30.0–36.0)
MCV: 95.2 fL (ref 80.0–100.0)
Platelets: 352 10*3/uL (ref 150–400)
RBC: 3.55 MIL/uL — ABNORMAL LOW (ref 4.22–5.81)
RDW: 11.9 % (ref 11.5–15.5)
WBC: 15.6 10*3/uL — ABNORMAL HIGH (ref 4.0–10.5)
nRBC: 0 % (ref 0.0–0.2)

## 2019-04-30 MED ORDER — DILTIAZEM HCL 25 MG/5ML IV SOLN
20.0000 mg | Freq: Once | INTRAVENOUS | Status: AC
  Administered 2019-04-30: 20 mg via INTRAVENOUS
  Filled 2019-04-30: qty 5

## 2019-04-30 MED ORDER — TECHNETIUM TC 99M TETROFOSMIN IV KIT
10.0000 | PACK | Freq: Once | INTRAVENOUS | Status: AC | PRN
  Administered 2019-04-30: 10 via INTRAVENOUS

## 2019-04-30 MED ORDER — REGADENOSON 0.4 MG/5ML IV SOLN
INTRAVENOUS | Status: AC
  Administered 2019-04-30: 13:00:00
  Filled 2019-04-30: qty 5

## 2019-04-30 MED ORDER — DILTIAZEM HCL 60 MG PO TABS
60.0000 mg | ORAL_TABLET | Freq: Four times a day (QID) | ORAL | Status: DC
  Administered 2019-04-30 – 2019-05-01 (×3): 60 mg via ORAL
  Filled 2019-04-30 (×3): qty 1

## 2019-04-30 MED ORDER — REGADENOSON 0.4 MG/5ML IV SOLN
0.4000 mg | Freq: Once | INTRAVENOUS | Status: AC
  Administered 2019-04-30: 0.4 mg via INTRAVENOUS
  Filled 2019-04-30: qty 5

## 2019-04-30 MED ORDER — TECHNETIUM TC 99M TETROFOSMIN IV KIT
30.0000 | PACK | Freq: Once | INTRAVENOUS | Status: AC | PRN
  Administered 2019-04-30: 30 via INTRAVENOUS

## 2019-04-30 NOTE — Progress Notes (Addendum)
Subjective: Patient was seen and evaluated at bedside on morning rounds.His heart rate was elevate  Overnight and received IV Diltiazem per cardiooogy.  Stress test performed today. No chest pain. Reports some soreness on his neck and asks for Tylenol. No other acute complaints. Asking about his suicidal thought, he says that he is good now but he needs help.  Objective:  Vital signs in last 24 hours: Vitals:   04/29/19 1143 04/29/19 1526 04/29/19 2300 04/30/19 0300  BP: (!) 158/61 (!) 150/81 (!) 177/96 (!) 141/82  Pulse: 74 (!) 112 (!) 120 (!) 108  Resp: 19  20 (!) 23  Temp: 97.8 F (36.6 C) 98.3 F (36.8 C) 98 F (36.7 C) 97.9 F (36.6 C)  TempSrc: Oral Oral Oral Oral  SpO2: 97%  98% 99%  Weight:      Height:        Physical  exam: General: No acute distress CV: Irregular rhythm, mildly tachycardia currently Pulmonary exam: No respiratory distress, CTA bilaterally Abdomen: Is soft and nontender to palpation BS are present Neurology exam: Is alert and oriented x3 Extremities: No lower extremity edema, pulses are normal   Assessment/Plan:  Principal Problem:   Chest pain Active Problems:   Coronary artery disease   Hypertension   Tobacco abuse   Alcohol abuse   Diabetes mellitus without complication (HCC)   Cocaine abuse (HCC)   Atrial fibrillation [facility](HCC)  Assessment/Plan:  63 year old male with past medical history of CAD status post stent, DM HTN, history of CVA(left side residual weakness),alcohol and cocaine use, smoking, MDD came to the emergency department today due toworsening of intermittentchest pain.  ACS rule out/ Chest pain in setting of cocaine use : UDS positive for cocasine Nuclear stress test today showed: Fixed inferior wall defect. Mild global hypokinesis. No significant reversible ischemia with pharmacologic stress. LVEF 40%.   -Continue home ASA 80 mg QD and Plavix 75 mg QD -Continue home Lipitor  -CMP daily -Cardiology is on board.  Appreciate recommendation  A- fib with RVR Reports Hx of Afib before and has been on Coreg at home. (medications Hx was not clear initially but family brough his meds yesterday and reviewed by pharmacy.)   Started on Diltiazem 3 q 6h at hospital. Needed a dose of IV Diltiazem overnight. Prefer to avoid BB due to Cocaine use. How ever seems like he has been on Coreg before admission. We talked to cardiology and probably switch back to Coreg after DC.  -Continue diltiazem 30 mg q6h for now -Per cardiology, do not think that he is a good candidate for oral anticoagulation -Appreciate cardiology recommendation after stress test -Cardiac monitoring  AKI:  Resolved. Cr 0.9 today  Normocytic Anemia: Hb at 10.6. previous Hb 2 weeks ago was 12.3. No evidence of bleeding. Ferritin is 80 and iron studies and Vit B12 will are normal. Hb stable at 11.8 today. With Ferritin 80. IDA is still possible.   -CBC daily -Monitor CBC outpatient -Restart Ferrous sulfate after DC  DM II:On Metfromin 100 mg BID at home. BG 141 this AM  -Sensitive SSI  -CBG monitoring  Alcohol use:   -CIWA with Ativan -Thiamin 100 mg IV QD   MDD, Hx of suicidal thoughts: With recent admission due to suicidal thought. Has been prescribed medications but he has stopped them and mentions that it did not help him. Denies any sucidal thoughts currently but mentions that it may come and go. -Corporate investment bankerContinue sitter -Recommending close f/u with psychiatry outpatient  Diet:HH carb mod IV fluid:None VTE ppx:Sub Q Hep Code status:Full   Dispo: Discharge pending depend on heart rate control and cardiology plan  Chevis Pretty, MD 04/30/2019, 5:14 AM Pager: [phone]

## 2019-04-30 NOTE — Progress Notes (Signed)
  Date: 04/30/2019  Patient name: William Gonzalez  Medical record number: [id number]  Date of birth: 04-May-1956   I have seen and evaluated this patient and I have discussed the plan of care with the house staff. Please see their note for complete details. I concur with their findings with the following additions/corrections:   Went for nuclear stress test today, which identified a fixed lesion in the inferior wall and mild hypokinesis, but no reversible ischemia.  EF was decreased, but this could be related to his current atrial fibrillation, which may be reducing his stroke volume.  LVEF on TTE this admission was normal.  Appreciate cardiology consult, defer decision on further ischemic work-up to them, but suspect we will continue medical management.  On telemetry, he continues in A. fib and has some increased ventricular response this morning.  He received IV diltiazem before stress test.  Discussed with Dr. Allyson Sabal, can resume carvedilol after discharge for BP and heart rate control.  Although beta-blockers are not ideal in the context of cocaine use, carvedilol is thought to be safer given its alpha and beta blockade.  [name] would of course benefit from stopping his cocaine use.  If he ends up needing to stay longer, we could switch to carvedilol here to determine the need for dose adjustment for rate control.  TTE also showed moderate aortic regurgitation.  No need for intervention right now, but will need close follow-up.  As noted by Dr. Maryla Morrow, he has a history of depression and suicidal ideation and had a recent admission in April to behavioral health for suicidal ideation.  He is not very cooperative and conversations regarding his current emotional state.  He denies current SI or HI but general frustration with multiple aspects of his care including his psychiatric prescriptions.  We will continue his home psychiatric medications as detailed in his behavioral health discharge from  April and encourage close follow-up with his psychiatrist.  He does not appear to be an acute danger to himself or others right now and does not require further psychiatric evaluation at this time  [name], M.D., Ph.D. 04/30/2019, 2:26 PM

## 2019-04-30 NOTE — Evaluation (Signed)
Physical Therapy Evaluation Patient Details Name: William Gonzalez MRN: [id number] DOB: 11-Aug-1956 Today's Date: 04/30/2019   History of Present Illness  63 y.o. male with history of CAD status post stenting of his RCA 03/24/2012 at [facility].  His other problems include continued tobacco abuse, polysubstance abuse including cocaine.  Admitted with chest pain.  He did have altered mental status.  His enzymes were negative and his EKG showed no acute changes.  Clinical Impression  Pt admitted with above diagnosis. Pt currently with functional limitations due to the deficits listed below (see PT Problem List). Pt was able to take a few steps to chair however the HR went up to 155 bpm and RR 50.  Notified nursing of incr HR.  Will progress pt as able.   Pt will benefit from skilled PT to increase their independence and safety with mobility to allow discharge to the venue listed below.      Follow Up Recommendations SNF;Supervision/Assistance - 24 hour    Equipment Recommendations  None recommended by PT    Recommendations for Other Services       Precautions / Restrictions Precautions Precautions: Fall Restrictions Weight Bearing Restrictions: No      Mobility  Bed Mobility Overal bed mobility: Needs Assistance Bed Mobility: Supine to Sit     Supine to sit: Min guard     General bed mobility comments: No assist with pt slightly impulsive but able to sit up without help  Transfers Overall transfer level: Needs assistance Equipment used: 4-wheeled walker Transfers: Sit to/from [employer] Sit to Stand: Min assist Stand pivot transfers: Min assist       General transfer comment: Able to stand to rollator with cues and min assist and take pivotal steps to recliner.  Could not progress ambulation as pt HR was up to 155 bpm and RR 50 with activity.    Ambulation/Gait                Stairs            Wheelchair Mobility    Modified Rankin (Stroke  Patients Only)       Balance Overall balance assessment: Needs assistance Sitting-balance support: No upper extremity supported;Feet supported Sitting balance-Leahy Scale: Fair     Standing balance support: Bilateral upper extremity supported;During functional activity Standing balance-Leahy Scale: Poor Standing balance comment: relies on UE support                             Pertinent Vitals/Pain Pain Assessment: No/denies pain    Home Living Family/patient expects to be discharged to:: Unsure Living Arrangements: Alone Available Help at Discharge: Friend(s);Available 24 hours/day Type of Home: Other(Comment)(motel PTA) Home Access: Level entry     Home Layout: One level Home Equipment: Walker - 4 wheels      Prior Function Level of Independence: Needs assistance   Gait / Transfers Assistance Needed: used rollator but reports he stayed in bed alot since his last rehab stay last year  ADL's / Homemaking Assistance Needed: did the best he could per pt  Comments: does not drive, reports several falls     Hand Dominance   Dominant Hand: Right    Extremity/Trunk Assessment   Upper Extremity Assessment Upper Extremity Assessment: Defer to OT evaluation    Lower Extremity Assessment Lower Extremity Assessment: Generalized weakness    Cervical / Trunk Assessment Cervical / Trunk Assessment: Kyphotic  Communication  Communication: No difficulties  Cognition Arousal/Alertness: Awake/alert Behavior During Therapy: WFL for tasks assessed/performed Overall Cognitive Status: Within Functional Limits for tasks assessed                                        General Comments      Exercises General Exercises - Lower Extremity Long Arc Quad: AROM;Both;10 reps;Seated Hip Flexion/Marching: AROM;Both;10 reps;Seated   Assessment/Plan    PT Assessment Patient needs continued PT services  PT Problem List Decreased activity  tolerance;Decreased balance;Decreased mobility;Decreased knowledge of use of DME;Decreased safety awareness;Decreased knowledge of precautions       PT Treatment Interventions DME instruction;Gait training;Functional mobility training;Therapeutic exercise;Therapeutic activities;Balance training;Patient/family education    PT Goals (Current goals can be found in the Care Plan section)  Acute Rehab PT Goals Patient Stated Goal: [facility]  PT Goal Formulation: With patient Time For Goal Achievement: 05/14/19 Potential to Achieve Goals: Good    Frequency Min 2X/week   Barriers to discharge Decreased caregiver support      Co-evaluation               AM-PAC PT "6 Clicks" Mobility  Outcome Measure Help needed turning from your back to your side while in a flat bed without using bedrails?: None Help needed moving from lying on your back to sitting on the side of a flat bed without using bedrails?: None Help needed moving to and from a bed to a chair (including a wheelchair)?: A Little Help needed standing up from a chair using your arms (e.g., wheelchair or bedside chair)?: A Little Help needed to walk in hospital room?: A Lot Help needed climbing 3-5 steps with a railing? : A Lot 6 Click Score: 18    End of Session Equipment Utilized During Treatment: Gait belt Activity Tolerance: Patient limited by fatigue(limited by high HR) Patient left: in chair;with call bell/phone within reach;with nursing/sitter in room Nurse Communication: Mobility status PT Visit Diagnosis: Unsteadiness on feet (R26.81);Muscle weakness (generalized) (M62.81)    Time: 1202-1223 PT Time Calculation (min) (ACUTE ONLY): 21 min   Charges:   PT Evaluation $PT Eval Moderate Complexity: 1 Mod          Jaxx Huish,PT Acute Rehabilitation Services Pager:  [phone]  Office:  [phone]    Berline LopesDawn F Aaden Buckman 04/30/2019, 2:04 PM

## 2019-04-30 NOTE — Progress Notes (Signed)
Progress Note  Patient Name: William CablesLarry J Gonzalez Date of Encounter: 04/30/2019  Primary Cardiologist: Dr. Nanetta BattyJonathan Liviana Mills  Subjective   Complains of atypical chest pain this morning  Inpatient Medications    Scheduled Meds: . atorvastatin  40 mg Oral q1800  . clopidogrel  75 mg Oral Daily  . diltiazem  30 mg Oral Q6H  . folic acid  1 mg Oral Daily  . heparin  5,000 Units Subcutaneous Q8H  . insulin aspart  0-5 Units Subcutaneous QHS  . insulin aspart  0-9 Units Subcutaneous TID WC  . pantoprazole  40 mg Oral Daily  . ramelteon  8 mg Oral QHS  . thiamine  100 mg Oral Daily   Continuous Infusions:  PRN Meds: acetaminophen **OR** acetaminophen, LORazepam   Vital Signs    Vitals:   04/29/19 1143 04/29/19 1526 04/29/19 2300 04/30/19 0300  BP: (!) 158/61 (!) 150/81 (!) 177/96 (!) 141/82  Pulse: 74 (!) 112 (!) 120 (!) 108  Resp: 19  20 (!) 23  Temp: 97.8 F (36.6 C) 98.3 F (36.8 C) 98 F (36.7 C) 97.9 F (36.6 C)  TempSrc: Oral Oral Oral Oral  SpO2: 97%  98% 99%  Weight:      Height:        Intake/Output Summary (Last 24 hours) at 04/30/2019 0814 Last data filed at 04/30/2019 0500 Gross per 24 hour  Intake 960 ml  Output 1725 ml  Net -765 ml   Last 3 Weights 04/28/2019 04/20/2019 04/08/2019  Weight (lbs) 160 lb 159 lb 13.3 oz 160 lb  Weight (kg) 72.576 kg 72.5 kg 72.576 kg  Some encounter information is confidential and restricted. Go to Review Flowsheets activity to see all data.      Telemetry    Atrial fibrillation with rapid ventricular response- Personally Reviewed  ECG    Atrial fibrillation with a ventricular response of 111, left ventricular hypertrophy with left axis deviation personally Reviewed  Physical Exam   GEN: No acute distress.   Neck: No JVD Cardiac: RRR, no murmurs, rubs, or gallops.  Respiratory: Clear to auscultation bilaterally. GI: Soft, nontender, non-distended  MS: No edema; No deformity. Neuro:  Nonfocal  Psych: Normal affect   Labs    Chemistry Recent Labs  Lab 04/28/19 0723 04/29/19 0240 04/30/19 0603  NA 137 138 140  K 3.5 3.8 3.6  CL 105 110 104  CO2 19* 19* 24  GLUCOSE 180* 268* 141*  BUN 43* 24* 12  CREATININE 2.41* 1.30* 0.90  CALCIUM 9.1 8.9 9.2  PROT 6.3* 5.9* 6.3*  ALBUMIN 3.6 3.2* 3.4*  AST 13* 12* 13*  ALT 12 11 12   ALKPHOS 108 107 119  BILITOT 0.5 0.5 0.6  GFRNONAA 27* 57* >60  GFRAA 31* >60 >60  ANIONGAP 13 9 12      Hematology Recent Labs  Lab 04/28/19 0723 04/29/19 0240 04/30/19 0603  WBC 11.3* 11.7* 15.6*  RBC 3.19* 3.23* 3.55*  HGB 10.6* 10.7* 11.8*  HCT 31.1* 31.1* 33.8*  MCV 97.5 96.3 95.2  MCH 33.2 33.1 33.2  MCHC 34.1 34.4 34.9  RDW 12.1 11.9 11.9  PLT 298 349 352    Cardiac Enzymes Recent Labs  Lab 04/28/19 0723 04/28/19 1023 04/28/19 1955 04/29/19 0240  TROPONINI <0.03 <0.03 <0.03 <0.03   No results for input(s): TROPIPOC in the last 168 hours.   BNPNo results for input(s): BNP, PROBNP in the last 168 hours.   DDimer No results for input(s): DDIMER in the last  168 hours.   Radiology    No results found.  Cardiac Studies   None  Patient Profile     63 y.o. male with history of CAD status post stenting of his RCA 03/24/2012 at [facility].  His other problems include continued tobacco abuse, polysubstance abuse including cocaine.  Admitted with chest pain.  He did have altered mental status.  His enzymes were negative and his EKG showed no acute changes.  Assessment & Plan    1.  CAD- history of CAD status post RCA stenting at [facility] 03/24/2012.  He was admitted with chest pain which is somewhat atypical.  It was difficult to get a history from him.  His enzymes have remained negative and his EKG shows no acute changes.  I favor functional testing with Lexiscan to risk stratify him which is being done this morning.  2.  Acute renal insufficiency-serum creatinine initially was 2.4 which has fallen to 0.9.  I suspect this is related to dehydration.  3.   Polysubstance abuse- history of cocaine use and heavy alcohol use.  He was UDS positive for cocaine.  4.  Prior history of stroke- has been maintained on Plavix  5.  A. fib with RVR- he says that he is been in A. fib in the past.  He has been rate controlled with immediate release diltiazem.  He has been on carvedilol in the past.  While this is not an appropriate shock in the setting of cocaine abuse can be used for blood pressure and rate control as an outpatient.  I do not think he is a good oral anticoagulant candidate.    We will reassess after the results of the Myoview are available.  For questions or updates, please contact [facility] Please consult [email or web address] for contact info under        Signed, Nanetta Batty, MD  04/30/2019, 8:14 AM

## 2019-04-30 NOTE — Discharge Summary (Addendum)
Name: William CablesLarry J Gonzalez MRN: [id number] DOB: 09-08-1956 63 y.o. PCP: Cain SaupeFulp, Cammie, MD  Date of Admission: 04/28/2019  7:09 AM Date of Discharge: 05/11/2019 Attending Physician: Tyson AliasVincent, Duncan Thomas, *  Discharge Diagnosis: 1. Principal Problem:   Streptococcal bacteremia Active Problems:   Coronary artery disease   Hypertension   Tobacco abuse   Alcohol abuse   Diabetes mellitus without complication (HCC)   Cocaine abuse (HCC)   Suicidal ideation   Chest pain   Atrial fibrillation (HCC)   Substance induced mood disorder (HCC)    Discharge Medications: Allergies as of 05/11/2019      Reactions   Iodine Anaphylaxis, Swelling   Swells all over      Medication List    STOP taking these medications   amLODipine 10 MG tablet Commonly known as:  NORVASC   DULoxetine 60 MG capsule Commonly known as:  CYMBALTA   metoprolol succinate 50 MG 24 hr tablet Commonly known as:  TOPROL-XL     TAKE these medications   albuterol 108 (90 Base) MCG/ACT inhaler Commonly known as:  VENTOLIN HFA Inhale 1 puff into the lungs every 6 (six) hours as needed for wheezing or shortness of breath.   amantadine 100 MG capsule Commonly known as:  SYMMETREL Take 1 capsule (100 mg total) by mouth 2 (two) times daily. For cravings   amoxicillin 500 MG capsule Commonly known as:  AMOXIL Take 1 capsule (500 mg total) by mouth every 8 (eight) hours for 5 days.   ARIPiprazole 2 MG tablet Commonly known as:  Abilify Take 1 tablet (2 mg total) by mouth daily for 30 days.   atorvastatin 40 MG tablet Commonly known as:  LIPITOR Take 1 tablet (40 mg total) by mouth daily at 6 PM.   carvedilol 25 MG tablet Commonly known as:  COREG Take 25 mg by mouth 2 (two) times a day. If sbp less than 110 or heart rate 55   cetirizine 10 MG tablet Commonly known as:  ZYRTEC Take 10 mg by mouth daily.   clopidogrel 75 MG tablet Commonly known as:  PLAVIX Take 1 tablet (75 mg total) by mouth daily.    diclofenac sodium 1 % Gel Commonly known as:  VOLTAREN Apply 2 g topically every 8 (eight) hours as needed (For pain).   diltiazem 60 MG 12 hr capsule Commonly known as:  CARDIZEM SR Take 1 capsule (60 mg total) by mouth 2 (two) times daily.   ferrous sulfate 325 (65 FE) MG tablet Take 325 mg by mouth See admin instructions. Monday Wednesday Friday   latanoprost 0.005 % ophthalmic solution Commonly known as:  XALATAN Place 1 drop into both eyes at bedtime.   lisinopril 20 MG tablet Commonly known as:  ZESTRIL Take 1 tablet (20 mg total) by mouth daily.   metFORMIN 1000 MG tablet Commonly known as:  GLUCOPHAGE Take 1 tablet (1,000 mg total) by mouth 2 (two) times daily with a meal. What changed:  Another medication with the same name was removed. Continue taking this medication, and follow the directions you see here.   miconazole 2 % powder Commonly known as:  MICOTIN Apply 1 application topically 2 (two) times daily as needed for itching (groin).   nicotine 21 mg/24hr patch Commonly known as:  NICODERM CQ - dosed in mg/24 hours Place 1 patch (21 mg total) onto the skin daily. For smoking cessation   omeprazole 20 MG capsule Commonly known as:  PRILOSEC Take 20 mg by mouth  daily.   ramelteon 8 MG tablet Commonly known as:  ROZEREM Take 1 tablet (8 mg total) by mouth at bedtime.   sildenafil 100 MG tablet Commonly known as:  VIAGRA Take 50 mg by mouth daily as needed for erectile dysfunction.   vortioxetine HBr 10 MG Tabs tablet Commonly known as:  TRINTELLIX Take 1 tablet (10 mg total) by mouth daily. For mood       Disposition and follow-up:   Mr.Bonham William Gonzalez was discharged from [facility] in Stable condition.  At the hospital follow up visit please address:   -Ensure he takes Amoxicillin and repeat CBC at follow up visit. Make sure he follows up with dentist since he is poor dental hygiene and possible dental infection could be the  source of his bacteremia.  -  Echo showed moderate AR. Please follow up for repeating echo  -  Please ensure he takes his entire depression medicine and is seen by psychiatrist -Was encouraged to stop using cocaine -We started him on diltiazem for better A. fib rate control.  Please reassess his vitals and adjust as needed.   - Amlodipine stopped due to adding Diltiazem. Please reassess BP.  2.  Labs / imaging needed at time of follow-up: BMP, CBC 3.  Pending labs/ test needing follow-up: None  Follow-up Appointments: Follow-up Information    Fulp, Cammie, MD. Schedule an appointment as soon as possible for a visit in 1 week(s).   Specialty:  Family Medicine Contact information: [address] [place] [address] [phone]           Hospital Course by problem list: Atypical chest pain: 63 y.o. male with CAD s/p DES x4 2013, Alcohol use disorder, cocaine use disorder, CVA, T2DM who came with chest pain. Troponin negative x 3 and EKG with no acute ST-T changes. Urine tox positive for benzos and cocaine. His chest pain was likely induced by recent Cocaine used. Considering his risk factor he underwent further work by cardiology and performed nuclear stress test. It showed fixed inferior wall defect, mild global hypokinesis, left ventricular ejection fraction 40%. No significant reversible ischemia found and no further intervention performed. He was continued with medical management and his chest pain resolved during the hospitalization.  AKI: likely prerenal and due to not eating and drinking well recently, improved with IV fluid.  Paroxismal Afib: Was not in afib on arrival but then developed afib, he has had this in the past as well.  Started on diltiazem due to cocaine use then switched back to carvedilol. (althought BB is not the ideal tx for him given Cocaine use, he has been on this medicine for a while with good rate control and we continued that per cardiology).  Conversion to NSR at 5/9 and then back in afib 5/16. His rate controled initially on Coreg but then increased, so we held his amlodipine and added PO dltiazem 30 mg q 6 h at 5/18, which changed to Diltiazem SR 60 mg BID. He is advised to follow up with PCP in a week and cardiology as needed to adjust Diltiazem as needed.  Strep G. bacteremia: At the evening of 5/10, patient spiked fever, and developed leukocytosis and some brief mental status changes. No known source of infection found but can be due to some poor dentition possible sourcedental issues. (urine, chest x-ray neg, skin exam negative. He had some shoulder pain that started around the same time, but no evidence of septic arthritis on exam  and MR, also no evidence of meningitis on exam). No evidence of septic emboli on brain MRI. He was initially started on ceftriaxone 5/10 and responded well, Ceftriaxone then changed to penicillin IV 05/06/2019 after blood culture details came back. Switched to PO Amoxicillin at discharge to be continued for 5 more days (total course of 2 weeks). Patient has remained stable, afebrile and asymptomatic and oriented at discharge.  Shoulder Pain: MRI shows multiple chronic tears and evidence of shoulder impingement syndrome.  No signs of septic arthritis. Orhto recommended outpatient management and PT. His pain resolved at DC.   MDD: Patient had recent suicidal thoughts, he remained stable this hospitalization, psychiatry evaluated him and restarted home meds (Amantadine 100 mg BID for EPS prophylaxis, Abilify 2 mg daily for mood stabilization, Ramelteon 8 mg qhs for insomnia and Trintellix 10 mg daily for mood.) for him. No suicidal thought during this admission, discharge to continue medical therapy and follow up out patient.  HTN: His home meds held due to AKI initially and resumed meds at 05/07/2019. Amlodipine discontinued at discharge since he is on Diltiazem.  DMII: Managed with On SSI and meal time insulin 2  u TID.  Will discharge to continue home Metformin.   Discharge Vitals:   BP 121/70 (BP Location: Right Arm)   Pulse (!) 53   Temp 97.7 F (36.5 C) (Oral)   Resp 18   Ht  (1.651 m)   Wt 69.4 kg   SpO2 97%   BMI 25.46 kg/m   Pertinent Labs, Studies, and Procedures:  Nuclear stress test 1. Fixed inferior wall defect. No significant reversible ischemia with pharmacologic stress.  2. Mild global hypokinesis.  3. Left ventricular ejection fraction 40%  4. Non invasive risk stratification*: Intermediate   Echo:04/29/2019 IMPRESSIONS   1. The left ventricle has normal systolic function with an ejection fraction of 60-65%. The cavity size was normal. There is moderately increased left ventricular wall thickness. Left ventricular diastolic Doppler parameters are indeterminate. No  evidence of left ventricular regional wall motion abnormalities.  2. The right ventricle has normal systolic function. The cavity was normal. There is no increase in right ventricular wall thickness.  3. Left atrial size was mildly dilated.  4. Mild calcification of the mitral valve leaflet. No evidence of mitral valve stenosis. Trivial mitral regurgitation.  5. The aortic valve is tricuspid. Moderate calcification of the aortic valve. Aortic valve regurgitation is moderate by color flow Doppler. Mild stenosis of the aortic valve, mean gradient 14 mmHg with AVA 1.8 cm^2.  6. The aortic root is normal in size and structure.  7. Normal IVC size. No complete TR doppler jet so unable to estimate PA systolic pressure Moderate AR   Component 05/03/2019  Specimen Description BLOOD RIGHT HAND   Special Requests BOTTLES DRAWN AEROBIC AND ANAEROBIC Blood Culture adequate volume   Culture Setup Time GRAM POSITIVE COCCI IN CHAINS  IN BOTH AEROBIC AND ANAEROBIC BOTTLES  CRITICAL VALUE NOTED. VALUE IS CONSISTENT WITH PREVIOUSLY REPORTED AND CALLED VALUE.  Performed at [facility], [address] [place], [place] [address]   Culture STREPTOCOCCUS GROUP GAbnormal    Report Status 05/06/2019 FINAL   Organism ID, Bacteria STREPTOCOCCUS GROUP G   Resulting Agency [facility]  Susceptibility    Streptococcus group g    MIC    AMPICILLIN <=0.25 SENS... Sensitive    CEFTRIAXONE <=0.12 SENS... Sensitive    CLINDAMYCIN RESISTANT  Resistant    ERYTHROMYCIN >=8  RESISTANT  Resistant    LEVOFLOXACIN 0.5 SENSITIVE  Sensitive    PENICILLIN  Sensitive1    VANCOMYCIN 0.5 SENSITIVE  Sensitive         1 (Streptococcus group g/PENICILLIN)   SENSITIVE <=0.06      05/05/2019   (important suggestion)  Newer results are available. Click to view them now.  Component 6d ago  Specimen Description BLOOD RIGHT ANTECUBITAL   Special Requests BOTTLES DRAWN AEROBIC ONLY Blood Culture adequate volume   Culture NO GROWTH 5 DAYS  Performed at [facility], [address] [place], [place] [address]   Report Status 05/10/2019 FINAL        (important suggestion)  Newer results are available. Click to view them now.   Ref Range & Units 13d ago  SARS Coronavirus 2 NEGATIVE NEGATIVE     Rt shoulder MRI: 05/04/2019 IMPRESSION: 1. 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 2. 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 3. Partial-thickness articular surface tear of the subscapularis tendon with secondary dislocation of the long head of the biceps tendon into the medial aspect of the joint. Discharge Instructions: Discharge Instructions    Ambulatory referral to Psychiatry   Complete by:  As directed    Patient with poly substance abuse, MDD and Hx od admission for suicidal thoughts, has stopped his mediation. Needs to be seen by a psychiatrist and have close follow up.   Diet - low sodium heart healthy   Complete by:  As directed    Diet - low sodium heart healthy   Complete by:  As directed    Discharge instructions    Complete by:  As directed    Thank you for allowing [place] taking care of you at [facility]. Please take antibiotic Amoxicillin 500 mg every 8 hours as prescribed for you until you finish it. Stop taking Amlodipine and take Diltiazem and follow up with your primary care in 1 week. Your PCP will reassess you and adjust your medications as needed. Stop taking Metoprolol. Take rest of your medications as before and as instructed for you. As we talked please keep your motivation for stop using cocaine. Follow up with psychiatry clinic. See your dentist as your dental issue might have cause the infection we found at your blood.  You can see orthopedics out patient if your shoulder pain started again. Good luck Thank you   Increase activity slowly   Complete by:  As directed    Increase activity slowly   Complete by:  As directed       Signed: Chevis Pretty, MD 05/11/2019, 2:48 PM   Pager: [phone]

## 2019-05-01 DIAGNOSIS — F10188 Alcohol abuse with other alcohol-induced disorder: Secondary | ICD-10-CM

## 2019-05-01 DIAGNOSIS — R45851 Suicidal ideations: Secondary | ICD-10-CM

## 2019-05-01 DIAGNOSIS — F1994 Other psychoactive substance use, unspecified with psychoactive substance-induced mood disorder: Secondary | ICD-10-CM

## 2019-05-01 LAB — COMPREHENSIVE METABOLIC PANEL
ALT: 14 U/L (ref 0–44)
AST: 14 U/L — ABNORMAL LOW (ref 15–41)
Albumin: 3.3 g/dL — ABNORMAL LOW (ref 3.5–5.0)
Alkaline Phosphatase: 115 U/L (ref 38–126)
Anion gap: 11 (ref 5–15)
BUN: 14 mg/dL (ref 8–23)
CO2: 22 mmol/L (ref 22–32)
Calcium: 8.9 mg/dL (ref 8.9–10.3)
Chloride: 105 mmol/L (ref 98–111)
Creatinine, Ser: 1.02 mg/dL (ref 0.61–1.24)
GFR calc Af Amer: >60 mL/min (ref >60)
GFR calc non Af Amer: >60 mL/min (ref >60)
Glucose, Bld: 195 mg/dL — ABNORMAL HIGH (ref 70–99)
Potassium: 4.2 mmol/L (ref 3.5–5.1)
Sodium: 138 mmol/L (ref 135–145)
Total Bilirubin: 0.5 mg/dL (ref 0.3–1.2)
Total Protein: 6.2 g/dL — ABNORMAL LOW (ref 6.5–8.1)

## 2019-05-01 LAB — CBC
HCT: 34.2 % — ABNORMAL LOW (ref 39.0–52.0)
Hemoglobin: 11.8 g/dL — ABNORMAL LOW (ref 13.0–17.0)
MCH: 33.1 pg (ref 26.0–34.0)
MCHC: 34.5 g/dL (ref 30.0–36.0)
MCV: 95.8 fL (ref 80.0–100.0)
Platelets: 357 10*3/uL (ref 150–400)
RBC: 3.57 MIL/uL — ABNORMAL LOW (ref 4.22–5.81)
RDW: 11.9 % (ref 11.5–15.5)
WBC: 15.6 10*3/uL — ABNORMAL HIGH (ref 4.0–10.5)
nRBC: 0 % (ref 0.0–0.2)

## 2019-05-01 LAB — GLUCOSE, CAPILLARY
Glucose-Capillary: 161 mg/dL — ABNORMAL HIGH (ref 70–99)
Glucose-Capillary: 266 mg/dL — ABNORMAL HIGH (ref 70–99)
Glucose-Capillary: 203 mg/dL — ABNORMAL HIGH (ref 70–99)

## 2019-05-01 MED ORDER — CARVEDILOL 25 MG PO TABS
25.0000 mg | ORAL_TABLET | Freq: Two times a day (BID) | ORAL | Status: DC
  Administered 2019-05-01 (×2): 25 mg via ORAL
  Filled 2019-05-01 (×2): qty 1

## 2019-05-01 MED ORDER — VORTIOXETINE HBR 10 MG PO TABS
10.0000 mg | ORAL_TABLET | Freq: Every day | ORAL | Status: DC
  Administered 2019-05-01 – 2019-05-11 (×11): 10 mg via ORAL
  Filled 2019-05-01 (×11): qty 1

## 2019-05-01 MED ORDER — ARIPIPRAZOLE 2 MG PO TABS
2.0000 mg | ORAL_TABLET | Freq: Every day | ORAL | Status: DC
  Administered 2019-05-01 – 2019-05-11 (×11): 2 mg via ORAL
  Filled 2019-05-01 (×11): qty 1

## 2019-05-01 MED ORDER — CARVEDILOL 25 MG PO TABS: 25.0000 mg | ORAL_TABLET | Freq: Two times a day (BID) | ORAL | Status: DC

## 2019-05-01 NOTE — TOC Progression Note (Addendum)
Transition of Care [facility]) - Progression Note    Patient Details  Name: William Gonzalez MRN: [id number] Date of Birth: Jul 07, 1956  Transition of Care [facility] CM/SW Contact  Jasper Riling Phone Number:[phone] 05/01/2019, 5:06 PM  Clinical Narrative:    CSW spoke with the patient at bedside. He was alert and oriented. Patient states he lives alone in a motel and has been for months. Patient states his source of income is his disability. 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 His support system is his Economist . Patient states he does not tell his sister or his son his "personal business" but he has a positive relationship with them. If needed, he prefers to contact his son, [name].  CSW explained PT recommendations for ST rehab at [facility]. Patient states he is agreeable to SNF placement. CSW also explain given his recent suicidal ideation, alcohol abuse and current substance use placement may be difficult. Including, his  plan of returning back to hotel/motel after discharge from SNF. Patient states he understands.   CSW provided patient with inpatient and out patient substance abuse resources. Patient states he has been drinking alcohol for years, since "1979". Patient states he wants to stop. Patient states he has stopped before and was alcohol and drug free for 8 years. Patient states his divorce caused him to relapse.   Patient/family is realistic regarding therapy needs and expressed being hopeful for SNF placement. Patient expressed understanding of CSW role and discharge process as well as medical condition. No questions/concerns about plan or treatment at this time.   Antony Blackbird, MSW, [facility] Clinical Social Worker [phone]      Expected Discharge Plan: Skilled Nursing Facility Barriers to Discharge: Active Substance Use - Placement, SNF Pending bed offer  Expected Discharge Plan and Services Expected Discharge Plan: Skilled Nursing Facility    Discharge Planning Services: CM Consult   Living arrangements for the past 2 months: Hotel/Motel                                       Social Determinants of Health (SDOH) Interventions    Readmission Risk Interventions Readmission Risk Prevention Plan 04/29/2019 04/29/2019  Transportation Screening - Complete  Medication Review Oceanographer) Complete Complete  PCP or Specialist appointment within 3-5 days of discharge (No Data) -  HRI or Home Care Consult Complete -  SW Recovery Care/Counseling Consult Complete -  Palliative Care Screening Not Applicable -  Skilled Nursing Facility Not Complete -  Some recent data might be hidden
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
## 2019-05-01 NOTE — Progress Notes (Addendum)
Subjective: Patient was seen and evaluated at bedside on morning rounds.  Heart rate increased overnight. He endorses some palpitation yesterday and a little bit of chest pain last night. He has been asymptomatic since then.  Objective:  Vital signs in last 24 hours: Vitals:   04/30/19 1805 04/30/19 1951 04/30/19 2343 05/01/19 0339  BP: (!) 134/124 134/87 (!) 156/86 (!) 159/75  Pulse: (!) 28 (!) 119 (!) 105   Resp: (!) 30 (!) 25 (!) 27 (!) 21  Temp:  97.8 F (36.6 C) 97.9 F (36.6 C) 98.2 F (36.8 C)  TempSrc:  Oral Oral Oral  SpO2: 100% 100% 100%   Weight:      Height:       Physicalexam: General: No acute distress. Looks weak and Ill appearing.   BH:[id number] rhythm, mildly tachycardic Pulmonary exam: No respiratory distress, Has some rhinchi Abdomen: Is soft and nontender to palpation BS are present Neurology exam: Is alert and oriented x3 Extremities: No lower extremity edema, pulses are normal  Assessment/Plan:  Principal Problem:   Chest pain Active Problems:   Coronary artery disease   Hypertension   Tobacco abuse   Alcohol abuse   Diabetes mellitus without complication (HCC)   Cocaine abuse (HCC)   Atrial fibrillation (HCC)   63 year old male with past medical history of CAD status post stent, DM HTN, history of CVA(left side residual weakness),alcohol and cocaine use, smoking, MDD came to the emergency department today due toworsening of intermittentchest pain.  ACS rule out/ Chest pain in setting of cocaine use : UDS positive for cocasine Nuclear stress test 04/30/2019 showed: Fixed inferior wall defect. Mild global hypokinesis. No significant reversible ischemia with pharmacologic stress. LVEF 40%.  TTE showed moderate AR. Chest pain improved. No pain currently.  Strongly recommenced stopping substance use. Social worker consulted for substance abuse. He also has MDD that plays a role in continuing Cocaine use.  -Continue home ASA 80 mg QD and  Plavix 75 mg QD -Continue home Lipitor  -CMPdaily -Cardiology is on board. Appreciate recommendation about if further ischemic work up is required--> Recommended conservative management -f/u for moderate aortic regurgitation. -F/u SNF placement--> Needs psychiatry consult before placement.   A-fib with RVR Reports Hx of Afib before and has been on Coreg at home. Started on Diltiazem 30 q 6h during this hospitalization. Diltiazem increased to 60 mg q6h due to Afib with RVR overnight with HR 120s-140s overnight. Although betablocker is not preferred in him in setting of Cocaine abuse, he has been on that prior to admission and can be resumed per Dr, Allyson Sabal. -Switching Diltiazem to home Coreg before DC to see rate control response -Per cardiology, do not think that he is a good candidate for oral anticoagulation -Cardiac monitoring  AKI: Resolved.   Normocytic Anemia: Hb stable at 11.6 previous Hb 2 weeks ago was 12.3. No evidence of bleeding. Ferritinis 80 and iron studies andVit B12will are normal. Hb stable at 11.8 today. With Ferritin 80. IDA is still possible.   -CBC daily -Monitor CBC outpatient -Restart home ferrous sulfate -Continue folic acid  DM II:On Metfromin 100 mg BID at home.BG 191 this AM  -Continue Sensitive SSI  -CBG monitoring  Alcohol use:   -CIWA with Ativan -Thiamin 100 mg IV QD -Continue folic acid  MDD, Hx of suicidal thoughts: With recent admission to behavioral health due to suicidal thought. Has been prescribed medications but he has stopped them and mentions that it did not help him. No sucidal  thoughts currently but mentions that it may come and go. -Continue sitter -Restarting home Abilify 2 mg QD and Vortioxetin 10 mg QD - Ambulatory referral to psychiatry. Recommending close f/u -Psychiatry consulted for evaluation before DC to SNF. Appreciate recommendation  Diet:HH carb mod IV fluid:None VTE ppx:Sub Q Hep Code  status:Full   Dispo: Anticipated discharge in approximately 1-2 days  Chevis PrettyMasoudi, Daley Mooradian, MD 05/01/2019, 6:14 AM Pager:[phone]

## 2019-05-01 NOTE — Progress Notes (Signed)
Progress Note  Patient Name: William Gonzalez Date of Encounter: 05/01/2019  Primary Cardiologist: Dr. Nanetta BattyJonathan Brailyn Gonzalez  Subjective   Complains of atypical chest pain this morning  Inpatient Medications    Scheduled Meds: . ARIPiprazole  2 mg Oral Daily  . atorvastatin  40 mg Oral q1800  . clopidogrel  75 mg Oral Daily  . diltiazem  60 mg Oral Q6H  . folic acid  1 mg Oral Daily  . heparin  5,000 Units Subcutaneous Q8H  . insulin aspart  0-5 Units Subcutaneous QHS  . insulin aspart  0-9 Units Subcutaneous TID WC  . pantoprazole  40 mg Oral Daily  . ramelteon  8 mg Oral QHS  . thiamine  100 mg Oral Daily   Continuous Infusions:  PRN Meds: acetaminophen **OR** acetaminophen, LORazepam   Vital Signs    Vitals:   04/30/19 1951 04/30/19 2343 05/01/19 0339 05/01/19 0728  BP: 134/87 (!) 156/86 (!) 159/75 (!) 151/82  Pulse: (!) 119 (!) 105  77  Resp: (!) 25 (!) 27 (!) 21 15  Temp: 97.8 F (36.6 C) 97.9 F (36.6 C) 98.2 F (36.8 C) 98.2 F (36.8 C)  TempSrc: Oral Oral Oral Oral  SpO2: 100% 100%  99%  Weight:      Height:        Intake/Output Summary (Last 24 hours) at 05/01/2019 0808 Last data filed at 05/01/2019 0546 Gross per 24 hour  Intake 150 ml  Output 375 ml  Net -225 ml   Last 3 Weights 04/28/2019 04/20/2019 04/08/2019  Weight (lbs) 160 lb 159 lb 13.3 oz 160 lb  Weight (kg) 72.576 kg 72.5 kg 72.576 kg  Some encounter information is confidential and restricted. Go to Review Flowsheets activity to see all data.      Telemetry    Atrial fibrillation with rapid ventricular response- Personally Reviewed  ECG    No new EKG was performed today  Physical Exam   GEN: No acute distress.   Neck: No JVD Cardiac: RRR, no murmurs, rubs, or gallops.  Respiratory: Clear to auscultation bilaterally. GI: Soft, nontender, non-distended  MS: No edema; No deformity. Neuro:  Nonfocal  Psych: Normal affect   Labs    Chemistry Recent Labs  Lab 04/29/19 0240  04/30/19 0603 05/01/19 0156  NA 138 140 138  K 3.8 3.6 4.2  CL 110 104 105  CO2 19* 24 22  GLUCOSE 268* 141* 195*  BUN 24* 12 14  CREATININE 1.30* 0.90 1.02  CALCIUM 8.9 9.2 8.9  PROT 5.9* 6.3* 6.2*  ALBUMIN 3.2* 3.4* 3.3*  AST 12* 13* 14*  ALT 11 12 14   ALKPHOS 107 119 115  BILITOT 0.5 0.6 0.5  GFRNONAA 57* >60 >60  GFRAA >60 >60 >60  ANIONGAP 9 12 11      Hematology Recent Labs  Lab 04/29/19 0240 04/30/19 0603 05/01/19 0156  WBC 11.7* 15.6* 15.6*  RBC 3.23* 3.55* 3.57*  HGB 10.7* 11.8* 11.8*  HCT 31.1* 33.8* 34.2*  MCV 96.3 95.2 95.8  MCH 33.1 33.2 33.1  MCHC 34.4 34.9 34.5  RDW 11.9 11.9 11.9  PLT 349 352 357    Cardiac Enzymes Recent Labs  Lab 04/28/19 0723 04/28/19 1023 04/28/19 1955 04/29/19 0240  TROPONINI <0.03 <0.03 <0.03 <0.03   No results for input(s): TROPIPOC in the last 168 hours.   BNPNo results for input(s): BNP, PROBNP in the last 168 hours.   DDimer No results for input(s): DDIMER in the last 168 hours.  Radiology    Nm Myocar Multi W/spect W/wall Motion / Ef  Result Date: 04/30/2019 CLINICAL DATA:  Chest pain EXAM: MYOCARDIAL IMAGING WITH SPECT (REST AND PHARMACOLOGIC-STRESS) GATED LEFT VENTRICULAR WALL MOTION STUDY LEFT VENTRICULAR EJECTION FRACTION TECHNIQUE: Standard myocardial SPECT imaging was performed after resting intravenous injection of 10 mCi Tc-42m tetrofosmin. Subsequently, intravenous infusion of Lexiscan was performed under the supervision of the Cardiology staff. At peak effect of the drug, 30 mCi Tc-10m tetrofosmin was injected intravenously and standard myocardial SPECT imaging was performed. Quantitative gated imaging was also performed to evaluate left ventricular wall motion, and estimate left ventricular ejection fraction. COMPARISON:  None. FINDINGS: Perfusion: Fixed inferior wall defect. No significant decreased activity in the left ventricle on stress imaging to suggest reversible ischemia or infarction. Wall Motion:  Mild global hypokinesis. No significant chamber dilatation. Left Ventricular Ejection Fraction: 40 % End diastolic volume 112 ml End systolic volume 67 ml IMPRESSION: 1. Fixed inferior wall defect. No significant reversible ischemia with pharmacologic stress. 2. Mild global hypokinesis. 3. Left ventricular ejection fraction 40% 4. Non invasive risk stratification*: Intermediate *2012 Appropriate Use Criteria for Coronary Revascularization Focused Update: J Am Coll Cardiol. 2012;59(9):857-881. http://content.dementiazones.com.aspx?articleid=[email or web address] Electronically Signed   By: William Petit.  Gonzalez M.D.   On: 04/30/2019 12:09    Cardiac Studies   None  Patient Profile     63 y.o. male with history of CAD status post stenting of his RCA 03/24/2012 at [facility].  His other problems include continued tobacco abuse, polysubstance abuse including cocaine.  Admitted with chest pain.  He did have altered mental status.  His enzymes were negative and his EKG showed no acute changes.  Assessment & Plan    1.  CAD- history of CAD status post RCA stenting at [facility] 03/24/2012.  He was admitted with chest pain which is somewhat atypical.  It was difficult to get a history from him.  His enzymes have remained negative and his EKG shows no acute changes.  A Myoview stress test was performed yesterday that showed inferior scar versus diaphragmatic attenuation with an EF of 40% although his 2D echo showed normal EF.  2.  Acute renal insufficiency-serum creatinine initially was 2.4 which has fallen to 0.9.  I suspect this is related to dehydration.  3.  Polysubstance abuse- history of cocaine use and heavy alcohol use.  He was UDS positive for cocaine.  4.  Prior history of stroke- has been maintained on Plavix  5.  A. fib with RVR- he says that he is been in A. fib in the past.  He has been rate controlled with immediate release diltiazem.  He has been on carvedilol in the past.  While this is not an appropriate shock in the  setting of cocaine abuse can be used for blood pressure and rate control as an outpatient.  I do not think he is a good oral anticoagulant candidate.    Myoview was low risk with inferior scar versus diaphragmatic attenuation.  Based on this, and his normal EF by 2D echo I favor conservative treatment.  We will sign off.  Call for further questions.  For questions or updates, please contact [facility] Please consult [email or web address] for contact info under        Signed, William Batty, MD  05/01/2019, 8:08 AM

## 2019-05-01 NOTE — Progress Notes (Signed)
Physical Therapy Treatment Patient Details Name: William Gonzalez MRN: [id number] DOB: February 21, 1956 Today's Date: 05/01/2019    History of Present Illness 63 y.o. male with history of CAD status post [facility] his [facility].  His other problems include continued tobacco abuse, polysubstance abuse including cocaine.  Admitted with chest pain.  He did have altered mental status.  His enzymes were negative and his EKG showed no acute changes.    PT Comments    Pt performed gait training and functional mobility with min to moderate assistance.  Pt continues to benefit from skilled rehab in post acute setting at d/c.  HR better controlled at 87 bpm.  Plan for progression to LE strengthening.      Follow Up Recommendations  SNF;Supervision/Assistance - 24 hour     Equipment Recommendations  None recommended by PT    Recommendations for Other Services       Precautions / Restrictions Precautions Precautions: Fall Restrictions Weight Bearing Restrictions: No    Mobility  Bed Mobility Overal bed mobility: Needs Assistance Bed Mobility: Rolling;Sidelying to Sit Rolling: Supervision Sidelying to sit: Mod assist       General bed mobility comments: Pt attempted to sit edge of bed without assistance and he was unable.  he required assistance to advance LEs to edge of bed and elevate trunk into sitting. Once in seated position he was able to scoot to edge of bed to prepare for transfer into standing.    Transfers Overall transfer level: Needs assistance Equipment used: 4-wheeled walker Transfers: Sit to/from Stand Sit to Stand: Min assist         General transfer comment: Pt required cues  pre transfer to lock brakes before standing/ sitting and to unlock brakes once in standing.  Pt required min assistance to boost into standing.    Ambulation/Gait Ambulation/Gait assistance: Min assist;+2 safety/equipment Gait Distance (Feet): 60 Feet Assistive device: 4-wheeled  walker Gait Pattern/deviations: Step-through pattern;Trunk flexed;Shuffle;Decreased stride length     General Gait Details: Pt has tendency to push rollator too far forward.  He has tendency to stop and stand and present with LOB posterior requiring min assistance to maintain balance.     Stairs             Wheelchair Mobility    Modified Rankin (Stroke Patients Only)       Balance Overall balance assessment: Needs assistance Sitting-balance support: No upper extremity supported;Feet supported Sitting balance-Leahy Scale: Fair       Standing balance-Leahy Scale: Poor Standing balance comment: relies on UE support                            Cognition Arousal/Alertness: Awake/alert Behavior During Therapy: WFL for tasks assessed/performed Overall Cognitive Status: Within Functional Limits for tasks assessed                                        Exercises      General Comments        Pertinent Vitals/Pain Pain Assessment: Faces Faces Pain Scale: Hurts even more Pain Location: B feet and back Pain Descriptors / Indicators: Discomfort;Grimacing;Guarding Pain Intervention(s): Monitored during session;Repositioned    Home Living                      Prior Function  PT Goals (current goals can now be found in the care plan section) Acute Rehab PT Goals Patient Stated Goal: to go to rehab at [place] Potential to Achieve Goals: Good Progress towards PT goals: Progressing toward goals    Frequency    Min 2X/week      PT Plan Current plan remains appropriate    Co-evaluation              AM-PAC PT "6 Clicks" Mobility   Outcome Measure  Help needed turning from your back to your side while in a flat bed without using bedrails?: None Help needed moving from lying on your back to sitting on the side of a flat bed without using bedrails?: None Help needed moving to and from a bed to a chair  (including a wheelchair)?: A Little Help needed standing up from a chair using your arms (e.g., wheelchair or bedside chair)?: A Little Help needed to walk in hospital room?: A Lot Help needed climbing 3-5 steps with a railing? : A Lot 6 Click Score: 18    End of Session Equipment Utilized During Treatment: Gait belt Activity Tolerance: Patient limited by fatigue Patient left: in chair;with call bell/phone within reach;with nursing/sitter in Statistician in room) Nurse Communication: Mobility status PT Visit Diagnosis: Unsteadiness on feet (R26.81);Muscle weakness (generalized) (M62.81)     Time: 6270-3500 PT Time Calculation (min) (ACUTE ONLY): 19 min  Charges:  $Gait Training: 8-22 mins                     Joycelyn Rua, PTA Acute Rehabilitation Services Pager [phone] Office [phone]     William Gonzalez 05/01/2019, 3:44 PM

## 2019-05-01 NOTE — Progress Notes (Signed)
  Date: 05/01/2019  Patient name: William Gonzalez  Medical record number: [id number]  Date of birth: 1956-05-04   I have seen and evaluated this patient and I have discussed the plan of care with the house staff. Please see their note for complete details. I concur with their findings with the following additions/corrections:   Heart rate back up a little bit this morning, no palpitations.  Nuclear stress test yesterday with fixed defect but no reversible ischemia.  Cardiology recommending continued medical management.  They do not recommend anticoagulation at this time.  We will resume carvedilol for him and see how his rate control does.  He may need to go up on his dose of carvedilol.  Plan is for SNF for rehabilitation.  Given his recent suicidal ideation, we will consult psychiatry for further evaluation prior to discharge.  Current plan is to continue outpatient psychiatric medication plan, including Abilify and vortioxetine.  Could consider increasing vortioxetine to 20 mg daily.  No doubt his substance use is also playing into his mood disorder.  [name], M.D., Ph.D. 05/01/2019, 11:46 AM

## 2019-05-01 NOTE — Progress Notes (Addendum)
Inpatient Diabetes Program Recommendations  AACE/ADA: New Consensus Statement on Inpatient Glycemic Control (2015)  Target Ranges:  Prepandial:   less than 140 mg/dL      Peak postprandial:   less than 180 mg/dL (1-2 hours)      Critically ill patients:  140 - 180 mg/dL    Results for KENNER, AGRO (MRN [id number]) as of 05/01/2019 07:23  Ref. Range 04/30/2019 06:07 04/30/2019 11:24 04/30/2019 16:03 04/30/2019 21:37  Glucose-Capillary Latest Ref Range: 70 - 99 mg/dL 485 (H)  Pt Refused Novolog SSI 369 (H)  Novolog SSI not documentedas being given 258 (H)  5 units NOVOLOG  244 (H)  2 units NOVOLOG    Results for JITENDER, HANDEL (MRN [id number]) as of 05/01/2019 07:23  Ref. Range 05/01/2019 06:04  Glucose-Capillary Latest Ref Range: 70 - 99 mg/dL 938 (H)     Home DM Meds: Metformin XR 1000 mg BID  Current Orders: Novolog Sensitive Correction Scale/ SSI (0-9 units) TID AC + HS     MD- Please note that pt refused to take Novolog SSI yesterday AM.  Then at 12pm, Novolog not documented as being given.  Pt did agree to take Novolog at 5pm and bedtime.  DM Coordinator spoke with pt on 05/06 about the importance of keeping CBGs controlled while in hospital.     --Will follow patient during hospitalization--  Ambrose Finland RN, MSN, CDE Diabetes Coordinator Inpatient Glycemic Control Team Team Pager: [phone] (8a-5p)

## 2019-05-02 LAB — GLUCOSE, CAPILLARY
Glucose-Capillary: 149 mg/dL — ABNORMAL HIGH (ref 70–99)
Glucose-Capillary: 210 mg/dL — ABNORMAL HIGH (ref 70–99)
Glucose-Capillary: 222 mg/dL — ABNORMAL HIGH (ref 70–99)
Glucose-Capillary: 224 mg/dL — ABNORMAL HIGH (ref 70–99)
Glucose-Capillary: 178 mg/dL — ABNORMAL HIGH (ref 70–99)

## 2019-05-02 LAB — COMPREHENSIVE METABOLIC PANEL
ALT: 12 U/L (ref 0–44)
AST: 11 U/L — ABNORMAL LOW (ref 15–41)
Albumin: 3.2 g/dL — ABNORMAL LOW (ref 3.5–5.0)
Alkaline Phosphatase: 109 U/L (ref 38–126)
Anion gap: 11 (ref 5–15)
BUN: 13 mg/dL (ref 8–23)
CO2: 24 mmol/L (ref 22–32)
Calcium: 9 mg/dL (ref 8.9–10.3)
Chloride: 103 mmol/L (ref 98–111)
Creatinine, Ser: 0.98 mg/dL (ref 0.61–1.24)
GFR calc Af Amer: >60 mL/min (ref >60)
GFR calc non Af Amer: >60 mL/min (ref >60)
Glucose, Bld: 248 mg/dL — ABNORMAL HIGH (ref 70–99)
Potassium: 4.1 mmol/L (ref 3.5–5.1)
Sodium: 138 mmol/L (ref 135–145)
Total Bilirubin: 0.4 mg/dL (ref 0.3–1.2)
Total Protein: 6 g/dL — ABNORMAL LOW (ref 6.5–8.1)

## 2019-05-02 LAB — CBC
HCT: 32.6 % — ABNORMAL LOW (ref 39.0–52.0)
Hemoglobin: 11.2 g/dL — ABNORMAL LOW (ref 13.0–17.0)
MCH: 33 pg (ref 26.0–34.0)
MCHC: 34.4 g/dL (ref 30.0–36.0)
MCV: 96.2 fL (ref 80.0–100.0)
Platelets: 371 10*3/uL (ref 150–400)
RBC: 3.39 MIL/uL — ABNORMAL LOW (ref 4.22–5.81)
RDW: 11.9 % (ref 11.5–15.5)
WBC: 13.5 10*3/uL — ABNORMAL HIGH (ref 4.0–10.5)
nRBC: 0 % (ref 0.0–0.2)

## 2019-05-02 LAB — PHOSPHORUS: Phosphorus: 2.4 mg/dL — ABNORMAL LOW (ref 2.5–4.6)

## 2019-05-02 MED ORDER — CARVEDILOL 25 MG PO TABS
25.0000 mg | ORAL_TABLET | Freq: Two times a day (BID) | ORAL | Status: DC
  Administered 2019-05-02 – 2019-05-11 (×17): 25 mg via ORAL
  Filled 2019-05-02 (×18): qty 1

## 2019-05-02 MED ORDER — IBUPROFEN 200 MG PO TABS
200.0000 mg | ORAL_TABLET | Freq: Once | ORAL | Status: AC
  Administered 2019-05-02: 22:00:00 200 mg via ORAL
  Filled 2019-05-02: qty 1

## 2019-05-02 MED ORDER — AMANTADINE HCL 100 MG PO CAPS
100.0000 mg | ORAL_CAPSULE | Freq: Two times a day (BID) | ORAL | Status: DC
  Administered 2019-05-02 – 2019-05-11 (×19): 100 mg via ORAL
  Filled 2019-05-02 (×21): qty 1

## 2019-05-02 MED ORDER — CARVEDILOL 25 MG PO TABS: 25.0000 mg | ORAL_TABLET | Freq: Two times a day (BID) | ORAL | Status: DC

## 2019-05-02 NOTE — Progress Notes (Signed)
Faxed out referrals for SNF rehab. Awaiting potential bed offers. CSW will continue to assist as needed and facilitate discharge when appropriate.

## 2019-05-02 NOTE — Progress Notes (Signed)
Internal Medicine Attending:   I saw and examined the patient. I reviewed Dr Enis Gash note and I agree with the resident's findings and plan as documented in the resident's note. No further chest pain no further inpatient work-up continue on medications as noted.  Other issues are all stable and our plan is to discharge to skilled nursing facility.

## 2019-05-02 NOTE — NC FL2 (Signed)
[place] MEDICAID FL2 LEVEL OF CARE SCREENING TOOL     IDENTIFICATION  Patient Name: William Gonzalez Birthdate: [birth year] Sex: male Admission Date (Current Location): 04/28/2019  [facility] and [place] Number:  Producer, television/film/video and Address:  [facility], [address], [place], [place] [address]      Provider Number: [id number]  Attending Physician Name and Address:  Anne Shutter, MD  Relative Name and Phone Number:  Rangel, [place] [phone]    Current Level of Care: Hospital Recommended Level of Care: Skilled Nursing Facility Prior Approval Number:    Date Approved/Denied:   PASRR Number:    Discharge Plan: SNF    Current Diagnoses: Patient Active Problem List   Diagnosis Date Noted  . Substance induced mood disorder (HCC)   . Atrial fibrillation (HCC) 04/29/2019  . Chest pain 04/28/2019  . MDD (major depressive disorder), recurrent episode (HCC) 04/21/2019  . Suicidal ideation   . Alcohol dependence (HCC) 04/13/2019  . MDD (major depressive disorder), recurrent severe, without psychosis (HCC) 04/08/2019  . Anemia 04/18/2018  . Ataxic gait   . Cocaine abuse (HCC)   . Cerebellar infarct (HCC) 04/21/2015  . Tobacco abuse 04/21/2015  . Alcohol abuse 04/21/2015  . Stroke (HCC) 04/21/2015  . Diabetes mellitus without complication (HCC) 04/21/2015  . Coronary artery disease   . Hypertension     Orientation RESPIRATION BLADDER Height & Weight     Self, Time, Situation, Place  Normal Continent Weight: 160 lb (72.6 kg) Height:  5\' 5"  (165.1 cm)  BEHAVIORAL SYMPTOMS/MOOD NEUROLOGICAL BOWEL NUTRITION STATUS      Continent (heart diet thin fluids)  AMBULATORY STATUS COMMUNICATION OF NEEDS Skin   Limited Assist Verbally Normal                       Personal Care Assistance Level of Assistance  Bathing, Feeding, Dressing Bathing Assistance: Limited assistance Feeding assistance: Limited assistance Dressing Assistance:  Limited assistance     Functional Limitations Info  Sight, Hearing, Speech Sight Info: Adequate Hearing Info: Adequate Speech Info: Adequate    SPECIAL CARE FACTORS FREQUENCY  PT (By licensed PT), OT (By licensed OT)     PT Frequency: 2x OT Frequency: 2x            Contractures Contractures Info: Present    Additional Factors Info  Code Status Code Status Info: (Full Code)             Current Medications (05/02/2019):  This is the current hospital active medication list Current Facility-Administered Medications  Medication Dose Route Frequency Provider Last Rate Last Dose  . acetaminophen (TYLENOL) tablet 650 mg  650 mg Oral Q6H PRN Angelita Ingles, MD   650 mg at 05/02/19 0431   Or  . acetaminophen (TYLENOL) suppository 650 mg  650 mg Rectal Q6H PRN Angelita Ingles, MD      . amantadine (SYMMETREL) capsule 100 mg  100 mg Oral BID Masoudi, Elhamalsadat, MD      . ARIPiprazole (ABILIFY) tablet 2 mg  2 mg Oral Daily Masoudi, Elhamalsadat, MD   2 mg at 05/01/19 1032  . atorvastatin (LIPITOR) tablet 40 mg  40 mg Oral q1800 Angelita Ingles, MD   40 mg at 05/01/19 1729  . carvedilol (COREG) tablet 25 mg  25 mg Oral BID WC Masoudi, Elhamalsadat, MD      . folic acid (FOLVITE) tablet 1 mg  1 mg Oral Daily  Silvana NewnessMeyer, Andrew D, RPH   1 mg at 05/01/19 1032  . heparin injection 5,000 Units  5,000 Units Subcutaneous Q8H Angelita InglesWinfrey, William B, MD   5,000 Units at 05/02/19 [id number]  . insulin aspart (novoLOG) injection 0-5 Units  0-5 Units Subcutaneous QHS Angelita InglesWinfrey, William B, MD   2 Units at 04/30/19 2227  . insulin aspart (novoLOG) injection 0-9 Units  0-9 Units Subcutaneous TID WC Angelita InglesWinfrey, William B, MD   3 Units at 05/02/19 [id number]  . LORazepam (ATIVAN) injection 2-3 mg  2-3 mg Intravenous Q1H PRN Angelita InglesWinfrey, William B, MD   2 mg at 05/01/19 0102  . pantoprazole (PROTONIX) EC tablet 40 mg  40 mg Oral Daily Angelita InglesWinfrey, William B, MD   40 mg at 05/01/19 1033  . ramelteon (ROZEREM) tablet 8 mg  8  mg Oral QHS Angelita InglesWinfrey, William B, MD   8 mg at 05/01/19 2226  . thiamine (VITAMIN B-1) tablet 100 mg  100 mg Oral Daily Anne Shutteraines, Alexander N, MD   100 mg at 05/01/19 1033  . vortioxetine HBr (TRINTELLIX) tablet 10 mg  10 mg Oral Daily Masoudi, Elhamalsadat, MD   10 mg at 05/01/19 1033     Discharge Medications: Please see discharge summary for a list of discharge medications.  Relevant Imaging Results:  Relevant Lab Results:   Additional Information SN: [id number]  Mamie NickMahamadu Jahmani Staup, LCSW

## 2019-05-02 NOTE — Progress Notes (Signed)
Subjective: Patient was seen and evaluated at bedside this morning.  No acute events overnight.  He feels much better today.  Denies any chest pain.  Palpitation is better.  Objective:  Vital signs in last 24 hours: Vitals:   05/01/19 1951 05/01/19 2224 05/01/19 2344 05/02/19 0418  BP: (!) 142/74 (!) 159/60 119/65 (!) 145/85  Pulse: 80 78  83  Resp: 18   16  Temp: 97.8 F (36.6 C)  97.8 F (36.6 C) 98.2 F (36.8 C)  TempSrc: Oral  Oral Oral  SpO2: 100%   99%  Weight:      Height:       Physicalexam: General: No acute distress. Looks weak and Ill appearing.   CV:Has regular rhythm now,HR is normal currently  Pulmonary exam: No respiratory distress, Has some rhinchi Abdomen: Is soft and nontender to palpation BS are present Neurology exam: Is alert and oriented x3 Extremities: No lower extremity edema, pulses are normal   Assessment/Plan:  Principal Problem:   Substance induced mood disorder (HCC) Active Problems:   Coronary artery disease   Hypertension   Tobacco abuse   Alcohol abuse   Diabetes mellitus without complication (HCC)   Cocaine abuse (HCC)   Suicidal ideation   Chest pain   Atrial fibrillation (HCC)   63 year old male with past medical history of CAD status post stent, DM HTN, history of CVA(left side residual weakness),alcohol and cocaine use, smoking, MDD came to the emergency department today due toworsening of intermittentchest pain.  ACS rule out/ Chest pain in setting of cocaine use :UDS positive for cocasine Nuclear stress test5/7/2020showed:Fixed inferior wall defect. Mild global hypokinesis. No significant reversible ischemia with pharmacologic stress.LVEF40%. TTE showed moderate AR.  Chest pain resolved. Per Dr. Allyson SabalBerry, cardiology, given stress test result with low risk, no further ischemic work up at this point required and recommended   Strongly recommending stopping substance use. Social worker consulted for substance  abuse. He also has MDD that plays a role in continuing Cocaine use. Asymptomatic currently and is a stable.  Rate is controlled.  Medically ready to discharge to SNF.  -Continue home ASA 80 mg QD and Plavix 75 mg QD -Continue home Lipitor  -CMPdaily -Appreciate social worker f/u for SNF placement.  A-fibwith RVR Reports Hx of Afib before and has been on Coreg at home. Started on Diltiazem 30q 6hduring this hospitalization.  Which switched to home Coreg yesterday.  (Although betablocker is not preferred in him in setting of Cocaine abuse, he has been on that prior to admission and could be resumed per Dr, Allyson SabalBerry.) Return to sinus rhythm last night.  Had few episodes of SVT that resolved.  Rate is also controlled. Per cardiology, do not think that he is a good candidate for oral anticoagulation  -Cardiac monitoring -Continue carvedilol 25 mg twice daily  AKI: Resolved.   Normocytic Anemia: Hb stable previous Hb 2 weeks ago was 12.3. No evidence of bleeding. Ferritinis 80and iron studies andVit B12will are normal. Hb stable at 11.8 today. With Ferritin 80. IDA is still possible.  -CBC daily -Monitor CBC outpatient -Restart home ferrous sulfate -Continue folic acid  DM II:On Metfromin 100 mg BID at home.  -Continue Sensitive SSI  -CBG monitoring  Alcohol use:   -CIWA with Ativan -Thiamin 100 mg IV QD -Continue folic acid  MDD, Hx of suicidal thoughts: With recent admission to behavioral health due to suicidal thought. Has been prescribed medications but he has stopped them and mentions that it  did not help him. No sucidal thoughts currently but mentions that it may come and go. psychiatry consulted yesterday and recommended- to continue Amantadine 100 mg BID for EPS prophylaxis, Abilify 2 mg daily for mood stabilization, Ramelteon 8 mg qhs for insomnia and Trintellix 10 mg daily for mood. Per psychiatry evaluation, patient does not meet criteria for  psychiatric inpatient admission  -Restarting Amantadin 100 mg BID -Continue home Abilify 2 mg QD and Vortioxetin 10 mg QD -Close follow-up with psychiatry out patient. -Appreciate social worker follow-up for SNF placement as well as providing resources for substance abuse treatment  Diet:HH carb mod IV fluid:None VTE ppx:Sub Q Hep Code status:Full  Dispo: Discharge pending SNF placement  Chevis Pretty, MD 05/02/2019, 4:40 AM Pager: [phone]

## 2019-05-03 LAB — CBC WITH DIFFERENTIAL/PLATELET
Abs Immature Granulocytes: 0.17 10*3/uL — ABNORMAL HIGH (ref 0.00–0.07)
Basophils Absolute: 0 10*3/uL (ref 0.0–0.1)
Basophils Relative: 0 %
Eosinophils Absolute: 0.1 10*3/uL (ref 0.0–0.5)
Eosinophils Relative: 1 %
HCT: 30.8 % — ABNORMAL LOW (ref 39.0–52.0)
Hemoglobin: 10.6 g/dL — ABNORMAL LOW (ref 13.0–17.0)
Immature Granulocytes: 1 %
Lymphocytes Relative: 4 %
Lymphs Abs: 0.9 10*3/uL (ref 0.7–4.0)
MCH: 32.7 pg (ref 26.0–34.0)
MCHC: 34.4 g/dL (ref 30.0–36.0)
MCV: 95.1 fL (ref 80.0–100.0)
Monocytes Absolute: 1.1 10*3/uL — ABNORMAL HIGH (ref 0.1–1.0)
Monocytes Relative: 5 %
Neutro Abs: 17.7 10*3/uL — ABNORMAL HIGH (ref 1.7–7.7)
Neutrophils Relative %: 89 %
Platelets: 363 10*3/uL (ref 150–400)
RBC: 3.24 MIL/uL — ABNORMAL LOW (ref 4.22–5.81)
RDW: 11.8 % (ref 11.5–15.5)
WBC: 20 10*3/uL — ABNORMAL HIGH (ref 4.0–10.5)
nRBC: 0 % (ref 0.0–0.2)

## 2019-05-03 LAB — URINALYSIS, ROUTINE W REFLEX MICROSCOPIC
Bacteria, UA: NONE SEEN
Bilirubin Urine: NEGATIVE
Glucose, UA: NEGATIVE mg/dL
Ketones, ur: 20 mg/dL — AB
Leukocytes,Ua: NEGATIVE
Nitrite: NEGATIVE
Protein, ur: 100 mg/dL — AB
Specific Gravity, Urine: 1.018 (ref 1.005–1.030)
pH: 5 (ref 5.0–8.0)

## 2019-05-03 LAB — BASIC METABOLIC PANEL
Anion gap: 12 (ref 5–15)
BUN: 15 mg/dL (ref 8–23)
CO2: 25 mmol/L (ref 22–32)
Calcium: 8.8 mg/dL — ABNORMAL LOW (ref 8.9–10.3)
Chloride: 96 mmol/L — ABNORMAL LOW (ref 98–111)
Creatinine, Ser: 1.19 mg/dL (ref 0.61–1.24)
GFR calc Af Amer: >60 mL/min (ref >60)
GFR calc non Af Amer: >60 mL/min (ref >60)
Glucose, Bld: 227 mg/dL — ABNORMAL HIGH (ref 70–99)
Potassium: 4.2 mmol/L (ref 3.5–5.1)
Sodium: 133 mmol/L — ABNORMAL LOW (ref 135–145)

## 2019-05-03 LAB — GLUCOSE, CAPILLARY
Glucose-Capillary: 199 mg/dL — ABNORMAL HIGH (ref 70–99)
Glucose-Capillary: 247 mg/dL — ABNORMAL HIGH (ref 70–99)
Glucose-Capillary: 111 mg/dL — ABNORMAL HIGH (ref 70–99)
Glucose-Capillary: 176 mg/dL — ABNORMAL HIGH (ref 70–99)

## 2019-05-03 MED ORDER — DICLOFENAC SODIUM 1 % TD GEL
2.0000 g | Freq: Three times a day (TID) | TRANSDERMAL | Status: DC | PRN
  Administered 2019-05-03 – 2019-05-10 (×12): 2 g via TOPICAL
  Filled 2019-05-03: qty 100

## 2019-05-03 MED ORDER — POTASSIUM & SODIUM PHOSPHATES 280-160-250 MG PO PACK
1.0000 | PACK | Freq: Two times a day (BID) | ORAL | Status: AC
  Administered 2019-05-03 – 2019-05-04 (×2): 1 via ORAL
  Filled 2019-05-03 (×3): qty 1

## 2019-05-03 NOTE — Progress Notes (Signed)
Subjective: Patient was seen and evaluated at bedside on morning rounds. He complains of left shoulder pain. It worsens when he moves his arm. He says that he thinks he twisted that yesterday. He mentions that Voltaren gel helped with that. He asks if he can get Tylenol because he was told that it is not good to take it a lot. All his questions were addressed. He denies palpitation and chest pain.  Objective:  Vital signs in last 24 hours: Vitals:   05/02/19 2300 05/03/19 0000 05/03/19 0300 05/03/19 0400  BP: 116/82 116/83 (!) 168/92   Pulse: 75 72 79   Resp: 18 19 (!) 25 (!) 26  Temp: 97.9 F (36.6 C)  98.5 F (36.9 C)   TempSrc: Oral  Oral   SpO2: 98% 98%    Weight:      Height:       Physicalexam: General: No acute distress. Looks weak and Ill appearing. CV:RRR (some ectopy during the exam and when he talks noticed on monitor) Pulmonary exam: No respiratory distress, no crackle, no wheezing. Abdomen: Is soft and nontender to palpation BS are present Neurology exam: Is alert and oriented x3 Musculoskeletal: Rt shoulder Rom is moderately painful, has some tenderness at right shoulder blade. No lower extremity edema, pulses are normal  Assessment/Plan:  Principal Problem:   Substance induced mood disorder (HCC) Active Problems:   Coronary artery disease   Hypertension   Tobacco abuse   Alcohol abuse   Diabetes mellitus without complication (HCC)   Cocaine abuse (HCC)   Suicidal ideation   Chest pain   Atrial fibrillation (HCC)  63 year old male with past medical history of CAD status post stent, DM HTN, history of CVA(left side residual weakness),alcohol and cocaine use, smoking, MDD came to the emergency department today due toworsening of intermittentchest pain.  ACS rule out/ Chest pain in setting of cocaine use :UDS positive for cocasine Nuclear stress test5/7/2020showed:Fixed inferior wall defect. Mild global hypokinesis. No significant reversible  ischemia with pharmacologic stress.LVEF40%. TTE showed moderate AR.  Chest pain resolved. Per Dr. Allyson SabalBerry, cardiology, given stress test result with low risk, no further ischemic work up at this point required and recommended   Strongly recommending stopping substance use. Social worker consulted for substance abuse. He also has MDD that plays a role in continuing Cocaine use. Asymptomatic currently and is a stable.  Rate is controlled.  Medically ready to discharge to SNF.  -Continue home ASA 80 mg QD and Plavix 75 mg QD -Continue home Lipitor  -BMPdaily -Appreciate social worker f/u for SNF placement.  A-fibwith RVR Reports Hx of Afib before and has been on Coreg at home. Started onDiltiazem 30q 6hduring thishospitalization.  Which switched to home Coreg yesterday.  (Although betablocker is not preferred in him in setting of Cocaine abuse, he has been on that prior to admission and could be resumed per Dr, Allyson SabalBerry.) Return to sinus rhythm last night.  Had few episodes of SVT that resolved.  Rate is also controlled. Per cardiology, do not think that he is a good candidate for oral anticoagulation  -Cardiac monitoring -Continue carvedilol 25 mg twice daily  AKI: Resolved.   Normocytic Anemia: Hbstable previous Hb 2 weeks ago was 12.3. No evidence of bleeding. Ferritinis 80and iron studies andVit B12will are normal. Hb stable at 11.8 today. With Ferritin 80. IDA is still possible.  -CBC daily -Monitor CBC outpatient -Restarthome ferrous sulfate -Continue folic acid  DM II:On Metfromin 100 mg BID at home.  -  ContinueSensitive SSI  -CBG monitoring  Alcohol use:   -CIWA with Ativan -Thiamin 100 mg IV QD -Continue folic acid  MDD, Hx of suicidal thoughts: Mood is much improved in past 2 days. No suicidal though. Psychiatry visited her in hospital and cleared him for SNF. Continuing home meds.  -DC sitter -Continue with Amantadin 100 mg  BID -Continue home Abilify 2 mg QD and Vortioxetin 10 mg QD -Close follow-up withpsychiatry out patient. -Appreciate social worker follow-up for SNF placement as well as providing resources for substance abuse treatment  Diet:HH carb mod IV fluid:None VTE ppx:Sub Q Hep Code status:Full  Dispo: Discharge pending SNF placement  Chevis Pretty, MD 05/03/2019, 5:53 AM Pager: [phone]

## 2019-05-03 NOTE — Progress Notes (Signed)
Confirmed discontinue order for 1:1 suicide sitter w/Dr. Maryla Morrow - confirms she feels patient no longer needs suicide sitter.  Will continue to monitor closely.

## 2019-05-03 NOTE — Progress Notes (Signed)
Internal Medicine Attending:   I saw and examined the patient. I reviewed Dr Enis Gash note and I agree with the resident's findings and plan as documented in the resident's note.  Mood remains stable no suicidal thoughts.  We will discontinue safety sitter at this time.  He is otherwise cleared for discharge to skilled nursing facility.

## 2019-05-04 ENCOUNTER — Inpatient Hospital Stay (HOSPITAL_COMMUNITY): Payer: Medicare Other

## 2019-05-04 DIAGNOSIS — M25511 Pain in right shoulder: Secondary | ICD-10-CM

## 2019-05-04 DIAGNOSIS — F1490 Cocaine use, unspecified, uncomplicated: Secondary | ICD-10-CM

## 2019-05-04 DIAGNOSIS — I080 Rheumatic disorders of both mitral and aortic valves: Secondary | ICD-10-CM

## 2019-05-04 DIAGNOSIS — Z955 Presence of coronary angioplasty implant and graft: Secondary | ICD-10-CM

## 2019-05-04 DIAGNOSIS — Z7289 Other problems related to lifestyle: Secondary | ICD-10-CM

## 2019-05-04 DIAGNOSIS — F17210 Nicotine dependence, cigarettes, uncomplicated: Secondary | ICD-10-CM

## 2019-05-04 DIAGNOSIS — R7881 Bacteremia: Secondary | ICD-10-CM

## 2019-05-04 DIAGNOSIS — B955 Unspecified streptococcus as the cause of diseases classified elsewhere: Secondary | ICD-10-CM

## 2019-05-04 DIAGNOSIS — H5500 Unspecified nystagmus: Secondary | ICD-10-CM

## 2019-05-04 DIAGNOSIS — E119 Type 2 diabetes mellitus without complications: Secondary | ICD-10-CM

## 2019-05-04 DIAGNOSIS — K0889 Other specified disorders of teeth and supporting structures: Secondary | ICD-10-CM

## 2019-05-04 DIAGNOSIS — F329 Major depressive disorder, single episode, unspecified: Secondary | ICD-10-CM

## 2019-05-04 DIAGNOSIS — R011 Cardiac murmur, unspecified: Secondary | ICD-10-CM

## 2019-05-04 DIAGNOSIS — I69354 Hemiplegia and hemiparesis following cerebral infarction affecting left non-dominant side: Secondary | ICD-10-CM

## 2019-05-04 LAB — BASIC METABOLIC PANEL
Anion gap: 12 (ref 5–15)
BUN: 16 mg/dL (ref 8–23)
CO2: 21 mmol/L — ABNORMAL LOW (ref 22–32)
Calcium: 9.1 mg/dL (ref 8.9–10.3)
Chloride: 101 mmol/L (ref 98–111)
Creatinine, Ser: 1.16 mg/dL (ref 0.61–1.24)
GFR calc Af Amer: >60 mL/min (ref >60)
GFR calc non Af Amer: >60 mL/min (ref >60)
Glucose, Bld: 204 mg/dL — ABNORMAL HIGH (ref 70–99)
Potassium: 4.1 mmol/L (ref 3.5–5.1)
Sodium: 134 mmol/L — ABNORMAL LOW (ref 135–145)

## 2019-05-04 LAB — GLUCOSE, CAPILLARY
Glucose-Capillary: 197 mg/dL — ABNORMAL HIGH (ref 70–99)
Glucose-Capillary: 226 mg/dL — ABNORMAL HIGH (ref 70–99)
Glucose-Capillary: 177 mg/dL — ABNORMAL HIGH (ref 70–99)
Glucose-Capillary: 170 mg/dL — ABNORMAL HIGH (ref 70–99)

## 2019-05-04 LAB — CBC WITH DIFFERENTIAL/PLATELET
Abs Immature Granulocytes: 0 10*3/uL (ref 0.00–0.07)
Basophils Absolute: 0.2 10*3/uL — ABNORMAL HIGH (ref 0.0–0.1)
Basophils Relative: 1 %
Eosinophils Absolute: 0 10*3/uL (ref 0.0–0.5)
Eosinophils Relative: 0 %
HCT: 29.2 % — ABNORMAL LOW (ref 39.0–52.0)
Hemoglobin: 10.3 g/dL — ABNORMAL LOW (ref 13.0–17.0)
Lymphocytes Relative: 3 %
Lymphs Abs: 0.7 10*3/uL (ref 0.7–4.0)
MCH: 33.6 pg (ref 26.0–34.0)
MCHC: 35.3 g/dL (ref 30.0–36.0)
MCV: 95.1 fL (ref 80.0–100.0)
Monocytes Absolute: 0.9 10*3/uL (ref 0.1–1.0)
Monocytes Relative: 4 %
Neutro Abs: 21.3 10*3/uL — ABNORMAL HIGH (ref 1.7–7.7)
Neutrophils Relative %: 92 %
Platelets: 317 10*3/uL (ref 150–400)
RBC: 3.07 MIL/uL — ABNORMAL LOW (ref 4.22–5.81)
RDW: 11.9 % (ref 11.5–15.5)
WBC: 23.2 10*3/uL — ABNORMAL HIGH (ref 4.0–10.5)
nRBC: 0 % (ref 0.0–0.2)
nRBC: 0 /100 WBC

## 2019-05-04 LAB — URINE CULTURE

## 2019-05-04 LAB — BLOOD CULTURE ID PANEL (REFLEXED)

## 2019-05-04 LAB — SARS CORONAVIRUS 2 BY RT PCR (HOSPITAL ORDER, PERFORMED IN ~~LOC~~ HOSPITAL LAB): SARS Coronavirus 2: NEGATIVE

## 2019-05-04 MED ORDER — SODIUM CHLORIDE 0.9 % IV SOLN
2.0000 g | INTRAVENOUS | Status: DC
  Administered 2019-05-04 – 2019-05-05 (×2): 2 g via INTRAVENOUS
  Filled 2019-05-04 (×3): qty 20

## 2019-05-04 MED ORDER — AMLODIPINE BESYLATE 10 MG PO TABS: 10.0000 mg | ORAL_TABLET | Freq: Every day | ORAL | Status: DC

## 2019-05-04 MED ORDER — SODIUM CHLORIDE 0.9 % IV SOLN
2.0000 g | Freq: Two times a day (BID) | INTRAVENOUS | Status: DC
  Administered 2019-05-04 (×2): 2 g via INTRAVENOUS
  Filled 2019-05-04 (×3): qty 2

## 2019-05-04 MED ORDER — VANCOMYCIN HCL 10 G IV SOLR
1250.0000 mg | INTRAVENOUS | Status: DC
  Administered 2019-05-04: 03:00:00 1250 mg via INTRAVENOUS
  Filled 2019-05-04: qty 1250

## 2019-05-04 NOTE — Progress Notes (Signed)
PHARMACY - PHYSICIAN COMMUNICATION CRITICAL VALUE ALERT - BLOOD CULTURE IDENTIFICATION (BCID)  William Gonzalez is an 63 y.o. male who presented to [facility] on 04/28/2019 with a chief complaint of chest pain. Patient started on cefepime and vancomycin 5/11 AM after patient found to be altered and febrile. Patient's WBC elevated of 23.2 and current low-grade temperature of 100.5.  Assessment:  Blood cultures from 5/10 with GPC in chains (Streptococcus spp. On BCID) in 3 out of 4 bottles   Name of physician (or Provider) Contacted: Chevis Pretty, MD  Current antibiotics:  Cefepime 2g IV q12h Vancomycin IV q24h  Changes to prescribed antibiotics recommended: Discontinue vancomycin Switch cefepime to Ceftriaxone 2g IV q24h  Results for orders placed or performed during the hospital encounter of 04/28/19  Blood Culture ID Panel (Reflexed) (Collected: 05/03/2019  8:55 PM)  Result Value Ref Range   Enterococcus species NOT DETECTED NOT DETECTED   Listeria monocytogenes NOT DETECTED NOT DETECTED   Staphylococcus species NOT DETECTED NOT DETECTED   Staphylococcus aureus (BCID) NOT DETECTED NOT DETECTED   Streptococcus species DETECTED (A) NOT DETECTED   Streptococcus agalactiae NOT DETECTED NOT DETECTED   Streptococcus pneumoniae NOT DETECTED NOT DETECTED   Streptococcus pyogenes NOT DETECTED NOT DETECTED   Acinetobacter baumannii NOT DETECTED NOT DETECTED   Enterobacteriaceae species NOT DETECTED NOT DETECTED   Enterobacter cloacae complex NOT DETECTED NOT DETECTED   Escherichia coli NOT DETECTED NOT DETECTED   Klebsiella oxytoca NOT DETECTED NOT DETECTED   Klebsiella pneumoniae NOT DETECTED NOT DETECTED   Proteus species NOT DETECTED NOT DETECTED   Serratia marcescens NOT DETECTED NOT DETECTED   Haemophilus influenzae NOT DETECTED NOT DETECTED   Neisseria meningitidis NOT DETECTED NOT DETECTED   Pseudomonas aeruginosa NOT DETECTED NOT DETECTED   Candida albicans NOT DETECTED  NOT DETECTED   Candida glabrata NOT DETECTED NOT DETECTED   Candida krusei NOT DETECTED NOT DETECTED   Candida parapsilosis NOT DETECTED NOT DETECTED   Candida tropicalis NOT DETECTED NOT DETECTED    Thank you for allowing pharmacy to be a part of this patient's care.  Lenord Carbo, PharmD PGY1 Pharmacy Resident Phone: [phone]  Please check AMION for all [facility] Pharmacy phone numbers 05/04/2019  12:04 PM

## 2019-05-04 NOTE — Progress Notes (Signed)
Patient's VSS taken showing a temperature of 102.6 orally. Patient very hot and confused while lying in bed. MD notified of the rise in temperature and blood cultures and urine cultures sent to lab. Tylenol given PO for fever spike. Will continue to monitor closely.

## 2019-05-04 NOTE — Progress Notes (Signed)
Pt retyrned to unit, asleep.

## 2019-05-04 NOTE — Progress Notes (Signed)
Pt left unit in bed accompanied for ct.

## 2019-05-04 NOTE — Progress Notes (Addendum)
Subjective: Patient was seen and evaluated at bedside on morning rounds. He was febrile last night. He appears a little bit weak today and looks uncomfortable  due to pain and asks [place] to help him role on his side due to shoulder pain. He complaints of worsening of right shoulder pain, it aggravated with movement. He also reports dizziness when he moves. He was febrile last night and was seen by night team. He denies pain in his chest. No SOB.  No abdominal pain, no nausea or vomiting and no diarrhea.  Objective:  Vital signs in last 24 hours: Vitals:   05/03/19 2348 05/04/19 0000 05/04/19 0343 05/04/19 0400  BP: (!) 169/89  (!) 184/70   Pulse: 86  78   Resp: (!) 28 (!) 29 (!) 26 (!) 26  Temp: 98.6 F (37 C)  98.2 F (36.8 C)   TempSrc: Oral  Oral   SpO2:      Weight:      Height:       Physicalexam: General: He appears uncomfortable and tries to move to his sides alternatively due to rt shoulder pain. EYES: PEERLA, Has bilateral horizontal nistagmus CV:RRR (some ectopy during the exam and when he talks noticed on monitor) Pulmonary exam: No respiratory distress, no crackle, no wheezing. Abdomen: Is soft and nontender to palpation BS are present Neurology exam: Is alert. He moves all of extremities.  Musculoskeletal: Rt shoulder tenderness. ROM is full but painful. No axillary LAD. No swelling, erythema, warmth or lesion at the area. Rt arm, forearm and hands are normal.  Pulses are present. No lower extremity edema. Skin: No erythema, wound or ulcer, no rash  Assessment/Plan:  Principal Problem:   Substance induced mood disorder (HCC) Active Problems:   Coronary artery disease   Hypertension   Tobacco abuse   Alcohol abuse   Diabetes mellitus without complication (HCC)   Cocaine abuse (HCC)   Suicidal ideation   Chest pain   Atrial fibrillation (HCC)  63 year old male with past medical history of CAD status post stent, DM HTN, history of CVA(left side residual  weakness),alcohol and cocaine use, smoking, MDD came to the emergency department today due toworsening of intermittentchest pain.  New fever, leukocytosis: Patient developed fever and significant elevation at WBC. significant leukocytosis of 20, eutr dominanat last night. Repeat CBC this AM: WBC Pending. He has started on empiric Vanc and Cefepime over night. BC nigative pending. Negative so far  No respiratory symptoms, exam and CXR unremarkable No GI symptoms, and abdominal exam is benign.  UA w/o evidence of infection. Skin exam w/u evidence of inflammation, cellulitis... COVID-19 negative   He is a little bit confused today, has nistagmus and reports dizziness. Concerning for CNS event. Was not able to rull out neck stiffness as patient could not cooperate. Doing head CT.   He also has worsening of rt shoulder pain. Has tenderness around his shoulder and ROM is painful. He denies Hx of IV drug use. No known R.F for septic arthritis and has no swelling, erythema or warmth on exam. How ever his pain has been worse since yesterday, in setting of fever and leukocytosis, with no other source of infection sop far, and since external exam may not be very prominent in shoulder septic arthritis, will consider imagine after head CT and r/u CNS etiology.--> CT head unremarkable. I talked top radiologist and he recommended MRI as the first step for evaluation of (probable) shoulder septic arthritis (and no ultrasound or xray)  Part of his symptoms such as hyperthermia and nystagmus can suggest Serotonin syndrome. He does not have colonus and rigidity. Also has leukocytosis that is more suggestive of infections etiology  -Head CT--> No acute intracranial abnormality reported -Monitor VS -Fall precautions -Will consider shoulder imaging (Likely MRI) -F/u CBC result -Tylenol PRN for fever -Continue empiricVancomycine and Cefepime -F/u BC and U/C -Monitor mental status, symptoms such as  rigidity, may adjusting/holding his psych medications if developers evidence of Serotonin syndrome. -Shoulder MRI  ACS rule out/ Chest pain in setting of cocaine use :UDS positive for cocaine. No evidence of ischemia. Stress test with low risk per cardiology. Chest painresolved. No further work up at this point.  -Continue home ASA 80 mg QD and Plavix 75 mg QD -Continue home Lipitor  -BMPdaily  A-fibwith RVR:  Initially started onDiltiazem 30q 6hduring thishospitalization. Then switched to home Coreg. Converted to sinus, on Home Coreg.  -Cardiac monitoring -Continue carvedilol 25 mg twice daily  AKI: Resolved.   Normocytic Anemia: Hbstable. With Ferritin 80. IDA is still possible.  -CBC daily -Monitor CBC outpatient -Continuehome ferrous sulfate -Continue folic acid  DM II:On Metfromin 100 mg BID at home. -ContinueSensitive SSI  -CBG monitoring  Alcohol use:  No withdrawal. -CIWA with Ativan -Thiamin 100 mg IV QD -Continue folic acid  MDD, Hx of suicidal thoughts: Mood is much improved in past 2 days. No suicidal though. Psychiatry visited her in hospital and cleared him for SNF. Continuing home meds. . We will closely monitor his symptoms and may consider adjusting/holding his psych medications if concerns of serotonin syndrome..  -Continue with Amantadin 100 mg BID -Continuehome Abilify 2 mg QD and Vortioxetin 10 mg QD -Close follow-up withpsychiatryout patient. -Appreciate social worker follow-up for SNF placement as well as providing resources for substance abuse treatment -We will closely monitor his symptoms and may consider adjusting/holding his psych medications  Diet:HH carb mod IV fluid:None VTE ppx:Sub Q Hep Code status:Full  Dispo:Anticipate DC in 2-3 days  Chevis PrettyMasoudi, Ossie Beltran, MD 05/04/2019, 6:18 AM Pager: [phone]

## 2019-05-04 NOTE — Progress Notes (Signed)
  Date: 05/04/2019  Patient name: William Gonzalez  Medical record number: [id number]  Date of birth: [birth year]   I have seen and evaluated this patient and I have discussed the plan of care with the house staff. Please see their note for complete details. I concur with their findings with the following additions/corrections:   Febrile overnight, RN noted he was more confused.  Continues to complain of right shoulder pain.  On exam, no increased warmth, redness, or tenderness compared to the left shoulder.  ROM is limited by pain to approximately 90 degrees of flexion and abduction.  Additional finding on exam is new nystagmus beating to the left.  Blood cultures drawn last night are now growing gram-positive cocci in chains, BC ID consistent with non-pneumococcal Streptococcus.  Unfortunately, appears to have developed streptococcal bacteremia of unknown source.  We will evaluate his right shoulder further with MRI per radiology recommendations.  We will also consult ID for additional recommendations.  CT head did not reveal any acute abnormalities, but development of new nystagmus in the context of bacteremia is concerning for possible septic embolic stroke.  Will obtain brain MRI as well.  [name], M.D., Ph.D. 05/04/2019, 11:32 AM

## 2019-05-04 NOTE — Care Management Important Message (Signed)
Important Message  Patient Details  Name: William Gonzalez MRN: [id number] Date of Birth: Nov 10, 1956   Medicare Important Message Given:  Yes    Mardene Sayer 05/04/2019, 3:57 PM

## 2019-05-04 NOTE — Progress Notes (Signed)
Pharmacy Antibiotic Note  William Gonzalez is a 63 y.o. male admitted on 04/28/2019 with atypical chest pain.  Pharmacy has been consulted for Vancomycin/Cefepime dosing. Pt has been here since 5/5. Now with fever up to 102.8. WBC is 20 (was 13.5 the day before). Renal function OK.   Plan: Vancomycin 1250 mg IV q24h >>Estimated AUC: 485 Cefepime 2g IV q24h Trend WBC, temp, renal function  F/U infectious work-up Drug levels as indicated F/U repeat COVID testing   Height: 5\' 5"  (165.1 cm) Weight: 160 lb (72.6 kg) IBW/kg (Calculated) : 61.5  Temp (24hrs), Avg:99.2 F (37.3 C), Min:98.1 F (36.7 C), Max:102.8 F (39.3 C)  Recent Labs  Lab 04/29/19 0240 04/30/19 0603 05/01/19 0156 05/02/19 0224 05/03/19 0621 05/03/19 2057  WBC 11.7* 15.6* 15.6* 13.5*  --  20.0*  CREATININE 1.30* 0.90 1.02 0.98 1.19  --     Estimated Creatinine Clearance: 53.8 mL/min (by C-G formula based on SCr of 1.19 mg/dL).    Allergies  Allergen Reactions  . Iodine Anaphylaxis and Swelling    Swells all over     Abran Duke 05/04/2019 2:04 AM

## 2019-05-04 NOTE — Progress Notes (Signed)
Physical Therapy Treatment Patient Details Name: William Gonzalez MRN: [id number] DOB: 1956-10-24 Today's Date: 05/04/2019    History of Present Illness 63 y.o. male with history of CAD status post stenting of his RCA 03/24/2012 at [facility].  His other problems include continued tobacco abuse, polysubstance abuse including cocaine.  Admitted with chest pain.  He did have altered mental status.  His enzymes were negative and his EKG showed no acute changes.    PT Comments    Pt admitted with above diagnosis. Pt currently with functional limitations due to balance and endurance deficits. Pt was able to pivot to chair with mod assist as he was lethargic today. Was unable to progress ambulation due to unsteady on feet.  Will continue to follow acutely.   Pt will benefit from skilled PT to increase their independence and safety with mobility to allow discharge to the venue listed below.     Follow Up Recommendations  SNF;Supervision/Assistance - 24 hour     Equipment Recommendations  None recommended by PT    Recommendations for Other Services       Precautions / Restrictions Precautions Precautions: Fall Restrictions Weight Bearing Restrictions: No    Mobility  Bed Mobility Overal bed mobility: Needs Assistance Bed Mobility: Rolling;Sidelying to Sit Rolling: Supervision Sidelying to sit: Mod assist       General bed mobility comments: Pt attempted to sit edge of bed without assistance and he was unable.  he required assistance to advance LEs to edge of bed and elevate trunk into sitting. Once in seated position he was able to scoot to edge of bed to prepare for transfer into standing.    Transfers Overall transfer level: Needs assistance Equipment used: 4-wheeled walker Transfers: Sit to/from Stand Sit to Stand: Min assist;Mod assist;+2 safety/equipment Stand pivot transfers: Min assist;Mod assist;+2 physical assistance       General transfer comment: Pt required cues  pre  transfer to lock brakes before standing/ sitting and to unlock brakes once in standing.  Pt required mod assistance to boost into standing ? due to lethargy.  Condom cath pulled loose and pt urinated on floor.  Cleaned pt with mod assist anc changed all linen.  Pt stood to be cleaned.  Very unsteady on feet.    Ambulation/Gait             General Gait Details: Pt too lethargic to walk today.  was able to get into chair but pt too unsteady to progress ambulation today.    Stairs             Wheelchair Mobility    Modified Rankin (Stroke Patients Only)       Balance Overall balance assessment: Needs assistance Sitting-balance support: No upper extremity supported;Feet supported Sitting balance-Leahy Scale: Fair     Standing balance support: Bilateral upper extremity supported;During functional activity Standing balance-Leahy Scale: Poor Standing balance comment: relies on UE support                            Cognition Arousal/Alertness: Awake/alert Behavior During Therapy: WFL for tasks assessed/performed Overall Cognitive Status: Impaired/Different from baseline Area of Impairment: Following commands;Safety/judgement;Problem solving                       Following Commands: Follows one step commands with increased time Safety/Judgement: Decreased awareness of safety;Decreased awareness of deficits   Problem Solving: Slow processing;Decreased initiation;Difficulty sequencing;Requires verbal cues;Requires tactile  cues General Comments: Pt not following commands as well today and slightly lethargic at times.       Exercises General Exercises - Lower Extremity Long Arc Quad: AROM;Both;10 reps;Seated Hip Flexion/Marching: AROM;Both;10 reps;Seated    General Comments        Pertinent Vitals/Pain Pain Assessment: Faces Faces Pain Scale: Hurts even more Pain Location: B feet and back Pain Descriptors / Indicators:  Discomfort;Grimacing;Guarding Pain Intervention(s): Limited activity within patient's tolerance;Monitored during session;Repositioned    Home Living                      Prior Function            PT Goals (current goals can now be found in the care plan section) Acute Rehab PT Goals Patient Stated Goal: to go to rehab at [place] Progress towards PT goals: Progressing toward goals    Frequency    Min 2X/week      PT Plan Current plan remains appropriate    Co-evaluation              AM-PAC PT "6 Clicks" Mobility   Outcome Measure  Help needed turning from your back to your side while in a flat bed without using bedrails?: None Help needed moving from lying on your back to sitting on the side of a flat bed without using bedrails?: None Help needed moving to and from a bed to a chair (including a wheelchair)?: A Lot Help needed standing up from a chair using your arms (e.g., wheelchair or bedside chair)?: A Lot Help needed to walk in hospital room?: A Lot Help needed climbing 3-5 steps with a railing? : A Lot 6 Click Score: 16    End of Session Equipment Utilized During Treatment: Gait belt Activity Tolerance: Patient limited by fatigue Patient left: in chair;with call bell/phone within reach;with chair alarm set Nurse Communication: Mobility status;Need for lift equipment PT Visit Diagnosis: Unsteadiness on feet (R26.81);Muscle weakness (generalized) (M62.81)     Time: 1109-1140 PT Time Calculation (min) (ACUTE ONLY): 31 min  Charges:  $Therapeutic Exercise: 8-22 mins $Therapeutic Activity: 8-22 mins                     [name],PT Acute Rehabilitation Services Pager:  [phone]  Office:  [phone]     [name] 05/04/2019, 3:47 PM

## 2019-05-04 NOTE — Progress Notes (Addendum)
Was paged by nurse stating that the patient has been confused about the reason for his hospitalization, but was alert and oriented. She also noted a fever of 102.8 and a subsequent elevated temperature of 100.4.   Went to examine patient at bedside. The patient was asleep, but once he awoke he was alert and oriented x3. He stated that he knew he was in the hospital due to "him doing wrong things", he was suicidal and alcohol problems.   He stated he had some chest pain that has been constant for the past few days that is relieved with acetaminophen. He denied any dyspnea, dysuria, burning on urination.   Physical Exam  Constitutional: He is oriented to person, place, and time. He appears well-developed and well-nourished. No distress.  HENT:  Head: Normocephalic and atraumatic.  Eyes: Conjunctivae are normal.  Cardiovascular: Normal rate, regular rhythm and normal heart sounds.  Respiratory: Effort normal and breath sounds normal. No respiratory distress. He has no wheezes. He exhibits no tenderness.  GI: Soft. Bowel sounds are normal. He exhibits no distension. There is no abdominal tenderness.  Musculoskeletal:        General: No edema.     Comments: Weak and unable to sit up without assistance  Neurological: He is alert and oriented to person, place, and time.  Skin: He is not diaphoretic.  Psychiatric: He has a normal mood and affect. His behavior is normal. Judgment and thought content normal.   Assessment and plan  Will do an infectious workup given patient's fever. Ordered cbc, blood cultures, ua, urine culture, covid19.   -CBC significant for leukocytosis of 20 with 17.7 abs neutro-ordered chest xray  -ua without nitrities or leukocytes -asked nurse to give acetaminophen to help with chest pain -started vancomycin and cefepime empirically  Lorenso Courier, MD Internal Medicine PGY2 Pager:[phone] 05/04/2019, 12:58 AM

## 2019-05-04 NOTE — TOC Progression Note (Signed)
Transition of Care [facility]) - Progression Note    Patient Details  Name: William Gonzalez MRN: [id number] Date of Birth: [birth year]  Transition of Care [facility]) CM/SW Contact  Colleen Can RN, BSN, NCM-BC, [place] [phone] Phone Number: 05/04/2019, 3:04 PM  Clinical Narrative:    CM is following for dispositional needs. Received an incoming call from April, [place] transfer coordinator, requesting updates on the patients POC. SW is following for SNF placement with the referrals faxed out; awaiting potential bed offers. CM team will continue to follow.    Expected Discharge Plan: Skilled Nursing Facility Barriers to Discharge: Active Substance Use - Placement, SNF Pending bed offer  Expected Discharge Plan and Services Expected Discharge Plan: Skilled Nursing Facility   Discharge Planning Services: CM Consult   Living arrangements for the past 2 months: Hotel/Motel   Social Determinants of Health (SDOH) Interventions    Readmission Risk Interventions Readmission Risk Prevention Plan 04/29/2019 04/29/2019  Transportation Screening - Complete  Medication Review Oceanographer) Complete Complete  PCP or Specialist appointment within 3-5 days of discharge (No Data) -  HRI or Home Care Consult Complete -  SW Recovery Care/Counseling Consult Complete -  Palliative Care Screening Not Applicable -  Skilled Nursing Facility Not Complete -  Some recent data might be hidden

## 2019-05-04 NOTE — Consult Note (Signed)
[facility]    Date of Admission:  04/28/2019     Total days of antibiotics 1  Day 1 ceftriaxone               Reason for Consult: Streptococcal bacteremia     Referring Provider: Amanda Cockayne team  Primary Care Provider: Cain Saupe, MD    Assessment: William Gonzalez is a 63 y.o. male admitted 6 days ago with atypical chest pain. Cardiac work up negative for acute ischemic event, but found to be in atrial fibrillation. On hospital day 5 he spiked a fever up to 102.8 degrees and increased white blood cell count to 23.2K. All four blood cultures growing gram positive cocci in chains with no speciation on BCID yet but showing streptococcus.   Unclear as to his source of bacteremia. ?related to odontogenic source being he was scheduled for extractions recently and has poor dentition on exam today. He does report dental pain with chewing. Will repeat his blood cultures in AM to see how he is clearing. Agree with Ceftriaxone therapy for now. Although his TTE was unrevealing for vegetation if we cannot find alternative source may need to consider TEE. It is hard to get a full history out of him presently as he falls asleep during conversation so uncertain as to health leading up to hospitalization/constitutional symptoms.   Would hold off on LP for now w/o any nuchal rigidity - ams/confusion may be explained by the fever and seems to have improved today, although he is still lethargic.   Plan: 1. Continue ceftriaxone for now 2. Repeat blood cultures 3. Hold on PICC line or any vascular access for now  Principal Problem:   Substance induced mood disorder (HCC) Active Problems:   Coronary artery disease   Hypertension   Tobacco abuse   Alcohol abuse   Diabetes mellitus without complication (HCC)   Cocaine abuse (HCC)   Suicidal ideation   Chest pain   Atrial fibrillation (HCC)   . amantadine  100 mg Oral BID  . ARIPiprazole  2 mg Oral Daily  .  atorvastatin  40 mg Oral q1800  . carvedilol  25 mg Oral BID WC  . folic acid  1 mg Oral Daily  . heparin  5,000 Units Subcutaneous Q8H  . insulin aspart  0-5 Units Subcutaneous QHS  . insulin aspart  0-9 Units Subcutaneous TID WC  . pantoprazole  40 mg Oral Daily  . potassium & sodium phosphates  1 packet Oral BID  . ramelteon  8 mg Oral QHS  . thiamine  100 mg Oral Daily  . vortioxetine HBr  10 mg Oral Daily    HPI: William Gonzalez is a 63 y.o. male with pmhx significant for CAD s/p stent placement, DM, HTN, CVA (L sided weakness, remote), alcohol and cocaine use, ongoing smoking, MDD.   He sought care in the ER on 5/05 to evaluate chest pain that had been present for 2-3 weeks. Cardiac work up revealed negative/flat troponins, AKI with Cr 2.4 >> 1.3, EKG Afib with RVR. CXR negative. Myoscan obtained revealing fixed inferior wall defect, no significant reversible ischemia with stress, global hypokinesis, LVEF 40%.   Overnight noted to have new fever and worsening leukocytosis (13.5 >> 20K) in the setting of nystagmus and confusion. Cultures were drawn and started empirically on vancomycin + cefepime. He has had some confusion and increased R shoulder pain. Underwent Head and R Shoulder  MRI today for evaluation with reports pending. Head CT unrevealing for acute process.   Blood cultures 4/4 bottles growing gram positive cocci in chains with early BCID revealing streptococcus species. Transthoracic echo was obtained revealing moderate calcification of AoV with moderate AI and mild stenosis; MV with trivial regurgitation and mild calcification.   He is awakens intermittently during history/exam. He is able to answer all my questions about where he is, why he is in the hospital, etc. He tells me he is due to have oral surgery for dental extractions involving all of his teeth, but this has been postponed for the foreseeable future due to COVID-19.  He tells me he does have dental pain and  difficulty chewing and eating food.  This is not worsened since prior to admission but remains difficult for him.  He does have some neck pain that he does not really have much detail about.  He denies any shortness of breath, cough, chest pain, abdominal pain, diarrhea.  He has no headaches no photosensitivity.  He tells me he is having a lot of pain all over.  Chart review reveals that he had a difficult time staying still for his exams done in radiology and was unable to obtain IV contrast.   Review of Systems: Review of Systems  Constitutional: Positive for fever. Negative for malaise/fatigue and weight loss.  HENT: Negative for sore throat.   Eyes: Negative for blurred vision and photophobia.  Respiratory: Negative for cough and shortness of breath.   Cardiovascular: Negative for chest pain, orthopnea and leg swelling.  Gastrointestinal: Negative for abdominal pain, diarrhea and vomiting.  Genitourinary: Negative for dysuria.  Musculoskeletal: Positive for neck pain. Negative for back pain and myalgias.  Skin: Negative for rash.  Neurological: Positive for weakness (L sided, baseline ). Negative for speech change and headaches.    Past Medical History:  Diagnosis Date  . Alcohol abuse   . Coronary artery disease   . Diabetes mellitus   . Hypertension   . Stented coronary artery   . Stroke (HCC)   . Tobacco abuse     Social History   Tobacco Use  . Smoking status: Current Every Day Smoker    Packs/day: 0.30    Types: Cigarettes  . Smokeless tobacco: Never Used  Substance Use Topics  . Alcohol use: Yes    Comment: "as much as i can get"  . Drug use: Yes    Types: Cocaine    Family History  Problem Relation Age of Onset  . Hypertension Mother   . Diabetes Mother   . Hypertension Father   . Diabetes Father   . Hypertension Sister   . Hypertension Brother   . Diabetes Sister   . Diabetes Brother    Allergies  Allergen Reactions  . Iodine Anaphylaxis and Swelling     Swells all over    OBJECTIVE: Blood pressure (!) 142/60, pulse 76, temperature (!) 100.5 F (38.1 C), temperature source Axillary, resp. rate (!) 24, height 5\' 5"  (1.651 m), weight 72.6 kg, SpO2 96 %.  Physical Exam Vitals signs reviewed.  Constitutional:      Comments: Lethargic.  Resting in bed eyes closed.  Opens them briefly and interacts verbally then falls back asleep.  HENT:     Head:     Comments: Poor dentition noted. No swelling of the jaw observed. No head/neck lymphadenopathy appreciated.  Eyes:     Pupils: Pupils are equal, round, and reactive to light.  Neck:  Comments: He has pain with flexion and is unable to hold his head off the pillow on his own accord.  He had no pain with lateral rotation to either side.  Able to hyperextend with minimal pain. Cardiovascular:     Rate and Rhythm: Normal rate and regular rhythm.     Heart sounds: Murmur present. Systolic murmur present with a grade of 2/6. No friction rub.  Pulmonary:     Effort: Pulmonary effort is normal. No respiratory distress.     Comments: Coarse upper airway noise.  Bases normal limits. Abdominal:     General: There is no abdominal bruit.     Palpations: Abdomen is soft.  Musculoskeletal: Normal range of motion.  Skin:    Capillary Refill: Capillary refill takes less than 2 seconds.  Neurological:     General: No focal deficit present.     Mental Status: He is oriented to person, place, and time.     Lab Results Lab Results  Component Value Date   WBC 23.2 (H) 05/04/2019   HGB 10.3 (L) 05/04/2019   HCT 29.2 (L) 05/04/2019   MCV 95.1 05/04/2019   PLT 317 05/04/2019    Lab Results  Component Value Date   CREATININE 1.16 05/04/2019   BUN 16 05/04/2019   NA 134 (L) 05/04/2019   K 4.1 05/04/2019   CL 101 05/04/2019   CO2 21 (L) 05/04/2019    Lab Results  Component Value Date   ALT 12 05/02/2019   AST 11 (L) 05/02/2019   ALKPHOS 109 05/02/2019   BILITOT 0.4 05/02/2019      Microbiology: Recent Results (from the past 240 hour(s))  SARS Coronavirus 2 (CEPHEID - Performed in [facility] hospital lab), Hosp Order     Status: None   Collection Time: 04/28/19  1:01 PM  Result Value Ref Range Status   SARS Coronavirus 2 NEGATIVE NEGATIVE Final    Comment: (NOTE) SARS CoV 2 target nucleic acids are NOT DETECTED. The SARS CoV 2 RNA is generally detectable in upper and lower respiratory specimens during the acute phase of infection. The lowest concentration of SARS CoV 2 viral copies this assay can detect is 250 copies per mL. A negative result does not preclude SARS CoV 2 infection and should not be used as the sole basis for treatment or other patient management decisions. A negative result may occur with improper specimen collection and handling, submission  of specimen other than nasopharyngeal swab, presence of viral mutation(s) within the areas targeted by this assay, and inadequate number of viral copies (less than 250 copies per mL). A negative result must be combined with clinical observations, patient history,  and epidemiological information. The expected result is Negative. Fact Sheet for Patients:  https PumpkinSearch.com.ee media 254270 download Fact Sheet for Healthcare Providers:  https PumpkinSearch.com.ee media 709-812-5257 dow nload This test is not yet approved or cleared by the [place] FDA and  has been authorized for detection and/or diagnosis of SARS CoV 2 by FDA under an Emergency Use Authorization (EUA).  This EUA will remain in effect (meaning this test can be used) for the duration of  the COVID19 declaration under Section 564(b)(1) of the Act, 21 U.S.C.  section 360bbb-3(b)(1), unless the authorization is terminated or revoked sooner. Performed at [facility], [address] [place], [place] [address]   MRSA PCR Screening     Status: None   Collection Time: 04/28/19  3:05 PM  Result Value Ref Range  Status   MRSA by PCR NEGATIVE NEGATIVE  Final    Comment:        The GeneXpert MRSA Assay (FDA approved for NASAL specimens only), is one component of a comprehensive MRSA colonization surveillance program. It is not intended to diagnose MRSA infection nor to guide or monitor treatment for MRSA infections. Performed at [facility], [address] [place], [place] [address]   Culture, blood (routine x 2)     Status: None (Preliminary result)   Collection Time: 05/03/19  8:55 PM  Result Value Ref Range Status   Specimen Description BLOOD RIGHT ARM  Final   Special Requests   Final    BOTTLES DRAWN AEROBIC AND ANAEROBIC Blood Culture adequate volume   Culture  Setup Time   Final    GRAM POSITIVE COCCI IN CHAINS IN BOTH AEROBIC AND ANAEROBIC BOTTLES Organism ID to follow CRITICAL RESULT CALLED TO, READ BACK BY AND VERIFIED WITH: Orie Fisherman PharmD 12:00 05/04/19 ([email or web address])    Culture   Final    NO GROWTH < 12 HOURS Performed at [facility], [address], [place], [place] [address]    Report Status PENDING  Incomplete  Blood Culture ID Panel (Reflexed)     Status: Abnormal   Collection Time: 05/03/19  8:55 PM  Result Value Ref Range Status   Enterococcus species NOT DETECTED NOT DETECTED Final   Listeria monocytogenes NOT DETECTED NOT DETECTED Final   Staphylococcus species NOT DETECTED NOT DETECTED Final   Staphylococcus aureus (BCID) NOT DETECTED NOT DETECTED Final   Streptococcus species DETECTED (A) NOT DETECTED Final    Comment: Not Enterococcus species, Streptococcus agalactiae, Streptococcus pyogenes, or Streptococcus pneumoniae. CRITICAL RESULT CALLED TO, READ BACK BY AND VERIFIED WITH: Orie Fisherman PharmD 12:00 05/04/19 ([email or web address])    Streptococcus agalactiae NOT DETECTED NOT DETECTED Final   Streptococcus pneumoniae NOT DETECTED NOT DETECTED Final   Streptococcus pyogenes NOT DETECTED NOT DETECTED Final   Acinetobacter baumannii NOT DETECTED NOT DETECTED Final   Enterobacteriaceae species NOT  DETECTED NOT DETECTED Final   Enterobacter cloacae complex NOT DETECTED NOT DETECTED Final   Escherichia coli NOT DETECTED NOT DETECTED Final   Klebsiella oxytoca NOT DETECTED NOT DETECTED Final   Klebsiella pneumoniae NOT DETECTED NOT DETECTED Final   Proteus species NOT DETECTED NOT DETECTED Final   Serratia marcescens NOT DETECTED NOT DETECTED Final   Haemophilus influenzae NOT DETECTED NOT DETECTED Final   Neisseria meningitidis NOT DETECTED NOT DETECTED Final   Pseudomonas aeruginosa NOT DETECTED NOT DETECTED Final   Candida albicans NOT DETECTED NOT DETECTED Final   Candida glabrata NOT DETECTED NOT DETECTED Final   Candida krusei NOT DETECTED NOT DETECTED Final   Candida parapsilosis NOT DETECTED NOT DETECTED Final   Candida tropicalis NOT DETECTED NOT DETECTED Final    Comment: Performed at [facility], [address] [place], [place] [address]  Culture, blood (routine x 2)     Status: None (Preliminary result)   Collection Time: 05/03/19  9:00 PM  Result Value Ref Range Status   Specimen Description BLOOD RIGHT HAND  Final   Special Requests   Final    BOTTLES DRAWN AEROBIC AND ANAEROBIC Blood Culture adequate volume   Culture  Setup Time   Final    GRAM POSITIVE COCCI IN CHAINS IN BOTH AEROBIC AND ANAEROBIC BOTTLES CRITICAL VALUE NOTED.  VALUE IS CONSISTENT WITH PREVIOUSLY REPORTED AND CALLED VALUE.    Culture   Final  NO GROWTH < 12 HOURS Performed at [facility], [address], [place], [place] [address]    Report Status PENDING  Incomplete  SARS Coronavirus 2 (CEPHEID - Performed in [facility] hospital lab), Hosp Order     Status: None   Collection Time: 05/04/19  3:29 AM  Result Value Ref Range Status   SARS Coronavirus 2 NEGATIVE NEGATIVE Final    Comment: (NOTE) If result is NEGATIVE SARS-CoV-2 target nucleic acids are NOT DETECTED. The SARS-CoV-2 RNA is generally detectable in upper and lower  respiratory specimens during the acute phase of  infection. The lowest  concentration of SARS-CoV-2 viral copies this assay can detect is 250  copies / mL. A negative result does not preclude SARS-CoV-2 infection  and should not be used as the sole basis for treatment or other  patient management decisions.  A negative result may occur with  improper specimen collection / handling, submission of specimen other  than nasopharyngeal swab, presence of viral mutation(s) within the  areas targeted by this assay, and inadequate number of viral copies  (<250 copies / mL). A negative result must be combined with clinical  observations, patient history, and epidemiological information. If result is POSITIVE SARS-CoV-2 target nucleic acids are DETECTED. The SARS-CoV-2 RNA is generally detectable in upper and lower  respiratory specimens dur ing the acute phase of infection.  Positive  results are indicative of active infection with SARS-CoV-2.  Clinical  correlation with patient history and other diagnostic information is  necessary to determine patient infection status.  Positive results do  not rule out bacterial infection or co-infection with other viruses. If result is PRESUMPTIVE POSTIVE SARS-CoV-2 nucleic acids MAY BE PRESENT.   A presumptive positive result was obtained on the submitted specimen  and confirmed on repeat testing.  While 2019 novel coronavirus  (SARS-CoV-2) nucleic acids may be present in the submitted sample  additional confirmatory testing may be necessary for epidemiological  and / or clinical management purposes  to differentiate between  SARS-CoV-2 and other Sarbecovirus currently known to infect humans.  If clinically indicated additional testing with an alternate test  methodology [phone]) is advised. The SARS-CoV-2 RNA is generally  detectable in upper and lower respiratory sp ecimens during the acute  phase of infection. The expected result is Negative. Fact Sheet for Patients:   BoilerBrush.com.cy Fact Sheet for Healthcare Providers: https://pope.com/ This test is not yet approved or cleared by the [place] FDA and has been authorized for detection and/or diagnosis of SARS-CoV-2 by FDA under an Emergency Use Authorization (EUA).  This EUA will remain in effect (meaning this test can be used) for the duration of the COVID-19 declaration under Section 564(b)(1) of the Act, 21 U.S.C. section 360bbb-3(b)(1), unless the authorization is terminated or revoked sooner. Performed at [facility], [address], [place], [place] [address]     Rexene Alberts, MSN, NP-C [facility] for Infectious Disease [facility] Cell: [phone] Pager: [phone]  05/04/2019 2:18 PM

## 2019-05-04 NOTE — Progress Notes (Signed)
Went in to inject pt with contrast, pt did not want the contrast and refused any further scanning due to being in too much pain and the loud noises, told him his nurse was going to bring him more pain meds and he still refused. Did not get contrast for either his MRI rt shoulder or MRI brain. RN aware, and said she would let ordering provider know and to send him back to his room

## 2019-05-05 DIAGNOSIS — R4182 Altered mental status, unspecified: Secondary | ICD-10-CM

## 2019-05-05 DIAGNOSIS — S46001A Unspecified injury of muscle(s) and tendon(s) of the rotator cuff of right shoulder, initial encounter: Secondary | ICD-10-CM

## 2019-05-05 DIAGNOSIS — D72829 Elevated white blood cell count, unspecified: Secondary | ICD-10-CM

## 2019-05-05 DIAGNOSIS — M75101 Unspecified rotator cuff tear or rupture of right shoulder, not specified as traumatic: Secondary | ICD-10-CM

## 2019-05-05 DIAGNOSIS — X58XXXA Exposure to other specified factors, initial encounter: Secondary | ICD-10-CM

## 2019-05-05 LAB — CBC WITH DIFFERENTIAL/PLATELET
Abs Immature Granulocytes: 0.16 10*3/uL — ABNORMAL HIGH (ref 0.00–0.07)
Basophils Absolute: 0 10*3/uL (ref 0.0–0.1)
Basophils Relative: 0 %
Eosinophils Absolute: 0 10*3/uL (ref 0.0–0.5)
Eosinophils Relative: 0 %
HCT: 28.8 % — ABNORMAL LOW (ref 39.0–52.0)
Hemoglobin: 9.8 g/dL — ABNORMAL LOW (ref 13.0–17.0)
Immature Granulocytes: 1 %
Lymphocytes Relative: 4 %
Lymphs Abs: 0.8 10*3/uL (ref 0.7–4.0)
MCH: 32.5 pg (ref 26.0–34.0)
MCHC: 34 g/dL (ref 30.0–36.0)
MCV: 95.4 fL (ref 80.0–100.0)
Monocytes Absolute: 1.5 10*3/uL — ABNORMAL HIGH (ref 0.1–1.0)
Monocytes Relative: 7 %
Neutro Abs: 17.9 10*3/uL — ABNORMAL HIGH (ref 1.7–7.7)
Neutrophils Relative %: 88 %
Platelets: 314 10*3/uL (ref 150–400)
RBC: 3.02 MIL/uL — ABNORMAL LOW (ref 4.22–5.81)
RDW: 11.9 % (ref 11.5–15.5)
WBC: 20.4 10*3/uL — ABNORMAL HIGH (ref 4.0–10.5)
nRBC: 0 % (ref 0.0–0.2)

## 2019-05-05 LAB — BASIC METABOLIC PANEL
Anion gap: 15 (ref 5–15)
BUN: 16 mg/dL (ref 8–23)
CO2: 23 mmol/L (ref 22–32)
Calcium: 8.7 mg/dL — ABNORMAL LOW (ref 8.9–10.3)
Chloride: 97 mmol/L — ABNORMAL LOW (ref 98–111)
Creatinine, Ser: 1.09 mg/dL (ref 0.61–1.24)
GFR calc Af Amer: >60 mL/min (ref >60)
GFR calc non Af Amer: >60 mL/min (ref >60)
Glucose, Bld: 203 mg/dL — ABNORMAL HIGH (ref 70–99)
Potassium: 3.8 mmol/L (ref 3.5–5.1)
Sodium: 135 mmol/L (ref 135–145)

## 2019-05-05 LAB — GLUCOSE, CAPILLARY
Glucose-Capillary: 206 mg/dL — ABNORMAL HIGH (ref 70–99)
Glucose-Capillary: 261 mg/dL — ABNORMAL HIGH (ref 70–99)
Glucose-Capillary: 193 mg/dL — ABNORMAL HIGH (ref 70–99)
Glucose-Capillary: 139 mg/dL — ABNORMAL HIGH (ref 70–99)

## 2019-05-05 MED ORDER — KETOROLAC TROMETHAMINE 30 MG/ML IJ SOLN
30.0000 mg | Freq: Once | INTRAMUSCULAR | Status: AC
  Administered 2019-05-05: 09:00:00 30 mg via INTRAVENOUS
  Filled 2019-05-05: qty 1

## 2019-05-05 MED ORDER — KETOROLAC TROMETHAMINE 30 MG/ML IJ SOLN
30.0000 mg | Freq: Three times a day (TID) | INTRAMUSCULAR | Status: DC | PRN
  Administered 2019-05-05 – 2019-05-07 (×6): 30 mg via INTRAVENOUS
  Filled 2019-05-05 (×5): qty 1

## 2019-05-05 MED ORDER — ALBUTEROL SULFATE (2.5 MG/3ML) 0.083% IN NEBU: 2.5000 mg | INHALATION_SOLUTION | Freq: Four times a day (QID) | RESPIRATORY_TRACT | Status: DC | PRN

## 2019-05-05 NOTE — Progress Notes (Addendum)
Subjective: Patient was seen and evaluated at bedside on morning rounds. He is uncomfortable due to shoulder pain but his dizziness resolved. He answers our questions clearly.  He mentions that he was supposed to get oral surgery before but it delayed due to pandemic.  He denies pain in his arm .No other complaint. No acute event overnight.  Objective:  Vital signs in last 24 hours: Vitals:   05/04/19 2300 05/05/19 0021 05/05/19 0247 05/05/19 0404  BP:  (!) 175/64  135/63  Pulse:      Resp: 19 20 20 14   Temp:  98.7 F (37.1 C)  (!) 97.4 F (36.3 C)  TempSrc:  Axillary  Oral  SpO2:      Weight:      Height:       Physicalexam: General: He appears uncomfortable and tries to move to his sides alternatively due to rt shoulder pain. HEENT: Dental caries, but no visible soft tissue infection. EYES: PEERLA, Very mild horizontal Nystagmus. (significantly improved today)  CV:RRR, nl S1S2. Pulmonary exam: No respiratory distress, no crackle, faint wheezing. Abdomen: Is soft and nontender to palpation BS are present Neurology exam: Is alert. He moves all of extremities.  Musculoskeletal:Rt shoulder is painful with movement. Pulses are present. No lower extremity edema. Skin: No erythema, wound or ulcer, no rash Neurlogy exam: He is alert and oriented x 3. No new focal neurologic deficit  Assessment/Plan:  Principal Problem:   Substance induced mood disorder (HCC) Active Problems:   Coronary artery disease   Hypertension   Tobacco abuse   Alcohol abuse   Diabetes mellitus without complication (HCC)   Cocaine abuse (HCC)   Suicidal ideation   Chest pain   Atrial fibrillation (HCC)  63 year old male with past medical history of CAD status post stent, DM HTN, history of CVA(left side residual weakness),alcohol and cocaine use, smoking, MDD came to the emergency department today due toworsening of intermittentchest pain.  Gr + bacteriemia with no certain known source:  May be 2/2 teeth infection.  Is currently on Ceftriaxone. (started 05/04/2019) ID consulted and agreed with continuing Ceftriaxone. Recommended no IV line or PICC line insertion. And repeat BC.  Head CT--> No acute intracranial abnormality reported Brain MRI unremarkable as much as completed Rt Shoulder MRI with chronic rotator cuff injury and ** but no evidence of septic arthritis.  Patient looks better today no more dizziness. is alert and oriented today. Nystagmus significantly improved.   -Continue Ceftriaxone for now -F/u repeat CBC result -ID consulted and is on board. Appreciate recommendations ->No Echo at this point unless BC remains possitive ->[facility]  Rt rotator cuff injury: Patient complains of sever pain, that started during this hospitalization   -Adding Toradol for shoulder pain -ContinueTylenol PRN for fever -May ask orthopedic for recommendations--> I talked to on call orthopedic PA about our patient.  recommended physical therapy and then can refer to ortho/sport medicine outpatient to evaluates if he may take benefit of steriod injection.  ACS rule out/ Chest pain in setting of cocaine use :UDS positive for cocaine. No evidence of ischemia. Stress test with low risk per cardiology. Chest painresolved. No further work up at this point.  -Continue home ASA 80 mg QD and Plavix 75 mg QD -Continue home Lipitor  -BMPdaily  A-fibwith RVR:  Remained sinus. Continue home Coreg.  -Cardiac monitoring -Continue carvedilol 25 mg twice daily  AKI: Resolved.   Normocytic Anemia: Hbstable. With Ferritin 80. IDA is still possible.  -  CBC daily -Monitor CBC outpatient -Continuehome ferrous sulfate -Continue folic acid  DM II:On Metfromin 100 mg BID at home. -ContinueSensitive SSI  -CBG monitoring  Alcohol use:  No withdrawal. -CIWA with Ativan -Thiamin 100 mg IV QD -Continue folic acid  MDD, Hx of suicidal thoughts: Stable.  Psychiatry visited him in hospital and recommended resuming home meds.  -Continue withAmantadin 100 mg BID -Continuehome Abilify 2 mg QD and Vortioxetin 10 mg QD -Close follow-up withpsychiatryout patient. -Appreciate social worker follow-up for SNF placement as well as providing resources for substance abuse treatment   Diet:HH carb mod IV fluid:None VTE ppx:Sub Q Hep Code status:Full    Dispo: Anticipated discharge depend on antibiotic duration plan and SNF placement  William Pretty, MD 05/05/2019, 8:39 AM Pager: [phone]

## 2019-05-05 NOTE — Progress Notes (Signed)
Inpatient Diabetes Program Recommendations  AACE/ADA: New Consensus Statement on Inpatient Glycemic Control (2015)  Target Ranges:  Prepandial:   less than 140 mg/dL      Peak postprandial:   less than 180 mg/dL (1-2 hours)      Critically ill patients:  140 - 180 mg/dL   Lab Results  Component Value Date   GLUCAP 206 (H) 05/05/2019   HGBA1C 7.8 (H) 04/12/2019    Review of Glycemic Control Results for William Gonzalez, William Gonzalez (MRN [id number]) as of 05/05/2019 09:41  Ref. Range 05/04/2019 06:12 05/04/2019 11:11 05/04/2019 16:45 05/04/2019 21:26 05/05/2019 06:50  Glucose-Capillary Latest Ref Range: 70 - 99 mg/dL 009 (H) 381 (H) 829 (H) 170 (H) 206 (H)    Inpatient Diabetes Program Recommendations:   -Levemir 10 units daily (-Levemir 10 units (0.15 units/kg x 72.6 kg =10.9 units).) if patient agreeable to take while in the hospital if fasting CBGs remain elevated.  Thank you, Billy Fischer. Hannia Matchett, RN, MSN, CDE  Diabetes Coordinator Inpatient Glycemic Control Team Team Pager [phone] (8am-5pm) 05/05/2019 9:43 AM

## 2019-05-05 NOTE — Progress Notes (Signed)
  Date: 05/05/2019  Patient name: William Gonzalez  Medical record number: [id number]  Date of birth: 08-11-1956   I have seen and evaluated this patient and I have discussed the plan of care with the house staff. Please see their note for complete details. I concur with their findings with the following additions/corrections:   63 year old man originally admitted with acute chest pain likely related to cocaine use and A. fib with RVR.  He has since converted to sinus rhythm and chest pain has resolved.  Primary problem now is group G streptococcal bacteremia.  Source is unclear, appreciate ID consultation for assistance with evaluation and management.  They suspect due to poor dentition, as he had been planned to have oral surgery before the pandemic postponed it.  MRI right shoulder and brain yesterday somewhat limited, but no evidence of septic involvement in either area.  Responding well to IV ceftriaxone, fever curve is improving, likely can transition to IV penicillin, but will defer to ID.  Repeat blood cultures negative thus far, will not pursue TEE unless blood cultures remain persistently positive.  Right shoulder pain is difficult to explain, as most of his significant findings on MRI appear to be chronic.  It is possible he already had an unstable shoulder from chronic rotator cuff tears and then developed an additional injury during hospitalization, as he remembers a specific movement causing severe pain, that has persisted since then.  We will ask orthopedics for advice, though suspect a shoulder sling, pain management, and PT will be the primary treatments.  Altered mental status is improving and was likely related to fever and bacteremia, but no evidence of meningitis, encephalitis, or septic emboli.  Reassuring that nystagmus and dizziness has also resolved.  If it resumes, could evaluate for BPPV.  [name], M.D., Ph.D. 05/05/2019, 1:49 PM

## 2019-05-05 NOTE — Progress Notes (Signed)
[facility]   Reason for visit: Follow up on bacteremia  Interval History: Blood culture results still pending, fever curve down, WBC remains elevated at 20.4.  Feels better overall.    Physical Exam: Constitutional:  Vitals:   05/05/19 0404 05/05/19 0915  BP: 135/63 (!) 148/65  Pulse:  66  Resp: 14   Temp: (!) 97.4 F (36.3 C)   SpO2:     patient appears in NAD Eyes: anicteric Respiratory: Normal respiratory effort; CTA B Cardiovascular: RRR GI: soft, nt, nd Neuro: more interactive, answering questions.   Review of Systems: Constitutional: negative for fevers, chills, malaise and anorexia Gastrointestinal: negative for nausea and diarrhea Integument/breast: negative for rash  Lab Results  Component Value Date   WBC 20.4 (H) 05/05/2019   HGB 9.8 (L) 05/05/2019   HCT 28.8 (L) 05/05/2019   MCV 95.4 05/05/2019   PLT 314 05/05/2019    Lab Results  Component Value Date   CREATININE 1.09 05/05/2019   BUN 16 05/05/2019   NA 135 05/05/2019   K 3.8 05/05/2019   CL 97 (L) 05/05/2019   CO2 23 05/05/2019    Lab Results  Component Value Date   ALT 12 05/02/2019   AST 11 (L) 05/02/2019   ALKPHOS 109 05/02/2019     Microbiology: Recent Results (from the past 240 hour(s))  SARS Coronavirus 2 (CEPHEID - Performed in [facility] hospital lab), Hosp Order     Status: None   Collection Time: 04/28/19  1:01 PM  Result Value Ref Range Status   SARS Coronavirus 2 NEGATIVE NEGATIVE Final    Comment: (NOTE) SARS CoV 2 target nucleic acids are NOT DETECTED. The SARS CoV 2 RNA is generally detectable in upper and lower respiratory specimens during the acute phase of infection. The lowest concentration of SARS CoV 2 viral copies this assay can detect is 250 copies per mL. A negative result does not preclude SARS CoV 2 infection and should not be used as the sole basis for treatment or other patient management decisions. A negative result may  occur with improper specimen collection and handling, submission  of specimen other than nasopharyngeal swab, presence of viral mutation(s) within the areas targeted by this assay, and inadequate number of viral copies (less than 250 copies per mL). A negative result must be combined with clinical observations, patient history,  and epidemiological information. The expected result is Negative. Fact Sheet for Patients:  https PumpkinSearch.com.eewww.fda.gov media 161096136312 download Fact Sheet for Healthcare Providers:  https PumpkinSearch.com.eewww.fda.gov media 2068422645136313 dow nload This test is not yet approved or cleared by the [place] FDA and  has been authorized for detection and/or diagnosis of SARS CoV 2 by FDA under an Emergency Use Authorization (EUA).  This EUA will remain in effect (meaning this test can be used) for the duration of  the COVID19 declaration under Section 564(b)(1) of the Act, 21 U.S.C.  section 360bbb-3(b)(1), unless the authorization is terminated or revoked sooner. Performed at [facility], [address] [place], [place] [id number]   MRSA PCR Screening     Status: None   Collection Time: 04/28/19  3:05 PM  Result Value Ref Range Status   MRSA by PCR NEGATIVE NEGATIVE Final    Comment:        The GeneXpert MRSA Assay (FDA approved for NASAL specimens only), is one component of a comprehensive MRSA colonization surveillance program. It is not intended to diagnose MRSA infection nor  to guide or monitor treatment for MRSA infections. Performed at [facility], [address], [place], [place] [address]   Culture, Urine     Status: Abnormal   Collection Time: 05/03/19  8:19 PM  Result Value Ref Range Status   Specimen Description URINE, RANDOM  Final   Special Requests   Final    NONE Performed at [facility], [address], [place], [place] [address]    Culture MULTIPLE SPECIES PRESENT, SUGGEST RECOLLECTION (A)  Final   Report Status 05/04/2019 FINAL  Final   Culture, blood (routine x 2)     Status: None (Preliminary result)   Collection Time: 05/03/19  8:55 PM  Result Value Ref Range Status   Specimen Description BLOOD RIGHT ARM  Final   Special Requests   Final    BOTTLES DRAWN AEROBIC AND ANAEROBIC Blood Culture adequate volume   Culture  Setup Time   Final    GRAM POSITIVE COCCI IN CHAINS IN BOTH AEROBIC AND ANAEROBIC BOTTLES Organism ID to follow CRITICAL RESULT CALLED TO, READ BACK BY AND VERIFIED WITH: Orie Fisherman PharmD 12:00 05/04/19 ([email or web address])    Culture   Final    NO GROWTH < 12 HOURS Performed at [facility], [address] [place], [place] [address]    Report Status PENDING  Incomplete  Blood Culture ID Panel (Reflexed)     Status: Abnormal   Collection Time: 05/03/19  8:55 PM  Result Value Ref Range Status   Enterococcus species NOT DETECTED NOT DETECTED Final   Listeria monocytogenes NOT DETECTED NOT DETECTED Final   Staphylococcus species NOT DETECTED NOT DETECTED Final   Staphylococcus aureus (BCID) NOT DETECTED NOT DETECTED Final   Streptococcus species DETECTED (A) NOT DETECTED Final    Comment: Not Enterococcus species, Streptococcus agalactiae, Streptococcus pyogenes, or Streptococcus pneumoniae. CRITICAL RESULT CALLED TO, READ BACK BY AND VERIFIED WITH: Orie Fisherman PharmD 12:00 05/04/19 ([email or web address])    Streptococcus agalactiae NOT DETECTED NOT DETECTED Final   Streptococcus pneumoniae NOT DETECTED NOT DETECTED Final   Streptococcus pyogenes NOT DETECTED NOT DETECTED Final   Acinetobacter baumannii NOT DETECTED NOT DETECTED Final   Enterobacteriaceae species NOT DETECTED NOT DETECTED Final   Enterobacter cloacae complex NOT DETECTED NOT DETECTED Final   Escherichia coli NOT DETECTED NOT DETECTED Final   Klebsiella oxytoca NOT DETECTED NOT DETECTED Final   Klebsiella pneumoniae NOT DETECTED NOT DETECTED Final   Proteus species NOT DETECTED NOT DETECTED Final   Serratia marcescens NOT DETECTED NOT DETECTED  Final   Haemophilus influenzae NOT DETECTED NOT DETECTED Final   Neisseria meningitidis NOT DETECTED NOT DETECTED Final   Pseudomonas aeruginosa NOT DETECTED NOT DETECTED Final   Candida albicans NOT DETECTED NOT DETECTED Final   Candida glabrata NOT DETECTED NOT DETECTED Final   Candida krusei NOT DETECTED NOT DETECTED Final   Candida parapsilosis NOT DETECTED NOT DETECTED Final   Candida tropicalis NOT DETECTED NOT DETECTED Final    Comment: Performed at [facility], [address], [place], [place] [address]  Culture, blood (routine x 2)     Status: None (Preliminary result)   Collection Time: 05/03/19  9:00 PM  Result Value Ref Range Status   Specimen Description BLOOD RIGHT HAND  Final   Special Requests   Final    BOTTLES DRAWN AEROBIC AND ANAEROBIC Blood Culture adequate volume   Culture  Setup Time   Final    GRAM POSITIVE COCCI IN CHAINS IN  BOTH AEROBIC AND ANAEROBIC BOTTLES CRITICAL VALUE NOTED.  VALUE IS CONSISTENT WITH PREVIOUSLY REPORTED AND CALLED VALUE.    Culture   Final    NO GROWTH < 12 HOURS Performed at [facility], [address] [place], [place] [address]    Report Status PENDING  Incomplete  SARS Coronavirus 2 (CEPHEID - Performed in [facility] hospital lab), Hosp Order     Status: None   Collection Time: 05/04/19  3:29 AM  Result Value Ref Range Status   SARS Coronavirus 2 NEGATIVE NEGATIVE Final    Comment: (NOTE) If result is NEGATIVE SARS-CoV-2 target nucleic acids are NOT DETECTED. The SARS-CoV-2 RNA is generally detectable in upper and lower  respiratory specimens during the acute phase of infection. The lowest  concentration of SARS-CoV-2 viral copies this assay can detect is 250  copies / mL. A negative result does not preclude SARS-CoV-2 infection  and should not be used as the sole basis for treatment or other  patient management decisions.  A negative result may occur with  improper specimen collection / handling, submission of  specimen other  than nasopharyngeal swab, presence of viral mutation(s) within the  areas targeted by this assay, and inadequate number of viral copies  (<250 copies / mL). A negative result must be combined with clinical  observations, patient history, and epidemiological information. If result is POSITIVE SARS-CoV-2 target nucleic acids are DETECTED. The SARS-CoV-2 RNA is generally detectable in upper and lower  respiratory specimens dur ing the acute phase of infection.  Positive  results are indicative of active infection with SARS-CoV-2.  Clinical  correlation with patient history and other diagnostic information is  necessary to determine patient infection status.  Positive results do  not rule out bacterial infection or co-infection with other viruses. If result is PRESUMPTIVE POSTIVE SARS-CoV-2 nucleic acids MAY BE PRESENT.   A presumptive positive result was obtained on the submitted specimen  and confirmed on repeat testing.  While 2019 novel coronavirus  (SARS-CoV-2) nucleic acids may be present in the submitted sample  additional confirmatory testing may be necessary for epidemiological  and / or clinical management purposes  to differentiate between  SARS-CoV-2 and other Sarbecovirus currently known to infect humans.  If clinically indicated additional testing with an alternate test  methodology [phone]) is advised. The SARS-CoV-2 RNA is generally  detectable in upper and lower respiratory sp ecimens during the acute  phase of infection. The expected result is Negative. Fact Sheet for Patients:  BoilerBrush.com.cy Fact Sheet for Healthcare Providers: https://pope.com/ This test is not yet approved or cleared by the [place] FDA and has been authorized for detection and/or diagnosis of SARS-CoV-2 by FDA under an Emergency Use Authorization (EUA).  This EUA will remain in effect (meaning this test can be used) for the  duration of the COVID-19 declaration under Section 564(b)(1) of the Act, 21 U.S.C. section 360bbb-3(b)(1), unless the authorization is terminated or revoked sooner. Performed at [facility], [address], [place], [place] [address]     Impression/Plan:  1. Bacteremia - Strep species, not yet identified.  Repeat cultures sent this am.  No indication for TEE unless persistently positive blood cultures.   2.  AMS - improved and oriented today.  No LP indicated.   3.  Right shoulder injury - rotator cuff injury noted on MRI.

## 2019-05-05 NOTE — Progress Notes (Signed)
Physical Therapy Treatment Patient Details Name: William Gonzalez MRN: [id number] DOB: 21-Oct-1956 Today's Date: 05/05/2019    History of Present Illness Pt is a 63 y.o. male admitted 04/28/19 with chest pain and AMS. ECG showed no acute changes. UDS (+) cocaine. Gr + bacteriemia; potentially due to poor dentition. Head CT negative for acute abnormality. MRI with chronic R rotator cuff injury. PMH includes CAD (s/p stenting), polysubstance abuse, stroke, HTN.   PT Comments    Pt slowly progressing with mobility, remains limited by fatigue this session. Currently requires modA to maintain static and dynamic standing balance. Continue to recommend SNF-level therapies to maximize functional mobility and independence.   Sitting BP 98/73 Standing BP 111/63    Follow Up Recommendations  SNF;Supervision/Assistance - 24 hour     Equipment Recommendations  None recommended by PT    Recommendations for Other Services       Precautions / Restrictions Precautions Precautions: Fall Restrictions Weight Bearing Restrictions: No    Mobility  Bed Mobility Overal bed mobility: Needs Assistance Bed Mobility: Rolling;Sidelying to Sit Rolling: Independent Sidelying to sit: Mod assist;HOB elevated       General bed mobility comments: ModA for UE support to assist scooting hips to EOB and trunk elevation; able to scoot towards EOB without physical assist. Seated BP 98/73; pt asymptomatic  Transfers Overall transfer level: Needs assistance Equipment used: 1 person hand held assist Transfers: Sit to/from Stand Sit to Stand: Mod assist         General transfer comment: ModA to assist trunk elevation and maintain balance upon steadying; reliant on HHA to maintain balance. BP 111/65  Ambulation/Gait             General Gait Details: ModA to maintain balance taking steps towards HOB; pt declined further mobility due to fatigue   Stairs             Wheelchair Mobility     Modified Rankin (Stroke Patients Only)       Balance Overall balance assessment: Needs assistance Sitting-balance support: No upper extremity supported;Feet supported Sitting balance-Leahy Scale: Fair     Standing balance support: During functional activity;Single extremity supported Standing balance-Leahy Scale: Poor Standing balance comment: relies on UE support                            Cognition Arousal/Alertness: Awake/alert Behavior During Therapy: WFL for tasks assessed/performed Overall Cognitive Status: No family/caregiver present to determine baseline cognitive functioning Area of Impairment: Following commands;Safety/judgement;Problem solving                       Following Commands: Follows one step commands with increased time Safety/Judgement: Decreased awareness of safety;Decreased awareness of deficits   Problem Solving: Slow processing;Requires verbal cues General Comments: Reports very tired having not slept in two days      Exercises      General Comments General comments (skin integrity, edema, etc.): C/o chronic R shoulder pain; discussed rotator cuff injury, but pt unsure of onset/mechanism of injury.      Pertinent Vitals/Pain Pain Assessment: Faces Faces Pain Scale: Hurts a little bit Pain Location: Generalized Pain Descriptors / Indicators: Discomfort Pain Intervention(s): Limited activity within patient's tolerance    Home Living                      Prior Function  PT Goals (current goals can now be found in the care plan section) Acute Rehab PT Goals Patient Stated Goal: to go to rehab at [place] [facility] PT Goal Formulation: With patient Time For Goal Achievement: 05/14/19 Potential to Achieve Goals: Good Progress towards PT goals: Progressing toward goals    Frequency    Min 2X/week      PT Plan Current plan remains appropriate    Co-evaluation              AM-PAC PT "6  Clicks" Mobility   Outcome Measure  Help needed turning from your back to your side while in a flat bed without using bedrails?: None Help needed moving from lying on your back to sitting on the side of a flat bed without using bedrails?: A Lot Help needed moving to and from a bed to a chair (including a wheelchair)?: A Lot Help needed standing up from a chair using your arms (e.g., wheelchair or bedside chair)?: A Lot Help needed to walk in hospital room?: A Lot Help needed climbing 3-5 steps with a railing? : A Lot 6 Click Score: 14    End of Session   Activity Tolerance: Patient limited by fatigue Patient left: in bed;with call bell/phone within reach;with bed alarm set Nurse Communication: Mobility status PT Visit Diagnosis: Unsteadiness on feet (R26.81);Muscle weakness (generalized) (M62.81)     Time: [id number] PT Time Calculation (min) (ACUTE ONLY): 13 min  Charges:  $Therapeutic Activity: 8-22 mins                    William Gonzalez, PT, DPT Acute Rehabilitation Services  Pager [phone] Office [phone]  William Gonzalez 05/05/2019, 2:57 PM

## 2019-05-05 NOTE — TOC Progression Note (Signed)
Transition of Care [facility]) - Progression Note    Patient Details  Name: William Gonzalez MRN: [id number] Date of Birth: [birth year]  [facility]) CM/SW Contact  Eduard Roux, [place] Phone Number: 05/05/2019, 5:35 PM  Clinical Narrative:    Patient has no bed offers  12:47pm- CSW faxed requested information to PSARR for approval- PASARR is required for SNF -  5:30pm-CSW check- PASARR remains pending -CSW awaiting on response.  CSW - visited with the patient and provided update on bed offers. Patient remains willing to go to SNF. CSW discussed SNF current rules no visitors and got consent patient is willing to abide by all rules of the SNF. CSW will continue to follow and advocate to SNFs for placement.   Antony Blackbird, MSW, [facility] Clinical Social Worker [phone]    Expected Discharge Plan: Skilled Nursing Facility Barriers to Discharge: Active Substance Use - Placement, SNF Pending bed offer  Expected Discharge Plan and Services Expected Discharge Plan: Skilled Nursing Facility   Discharge Planning Services: CM Consult   Living arrangements for the past 2 months: Hotel/Motel                                       Social Determinants of Health (SDOH) Interventions    Readmission Risk Interventions Readmission Risk Prevention Plan 04/29/2019 04/29/2019  Transportation Screening - Complete  Medication Review [facility]) Complete Complete  PCP or Specialist appointment within 3-5 days of discharge (No Data) -  HRI or Home Care Consult Complete -  SW Recovery Care/Counseling Consult Complete -  Palliative Care Screening Not Applicable -  Skilled Nursing Facility Not Complete -  Some recent data might be hidden

## 2019-05-06 DIAGNOSIS — F1410 Cocaine abuse, uncomplicated: Secondary | ICD-10-CM

## 2019-05-06 DIAGNOSIS — B954 Other streptococcus as the cause of diseases classified elsewhere: Secondary | ICD-10-CM

## 2019-05-06 LAB — CULTURE, BLOOD (ROUTINE X 2)
Special Requests: ADEQUATE
Special Requests: ADEQUATE

## 2019-05-06 LAB — CBC WITH DIFFERENTIAL/PLATELET
Abs Immature Granulocytes: 0.08 10*3/uL — ABNORMAL HIGH (ref 0.00–0.07)
Basophils Absolute: 0 10*3/uL (ref 0.0–0.1)
Basophils Relative: 0 %
Eosinophils Absolute: 0.2 10*3/uL (ref 0.0–0.5)
Eosinophils Relative: 1 %
HCT: 28.5 % — ABNORMAL LOW (ref 39.0–52.0)
Hemoglobin: 9.8 g/dL — ABNORMAL LOW (ref 13.0–17.0)
Immature Granulocytes: 1 %
Lymphocytes Relative: 8 %
Lymphs Abs: 1.1 10*3/uL (ref 0.7–4.0)
MCH: 32.8 pg (ref 26.0–34.0)
MCHC: 34.4 g/dL (ref 30.0–36.0)
MCV: 95.3 fL (ref 80.0–100.0)
Monocytes Absolute: 1.5 10*3/uL — ABNORMAL HIGH (ref 0.1–1.0)
Monocytes Relative: 10 %
Neutro Abs: 11.3 10*3/uL — ABNORMAL HIGH (ref 1.7–7.7)
Neutrophils Relative %: 80 %
Platelets: 326 10*3/uL (ref 150–400)
RBC: 2.99 MIL/uL — ABNORMAL LOW (ref 4.22–5.81)
RDW: 11.9 % (ref 11.5–15.5)
WBC: 14.2 10*3/uL — ABNORMAL HIGH (ref 4.0–10.5)
nRBC: 0 % (ref 0.0–0.2)

## 2019-05-06 LAB — GLUCOSE, CAPILLARY
Glucose-Capillary: 112 mg/dL — ABNORMAL HIGH (ref 70–99)
Glucose-Capillary: 195 mg/dL — ABNORMAL HIGH (ref 70–99)
Glucose-Capillary: 371 mg/dL — ABNORMAL HIGH (ref 70–99)
Glucose-Capillary: 211 mg/dL — ABNORMAL HIGH (ref 70–99)

## 2019-05-06 LAB — BASIC METABOLIC PANEL
Anion gap: 10 (ref 5–15)
BUN: 22 mg/dL (ref 8–23)
CO2: 25 mmol/L (ref 22–32)
Calcium: 8.4 mg/dL — ABNORMAL LOW (ref 8.9–10.3)
Chloride: 102 mmol/L (ref 98–111)
Creatinine, Ser: 1.28 mg/dL — ABNORMAL HIGH (ref 0.61–1.24)
GFR calc Af Amer: >60 mL/min (ref >60)
GFR calc non Af Amer: 58 mL/min — ABNORMAL LOW (ref >60)
Glucose, Bld: 130 mg/dL — ABNORMAL HIGH (ref 70–99)
Potassium: 3.7 mmol/L (ref 3.5–5.1)
Sodium: 137 mmol/L (ref 135–145)

## 2019-05-06 MED ORDER — SODIUM CHLORIDE 0.9 % IV BOLUS
500.0000 mL | Freq: Once | INTRAVENOUS | Status: AC
  Administered 2019-05-06: 09:00:00 500 mL via INTRAVENOUS

## 2019-05-06 MED ORDER — PENICILLIN G POTASSIUM 20000000 UNITS IJ SOLR
12.0000 10*6.[IU] | Freq: Two times a day (BID) | INTRAVENOUS | Status: DC
  Administered 2019-05-06 – 2019-05-10 (×8): 12 10*6.[IU] via INTRAVENOUS
  Filled 2019-05-06 (×11): qty 12

## 2019-05-06 NOTE — TOC Progression Note (Signed)
Transition of Care [facility]) - Progression Note    Patient Details  Name: William Gonzalez MRN: [id number] Date of Birth: May 07, 1956  Transition of Care [facility]) CM/SW Contact  Eduard Roux, [place] Phone Number: 05/06/2019, 3:14 PM  Clinical Narrative:    Patient has no bed offers- patient will be difficult to place- CSW Chiropodist is aware of placement barriers. CSW will continue to follow and seek placement.    CSW reached out to [facility], [facility]- they declined patient.    CSW visited with the patient to assist PASARR Reveiwer with assessment by phone. PASARR level II was completed today- CSW awaiting PSARR approval and number  for SNF CSW updated the patient on placement and encourage the patient to continue to worker with PT.   Patient  declined by: [place], [place], [place], [place], [facility], [facility] and [facility]   Expected Discharge Plan: Skilled Nursing Facility Barriers to Discharge: Active Substance Use - Placement, SNF Pending bed offer  Expected Discharge Plan and Services Expected Discharge Plan: Skilled Nursing Facility   Discharge Planning Services: CM Consult   Living arrangements for the past 2 months: Hotel/Motel                                       Social Determinants of Health (SDOH) Interventions    Readmission Risk Interventions Readmission Risk Prevention Plan 04/29/2019 04/29/2019  Transportation Screening - Complete  Medication Review Oceanographer) Complete Complete  PCP or Specialist appointment within 3-5 days of discharge (No Data) -  HRI or Home Care Consult Complete -  SW Recovery Care/Counseling Consult Complete -  Palliative Care Screening Not Applicable -  Skilled Nursing Facility Not Complete -  Some recent data might be hidden

## 2019-05-06 NOTE — Progress Notes (Addendum)
Subjective:  No acute events overnight. He complains of not sleeping well at night despite sleep aid medicine. No other acute complaints. His shoulder pain is better. No dizziness, headache.   Objective:  Vital signs in last 24 hours: Vitals:   05/06/19 0405 05/06/19 0706 05/06/19 1052 05/06/19 1629  BP: 139/62 (!) 150/63 (!) 149/66 (!) 161/69  Pulse:      Resp: 19  20 16   Temp: 98 F (36.7 C)  97.8 F (36.6 C) 98 F (36.7 C)  TempSrc: Oral  Oral Oral  SpO2:      Weight:      Height:       Physicalexam: General:He appears comfortable today. Lying in bed in no acute distress CV:RRR, nl S1S2. Pulmonary exam: No respiratory distress, no crackles, no wheezing. Abdomen: Soft and nontender to palpation, BS are present Musculoskeletal:Extremities are normal appearing. No edema Neurlogy exam: He is alert and oriented x 3.   Assessment/Plan:  Principal Problem:   Substance induced mood disorder (HCC) Active Problems:   Coronary artery disease   Hypertension   Tobacco abuse   Alcohol abuse   Diabetes mellitus without complication (HCC)   Cocaine abuse (HCC)   Suicidal ideation   Chest pain   Atrial fibrillation (HCC)  63 year old male with past medical history of CAD status post stent, DM HTN, history of CVA,alcohol and cocaine use, smoking, MDD came to the emergency department today due toworsening of intermittentchest pain in setting of chest pain. He is currently treated for streptococcal bacteremia.  Streptococcus G bacteriemia: With no certain known source:  Is currently on Ceftriaxone. (started 05/04/2019)  Patient looks better today, A & O, no more dizziness, no nystagmus on exam. No further fever and Leukocytosis down to 14000 today from 20000. Mental status is normal ->No LP at this point ID is following. Appreciate recommendations. repeat blood culture is negative so far (no growth for 1 day). No TEE at this point.  -Switch Ceftriaxone to Penicillin  -Will prescribe Amoxicillin at DC  -ID is on board. Appreciate recommendations -No new IV line and no PICC line insertion  AKI: Cr increased to 1.28 today. Likely prerenal due to poor po fluid intake. - 500 ml  NSBolus  -BMP daily  Rt rotator cuff injury: Pain is better today. -Orthopedics reviewed MRI and recommended outpatient referral as needed and PT -Continue PRN Toradol  -ContinueTylenol and Voltaren gel PRN   Chest pain in setting of cocaine use :Chest painresolved.UDS was positive for cocaine. No evidence of acute ischemia. Stress test performed and was low risk per cardiology. No further work up at this point.  -Continue home ASA 80 mg QD and Plavix 75 mg QD -Continue home Lipitor  -BMPdaily  A-fibwith RVR:  Remained sinus.   -Continue carvedilol 25 mg twice daily -Cardiac monitoring  Normocytic Anemia: Chronic IDA. Hbis stable.  -Continuehome ferrous sulfate -Continue folic acid  DM II:On Metfromin 1000 mg BID at home.CBG mostly ranging between 110- 200. BG elevated at 371 this PM. Will monitor CBG and escalate Insulin scale if BG remains elevated.  -ContinueSensitive SSI  -CBG monitoring  Alcohol use: No withdrawal.  -Thiamin 100 mg POQD -Continue folic acid  MDD, Hx of suicidal thoughts: Psychiatry consulted for suicidal thought. Did not recommend psychiatry admission at this point, and resumed home meds. He has been stable. Mood and affect are normal currently. -Continue withAmantadin 100 mg BID -Continuehome Abilify 2 mg QD and Vortioxetin 10 mg QD -Close follow-up withpsychiatryout patient. -  Appreciate social worker follow-up for SNF placement as well as providing resources for substance abuse treatment   Diet:HH carb mod IV fluid:NS bolus VTE ppx:Sub Q Hep Code status:Full  Dispo: Anticipated discharge in approximately 3-4 days  William Gonzalez, William Yanke, MD 05/06/2019, 5:07 PM Pager: [phone]

## 2019-05-06 NOTE — Progress Notes (Signed)
  Date: 05/06/2019  Patient name: William Gonzalez  Medical record number: [id number]  Date of birth: 10/30/1956   I have seen and evaluated this patient and I have discussed the plan of care with the house staff. Please see their note for complete details. I concur with their findings with the following additions/corrections:   Doing well on ceftriaxone for group G streptococcal bacteremia. No fevers. Will switch to penicillin IV, with plan to transition to amoxicillin at discharge.   Mental status much improved, encouraged him to stay awake and active during day to help with sleep at night.  Shoulder pain much better today, continue conservative management. Will benefit from ongoing PT for this and overall strength and mobility given his significant debilitation.   [name], M.D., Ph.D. 05/06/2019, 11:01 AM

## 2019-05-06 NOTE — Progress Notes (Addendum)
[facility]   Reason for visit: Follow up on bacteremia  Interval History: Blood culture results with Strep Group G, remains afebrile, WBC improved at 14.2.  Feels better overall.    Physical Exam: Constitutional:  Vitals:   05/06/19 0240 05/06/19 0405  BP:  139/62  Pulse:    Resp: 17 19  Temp:  98 F (36.7 C)  SpO2:     patient appears in NAD, up in chair Eyes: anicteric Respiratory: Normal respiratory effort; CTA B Cardiovascular: RRR GI: soft, nt, nd Neuro: non-focal, alert and oriented  Review of Systems: Constitutional: negative for fevers, chills, malaise and anorexia Gastrointestinal: negative for nausea and diarrhea Integument/breast: negative for rash  Lab Results  Component Value Date   WBC 14.2 (H) 05/06/2019   HGB 9.8 (L) 05/06/2019   HCT 28.5 (L) 05/06/2019   MCV 95.3 05/06/2019   PLT 326 05/06/2019    Lab Results  Component Value Date   CREATININE 1.28 (H) 05/06/2019   BUN 22 05/06/2019   NA 137 05/06/2019   K 3.7 05/06/2019   CL 102 05/06/2019   CO2 25 05/06/2019    Lab Results  Component Value Date   ALT 12 05/02/2019   AST 11 (L) 05/02/2019   ALKPHOS 109 05/02/2019     Microbiology: Recent Results (from the past 240 hour(s))  SARS Coronavirus 2 (CEPHEID - Performed in [facility] hospital lab), Hosp Order     Status: None   Collection Time: 04/28/19  1:01 PM  Result Value Ref Range Status   SARS Coronavirus 2 NEGATIVE NEGATIVE Final    Comment: (NOTE) SARS CoV 2 target nucleic acids are NOT DETECTED. The SARS CoV 2 RNA is generally detectable in upper and lower respiratory specimens during the acute phase of infection. The lowest concentration of SARS CoV 2 viral copies this assay can detect is 250 copies per mL. A negative result does not preclude SARS CoV 2 infection and should not be used as the sole basis for treatment or other patient management decisions. A negative result may occur with  improper specimen collection and handling, submission  of specimen other than nasopharyngeal swab, presence of viral mutation(s) within the areas targeted by this assay, and inadequate number of viral copies (less than 250 copies per mL). A negative result must be combined with clinical observations, patient history,  and epidemiological information. The expected result is Negative. Fact Sheet for Patients:  https PumpkinSearch.com.ee media 707867 download Fact Sheet for Healthcare Providers:  https PumpkinSearch.com.ee media (760)814-1191 dow nload This test is not yet approved or cleared by the [place] FDA and  has been authorized for detection and/or diagnosis of SARS CoV 2 by FDA under an Emergency Use Authorization (EUA).  This EUA will remain in effect (meaning this test can be used) for the duration of  the COVID19 declaration under Section 564(b)(1) of the Act, 21 U.S.C.  section 360bbb-3(b)(1), unless the authorization is terminated or revoked sooner. Performed at [facility], [address] [place], [place] [address]   MRSA PCR Screening     Status: None   Collection Time: 04/28/19  3:05 PM  Result Value Ref Range Status   MRSA by PCR NEGATIVE NEGATIVE Final    Comment:        The GeneXpert MRSA Assay (FDA approved for NASAL specimens only), is one component of a comprehensive MRSA colonization surveillance program. It is not intended to diagnose MRSA infection nor  to guide or monitor treatment for MRSA infections. Performed at [facility], [address] [place], [place] [address]   Culture, Urine     Status: Abnormal   Collection Time: 05/03/19  8:19 PM  Result Value Ref Range Status   Specimen Description URINE, RANDOM  Final   Special Requests   Final    NONE Performed at [facility], [address] [place], [place] [address]    Culture MULTIPLE SPECIES PRESENT, SUGGEST RECOLLECTION (A)  Final   Report Status 05/04/2019 FINAL  Final  Culture, blood  (routine x 2)     Status: Abnormal   Collection Time: 05/03/19  8:55 PM  Result Value Ref Range Status   Specimen Description BLOOD RIGHT ARM  Final   Special Requests   Final    BOTTLES DRAWN AEROBIC AND ANAEROBIC Blood Culture adequate volume   Culture  Setup Time   Final    GRAM POSITIVE COCCI IN CHAINS IN BOTH AEROBIC AND ANAEROBIC BOTTLES CRITICAL RESULT CALLED TO, READ BACK BY AND VERIFIED WITH: Orie Fisherman PharmD 12:00 05/04/19 ([email or web address])    Culture (A)  Final    STREPTOCOCCUS GROUP G SUSCEPTIBILITIES PERFORMED ON PREVIOUS CULTURE WITHIN THE LAST 5 DAYS. Performed at [facility], [address] [place], [place] [address]    Report Status 05/06/2019 FINAL  Final  Blood Culture ID Panel (Reflexed)     Status: Abnormal   Collection Time: 05/03/19  8:55 PM  Result Value Ref Range Status   Enterococcus species NOT DETECTED NOT DETECTED Final   Listeria monocytogenes NOT DETECTED NOT DETECTED Final   Staphylococcus species NOT DETECTED NOT DETECTED Final   Staphylococcus aureus (BCID) NOT DETECTED NOT DETECTED Final   Streptococcus species DETECTED (A) NOT DETECTED Final    Comment: Not Enterococcus species, Streptococcus agalactiae, Streptococcus pyogenes, or Streptococcus pneumoniae. CRITICAL RESULT CALLED TO, READ BACK BY AND VERIFIED WITH: Orie Fisherman PharmD 12:00 05/04/19 ([email or web address])    Streptococcus agalactiae NOT DETECTED NOT DETECTED Final   Streptococcus pneumoniae NOT DETECTED NOT DETECTED Final   Streptococcus pyogenes NOT DETECTED NOT DETECTED Final   Acinetobacter baumannii NOT DETECTED NOT DETECTED Final   Enterobacteriaceae species NOT DETECTED NOT DETECTED Final   Enterobacter cloacae complex NOT DETECTED NOT DETECTED Final   Escherichia coli NOT DETECTED NOT DETECTED Final   Klebsiella oxytoca NOT DETECTED NOT DETECTED Final   Klebsiella pneumoniae NOT DETECTED NOT DETECTED Final   Proteus species NOT DETECTED NOT DETECTED Final   Serratia marcescens NOT  DETECTED NOT DETECTED Final   Haemophilus influenzae NOT DETECTED NOT DETECTED Final   Neisseria meningitidis NOT DETECTED NOT DETECTED Final   Pseudomonas aeruginosa NOT DETECTED NOT DETECTED Final   Candida albicans NOT DETECTED NOT DETECTED Final   Candida glabrata NOT DETECTED NOT DETECTED Final   Candida krusei NOT DETECTED NOT DETECTED Final   Candida parapsilosis NOT DETECTED NOT DETECTED Final   Candida tropicalis NOT DETECTED NOT DETECTED Final    Comment: Performed at [facility], [address] [place], [place] [address]  Culture, blood (routine x 2)     Status: Abnormal   Collection Time: 05/03/19  9:00 PM  Result Value Ref Range Status   Specimen Description BLOOD RIGHT HAND  Final   Special Requests   Final    BOTTLES DRAWN AEROBIC AND ANAEROBIC Blood Culture adequate volume   Culture  Setup Time   Final    GRAM POSITIVE COCCI IN CHAINS  IN BOTH AEROBIC AND ANAEROBIC BOTTLES CRITICAL VALUE NOTED.  VALUE IS CONSISTENT WITH PREVIOUSLY REPORTED AND CALLED VALUE. Performed at [facility], [address] [place], [place] [address]    Culture STREPTOCOCCUS GROUP G (A)  Final   Report Status 05/06/2019 FINAL  Final   Organism ID, Bacteria STREPTOCOCCUS GROUP G  Final      Susceptibility   Streptococcus group g - MIC*    CLINDAMYCIN RESISTANT Resistant     AMPICILLIN <=0.25 SENSITIVE Sensitive     ERYTHROMYCIN >=8 RESISTANT Resistant     VANCOMYCIN 0.5 SENSITIVE Sensitive     CEFTRIAXONE <=0.12 SENSITIVE Sensitive     LEVOFLOXACIN 0.5 SENSITIVE Sensitive     PENICILLIN Value in next row Sensitive      SENSITIVE<=0.06    * STREPTOCOCCUS GROUP G  SARS Coronavirus 2 (CEPHEID - Performed in [facility] hospital lab), Hosp Order     Status: None   Collection Time: 05/04/19  3:29 AM  Result Value Ref Range Status   SARS Coronavirus 2 NEGATIVE NEGATIVE Final    Comment: (NOTE) If result is NEGATIVE SARS-CoV-2 target nucleic acids are NOT DETECTED. The  SARS-CoV-2 RNA is generally detectable in upper and lower  respiratory specimens during the acute phase of infection. The lowest  concentration of SARS-CoV-2 viral copies this assay can detect is 250  copies / mL. A negative result does not preclude SARS-CoV-2 infection  and should not be used as the sole basis for treatment or other  patient management decisions.  A negative result may occur with  improper specimen collection / handling, submission of specimen other  than nasopharyngeal swab, presence of viral mutation(s) within the  areas targeted by this assay, and inadequate number of viral copies  (<250 copies / mL). A negative result must be combined with clinical  observations, patient history, and epidemiological information. If result is POSITIVE SARS-CoV-2 target nucleic acids are DETECTED. The SARS-CoV-2 RNA is generally detectable in upper and lower  respiratory specimens dur ing the acute phase of infection.  Positive  results are indicative of active infection with SARS-CoV-2.  Clinical  correlation with patient history and other diagnostic information is  necessary to determine patient infection status.  Positive results do  not rule out bacterial infection or co-infection with other viruses. If result is PRESUMPTIVE POSTIVE SARS-CoV-2 nucleic acids MAY BE PRESENT.   A presumptive positive result was obtained on the submitted specimen  and confirmed on repeat testing.  While 2019 novel coronavirus  (SARS-CoV-2) nucleic acids may be present in the submitted sample  additional confirmatory testing may be necessary for epidemiological  and / or clinical management purposes  to differentiate between  SARS-CoV-2 and other Sarbecovirus currently known to infect humans.  If clinically indicated additional testing with an alternate test  methodology [phone]) is advised. The SARS-CoV-2 RNA is generally  detectable in upper and lower respiratory sp ecimens during the acute   phase of infection. The expected result is Negative. Fact Sheet for Patients:  BoilerBrush.com.cy Fact Sheet for Healthcare Providers: https://pope.com/ This test is not yet approved or cleared by the [place] FDA and has been authorized for detection and/or diagnosis of SARS-CoV-2 by FDA under an Emergency Use Authorization (EUA).  This EUA will remain in effect (meaning this test can be used) for the duration of the COVID-19 declaration under Section 564(b)(1) of the Act, 21 U.S.C. section 360bbb-3(b)(1), unless the authorization is terminated or revoked sooner. Performed at [facility], [address] [place], [place] [id number]     Impression/Plan:  1. Bacteremia - Group G Strep  Repeat cultures sent yesterday and ngtd.  No indication for TEE unless persistently positive blood cultures.  Can use penicillin IV while inpatient, 5 days of oral amoxicillin at discharge  2.  Substance abuse - positive for cocaine.  Counseled.   I will sign off, call with questions. thanks

## 2019-05-07 DIAGNOSIS — R7881 Bacteremia: Secondary | ICD-10-CM | POA: Diagnosis not present

## 2019-05-07 DIAGNOSIS — B955 Unspecified streptococcus as the cause of diseases classified elsewhere: Secondary | ICD-10-CM | POA: Diagnosis not present

## 2019-05-07 LAB — BASIC METABOLIC PANEL
Anion gap: 9 (ref 5–15)
BUN: 17 mg/dL (ref 8–23)
CO2: 26 mmol/L (ref 22–32)
Calcium: 8.8 mg/dL — ABNORMAL LOW (ref 8.9–10.3)
Chloride: 100 mmol/L (ref 98–111)
Creatinine, Ser: 1.08 mg/dL (ref 0.61–1.24)
GFR calc Af Amer: >60 mL/min (ref >60)
GFR calc non Af Amer: >60 mL/min (ref >60)
Glucose, Bld: 197 mg/dL — ABNORMAL HIGH (ref 70–99)
Potassium: 4.2 mmol/L (ref 3.5–5.1)
Sodium: 135 mmol/L (ref 135–145)

## 2019-05-07 LAB — CBC WITH DIFFERENTIAL/PLATELET
Abs Immature Granulocytes: 0.15 10*3/uL — ABNORMAL HIGH (ref 0.00–0.07)
Basophils Absolute: 0 10*3/uL (ref 0.0–0.1)
Basophils Relative: 0 %
Eosinophils Absolute: 0.2 10*3/uL (ref 0.0–0.5)
Eosinophils Relative: 1 %
HCT: 27.5 % — ABNORMAL LOW (ref 39.0–52.0)
Hemoglobin: 9.4 g/dL — ABNORMAL LOW (ref 13.0–17.0)
Immature Granulocytes: 1 %
Lymphocytes Relative: 8 %
Lymphs Abs: 1.4 10*3/uL (ref 0.7–4.0)
MCH: 32.6 pg (ref 26.0–34.0)
MCHC: 34.2 g/dL (ref 30.0–36.0)
MCV: 95.5 fL (ref 80.0–100.0)
Monocytes Absolute: 1.5 10*3/uL — ABNORMAL HIGH (ref 0.1–1.0)
Monocytes Relative: 9 %
Neutro Abs: 13.6 10*3/uL — ABNORMAL HIGH (ref 1.7–7.7)
Neutrophils Relative %: 81 %
Platelets: 354 10*3/uL (ref 150–400)
RBC: 2.88 MIL/uL — ABNORMAL LOW (ref 4.22–5.81)
RDW: 11.9 % (ref 11.5–15.5)
WBC: 16.8 10*3/uL — ABNORMAL HIGH (ref 4.0–10.5)
nRBC: 0 % (ref 0.0–0.2)

## 2019-05-07 LAB — GLUCOSE, CAPILLARY
Glucose-Capillary: 165 mg/dL — ABNORMAL HIGH (ref 70–99)
Glucose-Capillary: 281 mg/dL — ABNORMAL HIGH (ref 70–99)
Glucose-Capillary: 162 mg/dL — ABNORMAL HIGH (ref 70–99)
Glucose-Capillary: 177 mg/dL — ABNORMAL HIGH (ref 70–99)

## 2019-05-07 MED ORDER — KETOROLAC TROMETHAMINE 30 MG/ML IJ SOLN
30.0000 mg | Freq: Three times a day (TID) | INTRAMUSCULAR | Status: DC | PRN
  Administered 2019-05-07 – 2019-05-08 (×2): 30 mg via INTRAVENOUS
  Filled 2019-05-07 (×3): qty 1

## 2019-05-07 MED ORDER — LISINOPRIL 20 MG PO TABS: 20.0000 mg | ORAL_TABLET | Freq: Every day | ORAL | Status: DC

## 2019-05-07 MED ORDER — AMLODIPINE BESYLATE 10 MG PO TABS
10.0000 mg | ORAL_TABLET | Freq: Every day | ORAL | Status: DC
  Administered 2019-05-07 – 2019-05-09 (×3): 10 mg via ORAL
  Filled 2019-05-07 (×3): qty 1

## 2019-05-07 MED ORDER — LISINOPRIL 20 MG PO TABS
20.0000 mg | ORAL_TABLET | Freq: Every day | ORAL | Status: DC
  Administered 2019-05-07 – 2019-05-11 (×5): 20 mg via ORAL
  Filled 2019-05-07 (×5): qty 1

## 2019-05-07 NOTE — Progress Notes (Signed)
Inpatient Diabetes Program Recommendations  AACE/ADA: New Consensus Statement on Inpatient Glycemic Control   Target Ranges:  Prepandial:   less than 140 mg/dL      Peak postprandial:   less than 180 mg/dL (1-2 hours)      Critically ill patients:  140 - 180 mg/dL   Results for William Gonzalez, William Gonzalez (MRN [id number]) as of 05/07/2019 09:26  Ref. Range 05/06/2019 06:39 05/06/2019 11:36 05/06/2019 16:28 05/06/2019 21:42 05/07/2019 06:05  Glucose-Capillary Latest Ref Range: 70 - 99 mg/dL 982 (H) 641 (H) 583 (H) 211 (H) 165 (H)   Review of Glycemic Control  Diabetes history: DM2 Outpatient Diabetes medications:  Metformin XR 1000 mg BID Current orders for Inpatient glycemic control: Novolog 0-9 units TID with meals, Novolog 0-5 units QHS  Inpatient Diabetes Program Recommendations:   Insulin - Meal Coverage:Post prandial glucose is consistently elevated.  Please consider ordering Novolog 3 units TID with meals for meal coverage if patient eats at least 50% of meals.  Thanks, Orlando Penner, RN, MSN, CDE Diabetes Coordinator Inpatient Diabetes Program [phone] (Team Pager from 8am to 5pm)

## 2019-05-07 NOTE — Progress Notes (Signed)
Subjective:  [name] is doing well, still has some shoulder pain but otherwise doing ok. No dizziness, no headache. No other complaint and no acute event over night.  Objective:  Vital signs in last 24 hours: Vitals:   05/06/19 2020 05/07/19 0000 05/07/19 0226 05/07/19 0356  BP: (!) 180/66 (!) 173/65  (!) 165/64  Pulse: 74 64  (!) 59  Resp:  (!) 24  18  Temp: 98.1 F (36.7 C) 98.2 F (36.8 C)  (!) 97.5 F (36.4 C)  TempSrc: Oral Oral  Oral  SpO2: 91%   93%  Weight:   69.4 kg   Height:       Physicalexam: General:He appears comfortable today. Lying in bed in no acute distress CV:RRR, nl S1S2. Pulmonary exam: No respiratory distress, no crackles,no wheezing. Abdomen: Soft and nontender to palpation, BS are present Musculoskeletal:Extremities are normal appearing. No edema. Has tenderness on paraspinal area and right shoulder blade. Neurlogy exam: He is alert and oriented x 3.    Assessment/Plan:  Principal Problem:   Substance induced mood disorder (HCC) Active Problems:   Coronary artery disease   Hypertension   Tobacco abuse   Alcohol abuse   Diabetes mellitus without complication (HCC)   Cocaine abuse (HCC)   Suicidal ideation   Chest pain   Atrial fibrillation (HCC)  63 year old male with past medical history of CAD status post stent, DM HTN, history of CVA,alcohol and cocaine use, smoking, MDD came to the emergency department today due toworsening of intermittentchest pain in setting of chest pain. He is currently treated for streptococcal bacteremia.  Streptococcus G bacteriemia:  Remained asymptomatic, afebriel and clinically stable. Mental status is normal and he has been alert and oriented x 3. Repeat blood culture is negative so far. No TEE at this point.  With no certain known source. Started on Ceftriaxon 05/04/2019, switched to Penicillin G, 05/06/2019. Medically stable for DC to SNF.   -Continue Penicillin G while in hospital -Will  prescribe Amoxicillin at DC  -ID is following. Appreciate recommendations -No new IV line and no PICC line insertion -CBC daily -Awaiting SNF placement.  HTN: Hypertensive overnight and  today at 160s-180s. Asymptomatic. Resuming rest of home antihypertensive medications and will monitor.   -Amlodipine 10 mg daily -lisinopril 20 mg daily -Continue carvedilol 25 mg twice daily.  AKI: Cr increased to 1.28 yesterday.Improved to baseline 1.08 with IV F. Was likely prerenal due to poor po fluid intake.  -BMP daily  Rt rotator cuff injury: Pain is better but still present, and he requires Ketorolac and Voltaren gel.  -Orthopedics reviewed MRI and recommended outpatient referral as needed and PT -Continue PRNToradol (will consider to cut it down and switch to po NSAID) -ContinueTylenol and Voltaren gel PRN  -PT/OT eval and treat  Chest pain in setting of cocaine use :Chest painresolved.UDS was positive for cocaine. No evidence of acute ischemia. Stress test performed and was low risk per cardiology. No further work up at this point.  -Continue home ASA 80 mg QD and Plavix 75 mg QD -Continue home Lipitor  -BMPdaily  A-fibwith RVR: Remained sinus.   -Continue carvedilol 25 mg twice daily -Cardiac monitoring  Normocytic Anemia: Chronic IDA. Hbis stable.  -Continuehome ferrous sulfate  DM II:On Metfromin 1000 mg BID at home.CBG mostly ranging between 110- 200. BG elevated at 371 yesterday. Improved today.  -ContinueSensitive SSI  -CBG monitoring  Alcohol use: No withdrawal.  -Continue Thiamin 100 mg POQD -Continue folic acid  MDD, Hx  of suicidal thoughts: He has been stable and mood and affect remained normal.  Psychiatry consulted for recent suicidal thought. Did not recommend psychiatry admission at this point, and resumedhome meds.  -Continue withAmantadin 100 mg BID -Continuehome Abilify 2 mg QD and Vortioxetin 10 mg QD -Close  follow-up withpsychiatryout patient. -Appreciate social worker follow-up for SNF placement as well as providing resources for substance abuse treatment   Diet:HH carb mod IV fluid:None VTE ppx:Sub Q Hep Code status:Full   Dispo: Discharge pending for SND placement. William Gonzalez agrees and prefers to go to SNF. Unfortunately he did not get any bed offer so far likely due to Hx of substance use. I have been in touch with SW and she is following closely. We also asked PT/Ot to evaluate patient again. Although if he has become stronger, HH may be an option, another barrier is, William Gonzalez lived in [place] before coming to the hospital, and per social worker, not all HH agents send their service to the hotels. We and SW will follow up.   William Pretty, MD 05/07/2019, 6:16 AM Pager: [phone]

## 2019-05-07 NOTE — TOC Progression Note (Addendum)
Transition of Care [facility]) - Progression Note    Patient Details  Name: William Gonzalez MRN: [id number] Date of Birth: [birth year]  Transition of Care [facility]) CM/SW Contact  William Gonzalez, [place] Phone Number: 05/07/2019, 5:38 PM  Clinical Narrative:    Patient has no bed offers. CSW contacted [facility] - they will review and f/u with CSW.   CSW contacted patient's son, William Gonzalez at [phone] (CSW was given verbal consent to call son). CSW discussed patient is recommended for SNF placement. CSW also discussed SNF placement barriers. Patient's son states he can not live with any of the family members. They have tried to assist the patient with living arrangements and managing his money but the patient insist on managing his own money and prefers to stay in hotel. He will have to return back to hotel. Patient's son states the family checks on him and make sure he has food, etc while staying in hotel. Patient's son states the patient will allow them to help him until he receives his monthly income, then he stop calling and avoids them- and this is the cycle. Patient's son states he has been trying to reach the patient and but he phone would go straight to voicemail. Patient's son appreciated CSW call and assistance.   CSW visited with the patient to provide update regarding bed offers.Patient was alert and oriented.  Patient [facility] to be [facility]. CSW encouraged the patient explore other options with his family if unable to secure placement at SNF. CSW encourage the patient to continue to work with PT. Patient states he will call his son tonight and will inform CSW of plan B, if needed. CSW advised, CSW will continue to seek placement.    Antony Blackbird, MSW, [facility] Clinical Social Worker [phone]      Expected Discharge Plan: Skilled Nursing Facility Barriers to Discharge: Active Substance Use - Placement, SNF Pending bed offer  Expected Discharge Plan  and Services Expected Discharge Plan: Skilled Nursing Facility   Discharge Planning Services: CM Consult   Living arrangements for the past 2 months: Hotel/Motel                                       Social Determinants of Health (SDOH) Interventions    Readmission Risk Interventions Readmission Risk Prevention Plan 04/29/2019 04/29/2019  Transportation Screening - Complete  Medication Review Oceanographer) Complete Complete  PCP or Specialist appointment within 3-5 days of discharge (No Data) -  HRI or Home Care Consult Complete -  SW Recovery Care/Counseling Consult Complete -  Palliative Care Screening Not Applicable -  Skilled Nursing Facility Not Complete -  Some recent data might be hidden

## 2019-05-07 NOTE — Progress Notes (Signed)
Physical Therapy Treatment Patient Details Name: William Gonzalez MRN: [id number] DOB: 02-02-1956 Today's Date: 05/07/2019    History of Present Illness Pt is a 63 y.o. male admitted 04/28/19 with chest pain and AMS. ECG showed no acute changes. UDS (+) cocaine. Gr + bacteriemia; potentially due to poor dentition. Head CT negative for acute abnormality. MRI with chronic R rotator cuff injury. PMH includes CAD (s/p stenting), polysubstance abuse, stroke, HTN.   PT Comments    Pt slowly progressing with mobility. Able to ambulate 47' with rollator and consistent minA to maintain balance, pt c/o pain rushing back to bed to sit requiring modA to prevent fall; reports limited by chronic bilateral shoulder, back and neck pain. Pt with poor safety awareness and decreased insight into deficits. Limited participation with neck/shoulder ROM therex. Pt at high risk for falls and hospital readmission. Continue to recommend SNF-level therapies to maximize functional mobility and independence. Pt reports if unable to go to SNF, does not have support available and will likely return to a hotel.    Follow Up Recommendations  SNF;Supervision/Assistance - 24 hour     Equipment Recommendations  None recommended by PT    Recommendations for Other Services       Precautions / Restrictions Precautions Precautions: Fall Restrictions Weight Bearing Restrictions: No    Mobility  Bed Mobility Overal bed mobility: Needs Assistance Bed Mobility: Supine to Sit;Sit to Supine     Supine to sit: Supervision Sit to supine: Supervision   General bed mobility comments: Supervision for safety; pt asking for help requiring cues to perform movement himself, reliant on use of bed rail  Transfers Overall transfer level: Needs assistance Equipment used: 4-wheeled walker Transfers: Sit to/from Stand Sit to Stand: Min assist         General transfer comment: Cues to lock rollator brakes and for safe hand  placement. MinA to steady upon standing  Ambulation/Gait Ambulation/Gait assistance: Min assist;Mod assist Gait Distance (Feet): 14 Feet Assistive device: 4-wheeled walker Gait Pattern/deviations: Step-through pattern;Trunk flexed;Decreased stride length;Drifts right/left Gait velocity: Decreased   General Gait Details: Slow, unsteady gait with rollator (required cues to unlock brakes) and consistent minA to maintain balance; pt fatiguing quickly rushing back to bed requiring modA to prevent fall when going to sit. Max cues for safety throughout. Pt reports distance limited by pain   Stairs             Wheelchair Mobility    Modified Rankin (Stroke Patients Only)       Balance Overall balance assessment: Needs assistance Sitting-balance support: No upper extremity supported;Feet supported Sitting balance-Leahy Scale: Fair     Standing balance support: During functional activity;Single extremity supported Standing balance-Leahy Scale: Poor Standing balance comment: relies on UE support                            Cognition Arousal/Alertness: Awake/alert Behavior During Therapy: WFL for tasks assessed/performed Overall Cognitive Status: No family/caregiver present to determine baseline cognitive functioning Area of Impairment: Following commands;Safety/judgement;Problem solving;Awareness;Attention                   Current Attention Level: Selective   Following Commands: Follows one step commands with increased time;Follows multi-step commands inconsistently Safety/Judgement: Decreased awareness of safety;Decreased awareness of deficits Awareness: Emergent Problem Solving: Slow processing;Requires verbal cues        Exercises Other Exercises Other Exercises: Chronic R rotator cuff injury - pt initially only  flexing bilateral shoulders to 90' before dropping arms, after passive ROM to 180' pt actively performing full range but with pain; c/o  chronic neck pain, performed bilateral rotation and lateral flexion cervical stretches    General Comments        Pertinent Vitals/Pain Pain Assessment: Faces Faces Pain Scale: Hurts little more Pain Location: Bilateral shoulders (R>L), neck, back Pain Descriptors / Indicators: Guarding;Discomfort Pain Intervention(s): Limited activity within patient's tolerance;Patient requesting pain meds-RN notified    Home Living                      Prior Function            PT Goals (current goals can now be found in the care plan section) Acute Rehab PT Goals Patient Stated Goal: Rehab at SNF before going back to hotel PT Goal Formulation: With patient Time For Goal Achievement: 05/14/19 Potential to Achieve Goals: Good Progress towards PT goals: Progressing toward goals    Frequency    Min 2X/week      PT Plan Current plan remains appropriate    Co-evaluation              AM-PAC PT "6 Clicks" Mobility   Outcome Measure  Help needed turning from your back to your side while in a flat bed without using bedrails?: None Help needed moving from lying on your back to sitting on the side of a flat bed without using bedrails?: A Little Help needed moving to and from a bed to a chair (including a wheelchair)?: A Little Help needed standing up from a chair using your arms (e.g., wheelchair or bedside chair)?: A Little Help needed to walk in hospital room?: A Lot Help needed climbing 3-5 steps with a railing? : A Lot 6 Click Score: 17    End of Session Equipment Utilized During Treatment: Gait belt Activity Tolerance: Patient tolerated treatment well;Patient limited by pain Patient left: in bed;with call bell/phone within reach;with bed alarm set Nurse Communication: Mobility status;Patient requests pain meds PT Visit Diagnosis: Unsteadiness on feet (R26.81);Muscle weakness (generalized) (M62.81)     Time: [id number] PT Time Calculation (min) (ACUTE ONLY): 18  min  Charges:  $Gait Training: 8-22 mins                     Ina HomesJaclyn Cailin Gebel, PT, DPT Acute Rehabilitation Services  Pager [phone] Office [phone]  Malachy ChamberJaclyn L Asante Ritacco 05/07/2019, 5:14 PM

## 2019-05-07 NOTE — Care Management Important Message (Signed)
Important Message  Patient Details  Name: William Gonzalez MRN: [id number] Date of Birth: [birth year]   Medicare Important Message Given:  Yes    Dorena Bodo 05/07/2019, 11:31 AM

## 2019-05-07 NOTE — Progress Notes (Signed)
  Date: 05/07/2019  Patient name: William Gonzalez  Medical record number: [id number]  Date of birth: 12/22/1956   I have seen and evaluated this patient and I have discussed the plan of care with the house staff. Please see their note for complete details. I concur with their findings with the following additions/corrections:   Doing well, switched to penicillin yesterday for strep group g bacteremia. Shoulder pain continues but much improved with voltaren gel, ketorolac prn. Awaiting SNF placement, all facilities have declined so far. May need to consider home health if no SNF will take him, but given his profound weakness and limited use of his right shoulder, would do much better with SNF.   Resuming home antihypertensives today.   [name], M.D., Ph.D. 05/07/2019, 10:58 AM

## 2019-05-08 DIAGNOSIS — E871 Hypo-osmolality and hyponatremia: Secondary | ICD-10-CM

## 2019-05-08 LAB — GLUCOSE, CAPILLARY
Glucose-Capillary: 136 mg/dL — ABNORMAL HIGH (ref 70–99)
Glucose-Capillary: 219 mg/dL — ABNORMAL HIGH (ref 70–99)
Glucose-Capillary: 114 mg/dL — ABNORMAL HIGH (ref 70–99)
Glucose-Capillary: 167 mg/dL — ABNORMAL HIGH (ref 70–99)

## 2019-05-08 LAB — CBC WITH DIFFERENTIAL/PLATELET
Abs Immature Granulocytes: 0.22 10*3/uL — ABNORMAL HIGH (ref 0.00–0.07)
Basophils Absolute: 0.1 10*3/uL (ref 0.0–0.1)
Basophils Relative: 0 %
Eosinophils Absolute: 0.3 10*3/uL (ref 0.0–0.5)
Eosinophils Relative: 1 %
HCT: 29 % — ABNORMAL LOW (ref 39.0–52.0)
Hemoglobin: 9.8 g/dL — ABNORMAL LOW (ref 13.0–17.0)
Immature Granulocytes: 1 %
Lymphocytes Relative: 10 %
Lymphs Abs: 2.3 10*3/uL (ref 0.7–4.0)
MCH: 32.6 pg (ref 26.0–34.0)
MCHC: 33.8 g/dL (ref 30.0–36.0)
MCV: 96.3 fL (ref 80.0–100.0)
Monocytes Absolute: 1.6 10*3/uL — ABNORMAL HIGH (ref 0.1–1.0)
Monocytes Relative: 7 %
Neutro Abs: 18.6 10*3/uL — ABNORMAL HIGH (ref 1.7–7.7)
Neutrophils Relative %: 81 %
Platelets: 363 10*3/uL (ref 150–400)
RBC: 3.01 MIL/uL — ABNORMAL LOW (ref 4.22–5.81)
RDW: 12.1 % (ref 11.5–15.5)
WBC: 23 10*3/uL — ABNORMAL HIGH (ref 4.0–10.5)
nRBC: 0 % (ref 0.0–0.2)

## 2019-05-08 LAB — BASIC METABOLIC PANEL
Anion gap: 10 (ref 5–15)
BUN: 8 mg/dL (ref 8–23)
CO2: 24 mmol/L (ref 22–32)
Calcium: 8.5 mg/dL — ABNORMAL LOW (ref 8.9–10.3)
Chloride: 99 mmol/L (ref 98–111)
Creatinine, Ser: 0.89 mg/dL (ref 0.61–1.24)
GFR calc Af Amer: >60 mL/min (ref >60)
GFR calc non Af Amer: >60 mL/min (ref >60)
Glucose, Bld: 210 mg/dL — ABNORMAL HIGH (ref 70–99)
Potassium: 4.5 mmol/L (ref 3.5–5.1)
Sodium: 133 mmol/L — ABNORMAL LOW (ref 135–145)

## 2019-05-08 MED ORDER — INSULIN ASPART 100 UNIT/ML ~~LOC~~ SOLN
2.0000 [IU] | Freq: Three times a day (TID) | SUBCUTANEOUS | Status: DC
  Administered 2019-05-08 – 2019-05-11 (×9): 2 [IU] via SUBCUTANEOUS

## 2019-05-08 MED ORDER — NAPROXEN 250 MG PO TABS
250.0000 mg | ORAL_TABLET | Freq: Two times a day (BID) | ORAL | Status: DC | PRN
  Administered 2019-05-08 – 2019-05-11 (×8): 250 mg via ORAL
  Filled 2019-05-08 (×11): qty 1

## 2019-05-08 NOTE — Progress Notes (Signed)
  Date: 05/08/2019  Patient name: William Gonzalez  Medical record number: [id number]  Date of birth: 09-Aug-1956   I have seen and evaluated this patient and I have discussed the plan of care with the house staff. Please see their note for complete details. I concur with their findings with the following additions/corrections:   Doing well on penicillin for streptococcal group G bacteremia, although leukocytosis has crept up again today.  Afebrile, will continue IV penicillin for now.  If he becomes febrile or leukocytosis worsens, could reculture and consider broadening antibiotics or evaluating for endocarditis or other source that is not controlled.  Shoulder pain worse again today.  Working with PT and OT, but is at high risk for falls and other functional decline, will hopefully find SNF placement for him.  When he has a bed available, we should be able to transition to amoxicillin to complete his course of 2 weeks of antibiotics.  [name], M.D., Ph.D. 05/08/2019, 3:24 PM

## 2019-05-08 NOTE — Progress Notes (Addendum)
Subjective:  No acute events overnight. He still has some soulder pain. No other complaint. He was able to walk with physical therapist briefly but had to stopped due to shoulder pain.   Objective:  Vital signs in last 24 hours: Vitals:   05/07/19 2300 05/08/19 0300 05/08/19 0725 05/08/19 1040  BP: (!) 160/65 (!) 169/57 (!) 175/72 (!) 155/65  Pulse: (!) 56 (!) 54    Resp: (!) 26 (!) 23 17 19   Temp: 97.9 F (36.6 C) (!) 97.5 F (36.4 C) 98.1 F (36.7 C) 98.1 F (36.7 C)  TempSrc: Axillary Oral Oral Oral  SpO2: 98% 99%    Weight:      Height:       Physicalexam: General:Lying in bed in no acute distress CV:RRR, nl S1S2, systolic murmur Pulmonary exam: No respiratory distress, no crackles,nowheezing. Abdomen:Soft and nontender to palpation,BS are present Musculoskeletal:Extremities are normal appearing. No edema.  Neurlogy exam: He is alert and oriented x 3.   Assessment/Plan:  Principal Problem:   Streptococcal bacteremia Active Problems:   Coronary artery disease   Hypertension   Tobacco abuse   Alcohol abuse   Diabetes mellitus without complication (HCC)   Cocaine abuse (HCC)   Suicidal ideation   Chest pain   Atrial fibrillation (HCC)   Substance induced mood disorder (HCC)  63 year old male with past medical history of CAD status post stent, DM HTN, history of CVA,alcohol and cocaine use, smoking, MDD came to the emergency department today due toworsening of intermittentchest painin setting of chest pain.He is currently treated for streptococcal bacteremia.  Streptococcus Gbacteriemia: Withno certain known source. Remained asymptomatic, afebriel and clinically stable, how ever WBC backed up to 20000 today.  Repeated BC has been negative. He has been on IV Penicillin (started 5/13).  (Started on Ceftriaxon 05/04/2019, switched to Penicillin G, 05/06/2019.)  Medically stable for DC to SNF. Will switch Penicillin to PO Amoxicilin when get a bed at  SNF and will continue for 5 days    -Continue Penicillin G while in hospital and until SNF placemnet -Will DC with PO Amoxicillin  -No new IV line and no PICC line insertion -CBC daily -Awaiting SNF placement.  Hyponatremia: Mild, Na 133 today. Serum osm 280, (borderline. Isotonic vs hypotonic) Can be pseudo hyponatremia in setting of Pr uria, vs euvolemic hypotonic hyponatremia. Will monitor. If sustains, will do fluid restriction. -BMP daily  HTN: Resumed home meds yesterday. Was still hypertensive overnight at 160s, 170s. Now improved to 150s/60s. Asymptomatic. Will monitor and ir remains hypertensive will increase the treatment  -Continue Amlodipine 10 mg daily -Continue lisinopril 20 mg daily -Continue carvedilol 25 mg twice daily.   Rt rotator cuff injury: Pain is better but still present Orthopedicsreviewed MRI and recommended outpatient referral as needed and PT  -DC Toradol  -Naproxen 500 mg BID PRN  NSAID) -ContinueTylenoland Voltaren gelPRN  -PT/OT eval and treat  AKI: Resolved with IV F. Cr today 0.89 Was likely prerenal due to poor po fluid intake.  -BMP daily Chest pain in setting of cocaine use :Chest painresolved.Medically managed.  (UDS waspositive for cocaine. No evidence ofacuteischemia. Stress testperformed and was low risk per cardiology.No further work up at this point.)  -Continue home ASA 80 mg QD and Plavix 75 mg QD -Continue home Lipitor  -BMPdaily  A-fibwith RVR: Remained sinus.   -Continue carvedilol 25 mg twice daily -Cardiac monitoring  Normocytic Anemia:Chronic IDA.Hbisstable.  -Continuehome ferrous sulfate  DM II:On Metfromin 1000mg  BID at home.Will keep current  dose of Insulin as no consistent elevated BG   -ContinueSensitive SSI  -CBG monitoring  Alcohol use: No withdrawal.  Dispo: Discharge pending SNF placement.  Chevis PrettyMasoudi, Elisabetta Mishra, MD 05/08/2019, 12:55 PM Pager: [phone]

## 2019-05-08 NOTE — Evaluation (Signed)
Occupational Therapy Evaluation Patient Details Name: William Gonzalez MRN: [id number] DOB: [birth year] Today's Date: 05/08/2019    History of Present Illness Pt is a 63 y.o. male admitted 04/28/19 with chest pain and AMS. ECG showed no acute changes. UDS (+) cocaine. Gr + bacteriemia; potentially due to poor dentition. Head CT negative for acute abnormality. MRI with chronic R rotator cuff injury. PMH includes CAD (s/p stenting), polysubstance abuse, stroke, HTN.   Clinical Impression   Pt reports he leads a sedentary lifestyle. He walks with a rollator, has assistance for bathing his back and dresses himself at baseline. Pt presents with generalized weakness and poor balance. Educated pt in gentle strengthening activities for shoulders. Pt does not appear likely to follow through with exercising on his own, but is cooperative with therapy. Recommending SNF.    Follow Up Recommendations  SNF;Supervision/Assistance - 24 hour    Equipment Recommendations  None recommended by OT    Recommendations for Other Services       Precautions / Restrictions Precautions Precautions: Fall Restrictions Weight Bearing Restrictions: No      Mobility Bed Mobility Overal bed mobility: Needs Assistance Bed Mobility: Supine to Sit;Sit to Supine     Supine to sit: Supervision Sit to supine: Supervision   General bed mobility comments: supervision with rail, HOB up 30 degrees, pt reaching for assist, but did not require  Transfers Overall transfer level: Needs assistance Equipment used: 4-wheeled walker Transfers: Sit to/from Stand Sit to Stand: Min assist         General transfer comment: cues for safety, assist to rise and steady    Balance Overall balance assessment: Needs assistance   Sitting balance-Leahy Scale: Fair     Standing balance support: During functional activity;Single extremity supported Standing balance-Leahy Scale: Poor                             ADL  either performed or assessed with clinical judgement   ADL Overall ADL's : Needs assistance/impaired Eating/Feeding: Independent;Sitting   Grooming: Wash/dry hands;Wash/dry face;Standing;Minimal assistance   Upper Body Bathing: Minimal assistance;Sitting   Lower Body Bathing: Minimal assistance;Sit to/from stand   Upper Body Dressing : Minimal assistance;Sitting   Lower Body Dressing: Minimal assistance;Sit to/from stand   Toilet Transfer: Minimal assistance;Ambulation;RW;BSC   Toileting- Clothing Manipulation and Hygiene: Minimal assistance;Sit to/from stand       Functional mobility during ADLs: Minimal assistance;Rolling walker       Vision Patient Visual Report: No change from baseline       Perception     Praxis      Pertinent Vitals/Pain Pain Assessment: No/denies pain     Hand Dominance Right   Extremity/Trunk Assessment Upper Extremity Assessment Upper Extremity Assessment: Generalized weakness(Full AROM, strength in shoulders 3/5)   Lower Extremity Assessment Lower Extremity Assessment: Defer to PT evaluation   Cervical / Trunk Assessment Cervical / Trunk Assessment: Kyphotic   Communication Communication Communication: No difficulties   Cognition Arousal/Alertness: Awake/alert Behavior During Therapy: WFL for tasks assessed/performed Overall Cognitive Status: No family/caregiver present to determine baseline cognitive functioning Area of Impairment: Following commands;Safety/judgement;Problem solving;Awareness;Attention                   Current Attention Level: Selective   Following Commands: Follows one step commands with increased time;Follows multi-step commands inconsistently Safety/Judgement: Decreased awareness of safety;Decreased awareness of deficits Awareness: Emergent Problem Solving: Slow processing;Requires verbal cues General Comments: pt's chief  concern is his UE weakness   General Comments       Exercises Exercises:  General Upper Extremity General Exercises - Upper Extremity Shoulder Flexion: Strengthening;Both;10 reps;Supine;Bar weights/barbell Bar Weights/Barbell (Shoulder Flexion): 1 lb Shoulder Horizontal ABduction: Strengthening;Both;10 reps;Supine;Theraband Theraband Level (Shoulder Horizontal Abduction): Level 1 (Yellow) Other Exercises Other Exercises: ER x 10 B UEs with level 1 tband   Shoulder Instructions      Home Living Family/patient expects to be discharged to:: Skilled nursing facility Living Arrangements: Alone Available Help at Discharge: Friend(s);Available 24 hours/day Type of Home: Other(Comment)(motel) Home Access: Level entry     Home Layout: One level     Bathroom Shower/Tub: Chief Strategy OfficerTub/shower unit   Bathroom Toilet: Standard     Home Equipment: Environmental consultantWalker - 4 wheels          Prior Functioning/Environment Level of Independence: Needs assistance  Gait / Transfers Assistance Needed: used rollator but reports he stayed in bed alot since his last rehab stay last year ADL's / Homemaking Assistance Needed: "lady friend" helped him wash his back, uses slip on shoes due to difficulty tying   Comments: does not drive, reports several falls        OT Problem List: Decreased strength;Decreased activity tolerance;Impaired balance (sitting and/or standing);Decreased cognition;Decreased safety awareness;Decreased knowledge of use of DME or AE;Impaired UE functional use      OT Treatment/Interventions: Self-care/ADL training;DME and/or AE instruction;Patient/family education;Therapeutic activities;Balance training;Therapeutic exercise    OT Goals(Current goals can be found in the care plan section) Acute Rehab OT Goals Patient Stated Goal: Rehab at SNF before going back to hotel OT Goal Formulation: With patient Time For Goal Achievement: 05/22/19 Potential to Achieve Goals: Good ADL Goals Pt Will Perform Grooming: with supervision;standing Pt Will Perform Upper Body Bathing:  with supervision;with adaptive equipment;sitting Pt Will Perform Lower Body Bathing: with supervision;sit to/from stand Pt Will Perform Upper Body Dressing: with set-up;with supervision;sitting Pt Will Perform Lower Body Dressing: with set-up;with supervision;sit to/from stand Pt Will Transfer to Toilet: with supervision;ambulating Pt Will Perform Toileting - Clothing Manipulation and hygiene: with supervision;sit to/from stand Pt/caregiver will Perform Home Exercise Program: Increased strength;Both right and left upper extremity;With theraband;Independently;With written HEP provided  OT Frequency: Min 2X/week   Barriers to D/C:            Co-evaluation              AM-PAC OT "6 Clicks" Daily Activity     Outcome Measure Help from another person eating meals?: None Help from another person taking care of personal grooming?: A Little Help from another person toileting, which includes using toliet, bedpan, or urinal?: A Little Help from another person bathing (including washing, rinsing, drying)?: A Little Help from another person to put on and taking off regular upper body clothing?: A Little Help from another person to put on and taking off regular lower body clothing?: A Little 6 Click Score: 19   End of Session Equipment Utilized During Treatment: Gait belt;Rolling walker  Activity Tolerance: Patient tolerated treatment well Patient left: in bed;with call bell/phone within reach;with bed alarm set  OT Visit Diagnosis: Other abnormalities of gait and mobility (R26.89);Unsteadiness on feet (R26.81);History of falling (Z91.81);Muscle weakness (generalized) (M62.81);Other symptoms and signs involving cognitive function                Time: [id number] OT Time Calculation (min): 33 min Charges:  OT General Charges $OT Visit: 1 Visit OT Evaluation $OT Eval Moderate Complexity: 1 Mod OT Treatments $  Neuromuscular Re-education: 8-22 mins  William Gonzalez, OTR/L Acute  Rehabilitation Services Pager: [phone] Office: [phone]  William Gonzalez 05/08/2019, 11:00 AM

## 2019-05-08 NOTE — TOC Progression Note (Signed)
Transition of Care [facility]) - Progression Note    Patient Details  Name: [name] MRN: [id number] Date of Birth: Sep 01, 1956  Transition of Care [facility]) CM/SW Contact  Eduard Roux, [place] Phone Number: 05/08/2019, 2:46 PM  Clinical Narrative:    Patient PSARR Reveiw still pending for SNF  Patient has accepted bed offer at [facility]. CSW faxed admissions paperwork completed and signed by the patient today. CSW faxed covid-19 test results to SNF. SNF will be able to accept patient 05/11/2019, if, he is medically stable. Patient express appreciation for CSW assistance. CSW called and updated the patient's son on SNF placement.   Antony Blackbird, MSW, [facility] Clinical Social Worker [phone]    Expected Discharge Plan: Skilled Nursing Facility Barriers to Discharge: Active Substance Use - Placement, SNF Pending bed offer  Expected Discharge Plan and Services Expected Discharge Plan: Skilled Nursing Facility   Discharge Planning Services: CM Consult   Living arrangements for the past 2 months: Hotel/Motel                                       Social Determinants of Health (SDOH) Interventions    Readmission Risk Interventions Readmission Risk Prevention Plan 04/29/2019 04/29/2019  Transportation Screening - Complete  Medication Review Oceanographer) Complete Complete  PCP or Specialist appointment within 3-5 days of discharge (No Data) -  HRI or Home Care Consult Complete -  SW Recovery Care/Counseling Consult Complete -  Palliative Care Screening Not Applicable -  Skilled Nursing Facility Not Complete -  Some recent data might be hidden

## 2019-05-09 DIAGNOSIS — Z8673 Personal history of transient ischemic attack (TIA), and cerebral infarction without residual deficits: Secondary | ICD-10-CM

## 2019-05-09 LAB — BASIC METABOLIC PANEL
Anion gap: 11 (ref 5–15)
BUN: 11 mg/dL (ref 8–23)
CO2: 21 mmol/L — ABNORMAL LOW (ref 22–32)
Calcium: 8.8 mg/dL — ABNORMAL LOW (ref 8.9–10.3)
Chloride: 99 mmol/L (ref 98–111)
Creatinine, Ser: 0.85 mg/dL (ref 0.61–1.24)
GFR calc Af Amer: >60 mL/min (ref >60)
GFR calc non Af Amer: >60 mL/min (ref >60)
Glucose, Bld: 190 mg/dL — ABNORMAL HIGH (ref 70–99)
Potassium: 4.3 mmol/L (ref 3.5–5.1)
Sodium: 131 mmol/L — ABNORMAL LOW (ref 135–145)

## 2019-05-09 LAB — CBC WITH DIFFERENTIAL/PLATELET
Abs Immature Granulocytes: 0.28 10*3/uL — ABNORMAL HIGH (ref 0.00–0.07)
Basophils Absolute: 0.1 10*3/uL (ref 0.0–0.1)
Basophils Relative: 0 %
Eosinophils Absolute: 0.3 10*3/uL (ref 0.0–0.5)
Eosinophils Relative: 1 %
HCT: 29.4 % — ABNORMAL LOW (ref 39.0–52.0)
Hemoglobin: 10.2 g/dL — ABNORMAL LOW (ref 13.0–17.0)
Immature Granulocytes: 1 %
Lymphocytes Relative: 10 %
Lymphs Abs: 2.3 10*3/uL (ref 0.7–4.0)
MCH: 32.7 pg (ref 26.0–34.0)
MCHC: 34.7 g/dL (ref 30.0–36.0)
MCV: 94.2 fL (ref 80.0–100.0)
Monocytes Absolute: 1.3 10*3/uL — ABNORMAL HIGH (ref 0.1–1.0)
Monocytes Relative: 5 %
Neutro Abs: 20.2 10*3/uL — ABNORMAL HIGH (ref 1.7–7.7)
Neutrophils Relative %: 83 %
Platelets: 410 10*3/uL — ABNORMAL HIGH (ref 150–400)
RBC: 3.12 MIL/uL — ABNORMAL LOW (ref 4.22–5.81)
RDW: 12 % (ref 11.5–15.5)
WBC: 24.4 10*3/uL — ABNORMAL HIGH (ref 4.0–10.5)
nRBC: 0 % (ref 0.0–0.2)

## 2019-05-09 LAB — GLUCOSE, CAPILLARY
Glucose-Capillary: 207 mg/dL — ABNORMAL HIGH (ref 70–99)
Glucose-Capillary: 281 mg/dL — ABNORMAL HIGH (ref 70–99)
Glucose-Capillary: 353 mg/dL — ABNORMAL HIGH (ref 70–99)
Glucose-Capillary: 171 mg/dL — ABNORMAL HIGH (ref 70–99)

## 2019-05-09 MED ORDER — PANTOPRAZOLE SODIUM 40 MG PO TBEC
40.0000 mg | DELAYED_RELEASE_TABLET | Freq: Two times a day (BID) | ORAL | Status: DC
  Administered 2019-05-09 – 2019-05-11 (×5): 40 mg via ORAL
  Filled 2019-05-09 (×3): qty 1

## 2019-05-09 MED ORDER — ENOXAPARIN SODIUM 40 MG/0.4ML ~~LOC~~ SOLN
40.0000 mg | SUBCUTANEOUS | Status: DC
  Administered 2019-05-09 – 2019-05-10 (×2): 40 mg via SUBCUTANEOUS
  Filled 2019-05-09 (×2): qty 0.4

## 2019-05-09 NOTE — Progress Notes (Signed)
Internal Medicine Teaching Service Attending:   I saw and examined the patient. I reviewed the resident's note and I agree with the resident's findings and plan as documented in the resident's note.  Principal Problem:   Streptococcal bacteremia Active Problems:   Coronary artery disease   Hypertension   Tobacco abuse   Alcohol abuse   Diabetes mellitus without complication (HCC)   Cocaine abuse (HCC)   Suicidal ideation   Chest pain   Atrial fibrillation (HCC)   Substance induced mood disorder (HCC)  63 year old person hospital day #11 with strep G bacteremia being treated with IV penicillin for a 14-day course.  Clinically looks well today with minimal complaints, has been out of bed to the chair already this morning.  He has a leukocytosis which is neutrophil predominance that is a outlier in his overall picture.  I agree with repeating blood cultures but would continue with the same antibiotics for now.  He has no focal pain in his mouth or his abdomen to suggest the need for imaging right now.  Continuing to work on placement with a skilled nursing facility for outpatient rehab as he requires consistence minimal assist to maintain balance while walking, he has poor safety awareness, decreased insight into his deficits, and would benefit from maximizing functional mobility and independence.  Erlinda Hong, MD FACP

## 2019-05-09 NOTE — Progress Notes (Signed)
   Subjective:  No fevers or chills over night. Continues to have intermittent right shoulder pain and neck pain but says he is otherwise doing well.    Objective:  Vital signs in last 24 hours: Vitals:   05/09/19 0000 05/09/19 0300 05/09/19 0400 05/09/19 0800  BP: (!) 150/68 (!) 167/71  (!) 160/86  Pulse:  (!) 59  65  Resp: 19 (!) 24 15 20   Temp:  98 F (36.7 C)  (!) 97.5 F (36.4 C)  TempSrc:  Oral  Oral  SpO2:  98%  97%  Weight:      Height:       Cardiac: irregularly irregular with increased rate, rest of exam difficult due to rate Pulmonary: CTAB, not in distress Abdominal: non distended abdomen, soft and nontender Extremities: no LE edema Psych: Alert, conversant, in good spirits   Assessment/Plan:  Principal Problem:   Streptococcal bacteremia Active Problems:   Coronary artery disease   Hypertension   Tobacco abuse   Alcohol abuse   Diabetes mellitus without complication (HCC)   Cocaine abuse (HCC)   Suicidal ideation   Chest pain   Atrial fibrillation (HCC)   Substance induced mood disorder (HCC)  63 year old male with past medical history of CAD status post stent, DM HTN, history of CVA,alcohol and cocaine use, smoking, MDD came to the emergency department today due toworsening of intermittentchest painin setting of chest pain.He is currently treated for streptococcal bacteremia.  Streptococcus Gbacteriemia: With unknown source.  Found to be febrile with leukocytosis during his stay. Started on Ceftriaxone 05/04/2019, switched to Penicillin G, 05/06/2019. Leukocytosis has returned after switching to penicillin.  -Continue Penicillin G while in hospital and until SNF placemnet -Repeat blood cultures today to ensure continued clearance -Will DC with PO Amoxicillin  -Awaiting SNF placement.  HTN: Resumed home meds 5/14 as pt became hypertensive.  Improving slowly had to d/c amlodipine so we can use cardizem if needed.  Few other good options for the  patient with his comorbidities.   -Continue lisinopril 20 mg daily -Continue carvedilol 25 mg twice daily.  Rt rotator cuff injury: Pain is better but still present Orthopedicsreviewed MRI and recommended outpatient referral as needed and PT   -Naproxen 250 mg BID PRN -ContinueTylenoland Voltaren gelPRN  -PT/OT   Chest pain in setting of cocaine use :Chest painresolved.Medically managed.  (UDS waspositive for cocaine. No evidence ofacuteischemia. Stress testperformed and was low risk per cardiology.No further work up at this point.)  -Continue Plavix 75 mg QD -Continue home Lipitor  -BMPdaily  A-fibwith DXI:[id number] back to afib 5/16.  Rec'd dose of amlodipine this morning, therefore cannot add back diltiazem   -Continue carvedilol 25 mg twice daily -Cardiac monitoring -may need to add back diltiazem tomorrow if rate does not improve  Normocytic Anemia:Chronic IDA.Hbisstable.  -Continuehome ferrous sulfate  DM II:On Metfromin 1000mg  BID at home. Averages about 6-7 units of insulin per day  -increase mealtime insulin to 3, continue SSI -CBG monitoring  Alcohol use: Had no withdrawal here  -continue to encourage cessation   Dispo: Discharge pending SNF placement.  Angelita Ingles, MD 05/09/2019, 11:27 AM

## 2019-05-10 LAB — CBC WITH DIFFERENTIAL/PLATELET
Abs Immature Granulocytes: 0.59 10*3/uL — ABNORMAL HIGH (ref 0.00–0.07)
Basophils Absolute: 0.1 10*3/uL (ref 0.0–0.1)
Basophils Relative: 0 %
Eosinophils Absolute: 0.3 10*3/uL (ref 0.0–0.5)
Eosinophils Relative: 1 %
HCT: 30.6 % — ABNORMAL LOW (ref 39.0–52.0)
Hemoglobin: 10.6 g/dL — ABNORMAL LOW (ref 13.0–17.0)
Immature Granulocytes: 3 %
Lymphocytes Relative: 12 %
Lymphs Abs: 2.8 10*3/uL (ref 0.7–4.0)
MCH: 32.6 pg (ref 26.0–34.0)
MCHC: 34.6 g/dL (ref 30.0–36.0)
MCV: 94.2 fL (ref 80.0–100.0)
Monocytes Absolute: 1.4 10*3/uL — ABNORMAL HIGH (ref 0.1–1.0)
Monocytes Relative: 6 %
Neutro Abs: 18.4 10*3/uL — ABNORMAL HIGH (ref 1.7–7.7)
Neutrophils Relative %: 78 %
Platelets: 484 10*3/uL — ABNORMAL HIGH (ref 150–400)
RBC: 3.25 MIL/uL — ABNORMAL LOW (ref 4.22–5.81)
RDW: 12.3 % (ref 11.5–15.5)
WBC: 23.5 10*3/uL — ABNORMAL HIGH (ref 4.0–10.5)
nRBC: 0 % (ref 0.0–0.2)

## 2019-05-10 LAB — GLUCOSE, CAPILLARY
Glucose-Capillary: 188 mg/dL — ABNORMAL HIGH (ref 70–99)
Glucose-Capillary: 274 mg/dL — ABNORMAL HIGH (ref 70–99)
Glucose-Capillary: 166 mg/dL — ABNORMAL HIGH (ref 70–99)
Glucose-Capillary: 121 mg/dL — ABNORMAL HIGH (ref 70–99)

## 2019-05-10 LAB — BASIC METABOLIC PANEL
Anion gap: 13 (ref 5–15)
BUN: 14 mg/dL (ref 8–23)
CO2: 21 mmol/L — ABNORMAL LOW (ref 22–32)
Calcium: 9.2 mg/dL (ref 8.9–10.3)
Chloride: 98 mmol/L (ref 98–111)
Creatinine, Ser: 1.02 mg/dL (ref 0.61–1.24)
GFR calc Af Amer: >60 mL/min (ref >60)
GFR calc non Af Amer: >60 mL/min (ref >60)
Glucose, Bld: 199 mg/dL — ABNORMAL HIGH (ref 70–99)
Potassium: 4.4 mmol/L (ref 3.5–5.1)
Sodium: 132 mmol/L — ABNORMAL LOW (ref 135–145)

## 2019-05-10 LAB — CULTURE, BLOOD (ROUTINE X 2)
Culture: NO GROWTH
Culture: NO GROWTH
Special Requests: ADEQUATE
Special Requests: ADEQUATE

## 2019-05-10 NOTE — Progress Notes (Signed)
   Subjective: Mr. Corkran is doing well today. His neck and shoulder pain improved. Denies any chest pain, shortness of breath, palpitation.  Objective:  Vital signs in last 24 hours: Vitals:   05/09/19 2300 05/10/19 0000 05/10/19 0300 05/10/19 0400  BP: (!) 132/98  113/78   Pulse: 94  (!) 113   Resp: (!) 27 (!) 24 (!) 24 (!) 27  Temp: 98.2 F (36.8 C)  97.6 F (36.4 C)   TempSrc: Axillary  Axillary   SpO2: 99%  98%   Weight:      Height:       Physicalexam: General:Lying in bed in no acute distress CV:RRR, nl S1S2 Pulmonary exam: No respiratory distress, no crackles,nowheezing. Abdomen:Soft and nontender to palpation,BS are present Musculoskeletal:Extremities are normal appearing. No edema, no shoulder tenderness today Neurlogy exam: He is alert and oriented x 3.   Assessment/Plan:  Principal Problem:   Streptococcal bacteremia Active Problems:   Coronary artery disease   Hypertension   Tobacco abuse   Alcohol abuse   Diabetes mellitus without complication (HCC)   Cocaine abuse (HCC)   Suicidal ideation   Chest pain   Atrial fibrillation (HCC)   Substance induced mood disorder (HCC)  63 year old male with past medical history of CAD status post stent, DM HTN, history of CVA,alcohol and cocaine use, smoking, MDD came to the emergency department today due toworsening of intermittentchest painin setting of chest pain.He is currently treated for streptococcal bacteremia.  Streptococcus Gbacteriemia: With unknown source.  Found to be febrile with leukocytosis during his stay. Startedon Ceftriaxone5/10/2019, switched to Penicillin G, 05/06/2019. Leukocytosis has returned after switching to penicillin.  Afebrile and asymptomatic but still has leukocytosis. Repeat BC result is pending.  -ContinuePenicillinG while in hospital and until SNF placemnet -F/u blood cultures  -Will DC with PO Amoxicillin  -Awaiting SNF placement.  ERX:[id number]  today Resumed home meds 5/14 as pt became hypertensive.   had to d/c amlodipine so we can use cardizem if needed.  Few other good options for the patient with his comorbidities.   -Continue lisinopril 20 mg daily -Continuecarvedilol 25 mg twice daily. -Keep holding Amlodipine  Rt rotator cuff injury: Pain improved Orthopedicsreviewed MRI and recommended outpatient referral as needed and PT  -Naproxen 250 mg BID PRN -ContinueTylenoland Voltaren gelPRN -PT/OT   Chest pain in setting of cocaine use :Chest painresolved.Medically managed.  (UDS waspositive for cocaine. No evidence ofacuteischemia. Stress testperformed and was low risk per cardiology.No further work up at this point.)  -Continue Plavix 75 mg QD -Continue home Lipitor  -BMPdaily  A-fibwith DTO:[id number] back to afib 5/16.   Amlodipine held yesterday in the case he needed Diltiazem. He had some episodes of Afib with RVR at 130-140s but his rate came down to 80s now. Will start low dose of Diltiazem if RVR happens again.  -Continue Carvedilol 25 mg twice daily -Cardiac monitoring -May need to add back diltiazem if rate does not improve  Normocytic Anemia:Chronic IDA.Hbisstable.  -Continuehome ferrous sulfate  DM II:On Metfromin 1000mg  BID at home. Averages about 6-7 units of insulin per day  -Continue 3 units of mealtime insulin to 3 -Continue SSI -CBG monitoring  Alcohol use: Had no withdrawal here  -continue to encourage cessation   Dispo: Discharge pending SNF placement.   Chevis Pretty, MD 05/10/2019, 6:28 AM Pager: [phone]

## 2019-05-11 LAB — CBC WITH DIFFERENTIAL/PLATELET
Abs Immature Granulocytes: 0.29 10*3/uL — ABNORMAL HIGH (ref 0.00–0.07)
Basophils Absolute: 0.1 10*3/uL (ref 0.0–0.1)
Basophils Relative: 0 %
Eosinophils Absolute: 0.3 10*3/uL (ref 0.0–0.5)
Eosinophils Relative: 1 %
HCT: 29.7 % — ABNORMAL LOW (ref 39.0–52.0)
Hemoglobin: 10.3 g/dL — ABNORMAL LOW (ref 13.0–17.0)
Immature Granulocytes: 2 %
Lymphocytes Relative: 12 %
Lymphs Abs: 2.3 10*3/uL (ref 0.7–4.0)
MCH: 33 pg (ref 26.0–34.0)
MCHC: 34.7 g/dL (ref 30.0–36.0)
MCV: 95.2 fL (ref 80.0–100.0)
Monocytes Absolute: 1.2 10*3/uL — ABNORMAL HIGH (ref 0.1–1.0)
Monocytes Relative: 6 %
Neutro Abs: 15.4 10*3/uL — ABNORMAL HIGH (ref 1.7–7.7)
Neutrophils Relative %: 79 %
Platelets: 519 10*3/uL — ABNORMAL HIGH (ref 150–400)
RBC: 3.12 MIL/uL — ABNORMAL LOW (ref 4.22–5.81)
RDW: 12.6 % (ref 11.5–15.5)
WBC: 19.5 10*3/uL — ABNORMAL HIGH (ref 4.0–10.5)
nRBC: 0 % (ref 0.0–0.2)

## 2019-05-11 LAB — BASIC METABOLIC PANEL
Anion gap: 13 (ref 5–15)
BUN: 20 mg/dL (ref 8–23)
CO2: 20 mmol/L — ABNORMAL LOW (ref 22–32)
Calcium: 9 mg/dL (ref 8.9–10.3)
Chloride: 99 mmol/L (ref 98–111)
Creatinine, Ser: 1.28 mg/dL — ABNORMAL HIGH (ref 0.61–1.24)
GFR calc Af Amer: >60 mL/min (ref >60)
GFR calc non Af Amer: 58 mL/min — ABNORMAL LOW (ref >60)
Glucose, Bld: 189 mg/dL — ABNORMAL HIGH (ref 70–99)
Potassium: 4.6 mmol/L (ref 3.5–5.1)
Sodium: 132 mmol/L — ABNORMAL LOW (ref 135–145)

## 2019-05-11 LAB — GLUCOSE, CAPILLARY
Glucose-Capillary: 191 mg/dL — ABNORMAL HIGH (ref 70–99)
Glucose-Capillary: 199 mg/dL — ABNORMAL HIGH (ref 70–99)
Glucose-Capillary: 153 mg/dL — ABNORMAL HIGH (ref 70–99)

## 2019-05-11 MED ORDER — SODIUM CHLORIDE 0.9 % IV BOLUS
500.0000 mL | Freq: Once | INTRAVENOUS | Status: AC
  Administered 2019-05-11: 10:00:00 500 mL via INTRAVENOUS

## 2019-05-11 MED ORDER — DICLOFENAC SODIUM 1 % TD GEL: 2.0000 g | Freq: Three times a day (TID) | TRANSDERMAL | 1 refills | Status: AC | PRN

## 2019-05-11 MED ORDER — DILTIAZEM HCL ER 60 MG PO CP12: 60.0000 mg | ORAL_CAPSULE | Freq: Two times a day (BID) | ORAL | 0 refills | Status: AC

## 2019-05-11 MED ORDER — AMOXICILLIN 500 MG PO CAPS: 500.0000 mg | ORAL_CAPSULE | Freq: Three times a day (TID) | ORAL | 0 refills | Status: AC

## 2019-05-11 MED ORDER — RAMELTEON 8 MG PO TABS: 8.0000 mg | ORAL_TABLET | Freq: Every day | ORAL | 0 refills | Status: AC

## 2019-05-11 MED ORDER — DILTIAZEM HCL 60 MG PO TABS
30.0000 mg | ORAL_TABLET | Freq: Four times a day (QID) | ORAL | Status: DC
  Administered 2019-05-11 (×2): 30 mg via ORAL
  Filled 2019-05-11 (×2): qty 1

## 2019-05-11 MED ORDER — AMOXICILLIN 500 MG PO CAPS
500.0000 mg | ORAL_CAPSULE | Freq: Three times a day (TID) | ORAL | Status: DC
  Administered 2019-05-11: 500 mg via ORAL
  Filled 2019-05-11 (×3): qty 1

## 2019-05-11 NOTE — Care Management Important Message (Signed)
Important Message  Patient Details  Name: William Gonzalez MRN: [id number] Date of Birth: Aug 15, 1956   Medicare Important Message Given:  Yes    Dorena Bodo 05/11/2019, 2:47 PM

## 2019-05-11 NOTE — Progress Notes (Signed)
Physical Therapy Treatment Patient Details Name: William Gonzalez MRN: [id number] DOB: 14-Jan-1956 Today's Date: 05/11/2019    History of Present Illness Pt is a 63 y.o. male admitted 04/28/19 with chest pain and AMS. ECG showed no acute changes. UDS (+) cocaine. Gr + bacteriemia; potentially due to poor dentition. Head CT negative for acute abnormality. MRI with chronic R rotator cuff injury. PMH includes CAD (s/p stenting), polysubstance abuse, stroke, HTN.    PT Comments    Pt remains to have decreased insight to safety, c/o back pain, at extremely high falls risk. Pt unsafe to transfer or ambulation without assist. Cont to recommend ST-SNF as pt unsafe to return home alone and remains unable to amb safely even with 4WW. Recommend 2 WW as it is more steady. Acute PT to cont to follow.    Follow Up Recommendations  SNF;Supervision/Assistance - 24 hour     Equipment Recommendations  None recommended by PT    Recommendations for Other Services       Precautions / Restrictions Precautions Precautions: Fall Restrictions Weight Bearing Restrictions: No    Mobility  Bed Mobility Overal bed mobility: Needs Assistance Bed Mobility: Supine to Sit     Supine to sit: Min assist     General bed mobility comments: pt initiated bringing LEs off EOB, minA for trunk elevation, increased time  Transfers Overall transfer level: Needs assistance Equipment used: 4-wheeled walker Transfers: Sit to/from Stand Sit to Stand: Min assist         General transfer comment: v/c's for safety, minA to steady due to instabiliy and significant movement of 4WW due to pt not breaking it  Ambulation/Gait Ambulation/Gait assistance: Mod assist Gait Distance (Feet): 30 Feet(15'x2) Assistive device: 4-wheeled walker Gait Pattern/deviations: Step-through pattern;Trunk flexed;Decreased stride length;Drifts right/left Gait velocity: slow Gait velocity interpretation: <1.8 ft/sec, indicate of risk for  recurrent falls General Gait Details: pt very unsteady, pt not listening to PT for safe ambulation cues, pt perseveratining on leg giving out and he would fall, RN and PT educated pt he has a chair behind him incase he needs to sit however pt impulsive turned around and through himself on the bed   Stairs             Wheelchair Mobility    Modified Rankin (Stroke Patients Only)       Balance Overall balance assessment: Needs assistance Sitting-balance support: No upper extremity supported;Feet supported Sitting balance-Leahy Scale: Fair     Standing balance support: During functional activity;Single extremity supported Standing balance-Leahy Scale: Poor Standing balance comment: relies on UE support                            Cognition Arousal/Alertness: Awake/alert Behavior During Therapy: WFL for tasks assessed/performed Overall Cognitive Status: No family/caregiver present to determine baseline cognitive functioning Area of Impairment: Safety/judgement;Following commands;Awareness;Problem solving                       Following Commands: Follows one step commands with increased time;Follows one step commands inconsistently Safety/Judgement: Decreased awareness of safety;Decreased awareness of deficits Awareness: Emergent Problem Solving: Slow processing;Difficulty sequencing;Requires verbal cues;Requires tactile cues General Comments: pt with decreased safety awareness requiring assist to prevent fall      Exercises      General Comments General comments (skin integrity, edema, etc.): vss      Pertinent Vitals/Pain Pain Assessment: No/denies pain    Home Living  Prior Function            PT Goals (current goals can now be found in the care plan section) Progress towards PT goals: Progressing toward goals    Frequency    Min 2X/week      PT Plan Current plan remains appropriate     Co-evaluation              AM-PAC PT "6 Clicks" Mobility   Outcome Measure  Help needed turning from your back to your side while in a flat bed without using bedrails?: A Little Help needed moving from lying on your back to sitting on the side of a flat bed without using bedrails?: A Little Help needed moving to and from a bed to a chair (including a wheelchair)?: A Little Help needed standing up from a chair using your arms (e.g., wheelchair or bedside chair)?: A Little Help needed to walk in hospital room?: A Lot Help needed climbing 3-5 steps with a railing? : A Lot 6 Click Score: 16    End of Session Equipment Utilized During Treatment: Gait belt Activity Tolerance: Patient tolerated treatment well;Patient limited by pain Patient left: in bed;with call bell/phone within reach;with bed alarm set Nurse Communication: Mobility status;Patient requests pain meds PT Visit Diagnosis: Unsteadiness on feet (R26.81);Muscle weakness (generalized) (M62.81)     Time: [id number] PT Time Calculation (min) (ACUTE ONLY): 17 min  Charges:  $Gait Training: 8-22 mins                     [name], PT, DPT Acute Rehabilitation Services Pager #: [phone] Office #: [phone]    [name] 05/11/2019, 1:54 PM

## 2019-05-11 NOTE — Progress Notes (Signed)
Report called to [facility] Lamarr Lulas).Patient IV removed per order.  Copy of AVS sent to facility. Patients VSS at discharge. Patient was discharged with PTar to facility.

## 2019-05-11 NOTE — Progress Notes (Signed)
   Subjective: [name] is doing well today.  He denies any complaint.  No palpitation.  Shoulder pain is much improved.  Objective:  Vital signs in last 24 hours: Vitals:   05/11/19 0300 05/11/19 0734 05/11/19 0949 05/11/19 1307  BP: (!) 141/74 117/76 132/89 121/70  Pulse: 64 62 (!) 115 (!) 53  Resp: (!) 30 (!) 21  18  Temp: 97.6 F (36.4 C) 97.8 F (36.6 C)  97.7 F (36.5 C)  TempSrc: Oral Oral  Oral  SpO2: 98%   97%  Weight:      Height:       General: No acute distress CV: RRR, normal S1-S2 Pulmonary exam: CTA bilaterally Extremities: No lower extremity edema, pulses are present bilaterally  Assessment/Plan:  Principal Problem:   Streptococcal bacteremia Active Problems:   Coronary artery disease   Hypertension   Tobacco abuse   Alcohol abuse   Diabetes mellitus without complication (HCC)   Cocaine abuse (HCC)   Suicidal ideation   Chest pain   Atrial fibrillation (HCC)   Substance induced mood disorder (HCC)  63 year old male with past medical history of CAD status post stent, DM HTN, history of CVA,alcohol and cocaine use, smoking, MDD came to the emergency department today due toworsening of intermittentchest painin setting of chest pain.He is currently treated for streptococcal bacteremia.  Streptococcus Gbacteriemia: Afebrile and asymptomatic.  Cytosis improved.  Will DC penicillin and switch to p.o. amoxicillin. clinicaly stable to discharge to SNF  -DC penicillin -Will discharge to SNF today -continue amoxicillin for 5 more days after discharge to finish 2 weeks of treatment.   HTN:BP controled off of AMlodipine Resumed home meds5/14 as pt became hypertensive.  had to d/c amlodipine so we can use cardizem if needed. Few other good options for the patient with his comorbidities.   -Continue lisinopril 20 mg daily -Continuecarvedilol 25 mg twice daily. -Keep holding Amlodipine  Rt rotator cuff injury: Pain improved  Orthopedicsreviewed MRI and recommended outpatient referral as needed and PT  -Naproxen250mg  BID PRN -ContinueTylenoland Voltaren gelPRN -PT/OT   Chest pain in setting of cocaine use :Chest painresolved.Medically managed.  (UDS waspositive for cocaine. No evidence ofacuteischemia. Stress testperformed and was low risk per cardiology.No further work up at this point.)  -Continue Plavix 75 mg QD -Continue home Lipitor  -BMPdaily  A-fibwith RVR: starting low dose of Diltiazem due to RVR  -Continue Carvedilol 25 mg twice daily -Cardiac monitoring -will discharge to SNF with SR Diltiazem 60 mg BID and will follow up with PCP and Cardiologist for reassessment and adjustment  Normocytic Anemia:Chronic IDA.Hbisstable. -Continuehome ferrous sulfate  DM II: DC home with home  Metfromin 1000mg  BID  Dispo: DC SNF today  William Pretty, MD 05/11/2019, 2:49 PM Pager: [phone]

## 2019-05-11 NOTE — TOC Transition Note (Signed)
Transition of Care [facility]) - CM/SW Discharge Note   Patient Details  Name: William Gonzalez MRN: [id number] Date of Birth: Jan 31, 1956  Transition of Care [facility]) CM/SW Contact:  Eduard Roux, LCSWA Phone Number: 05/11/2019, 3:00 PM   Clinical Narrative:     Patient will DC to: [facility] DC Date: 05/11/2019 Family Notified: Veto Kemps- CSW left voice message- patient is alert and oriented  Transport [id number]  RN, patient, and facility notified of DC. Discharge Summary sent to facility. RN given number for report [phone].  Ambulance transport requested for patient.   Clinical Social Worker signing off. Antony Blackbird, MSW, [facility] Clinical Social Worker [phone]    Final next level of care: Skilled Nursing Facility Barriers to Discharge: Barriers Resolved   Patient Goals and CMS Choice Patient states their goals for this hospitalization and ongoing recovery are:: stay clean of alcohol and drugs and get my walking balance back CMS Medicare.gov Compare Post Acute Care list provided to:: Patient Choice offered to / list presented to : Patient  Discharge Placement   Existing PASRR number confirmed : 05/02/19          Patient chooses bed at: ([facility]) Patient to be transferred to facility by: [facility]AR Name of family member notified: [name], patient's son- CSW left voice message- patient is alert and oriented  Patient and family notified of of transfer: 05/11/19  Discharge Plan and Services In-house Referral: Clinical Social Work Discharge Planning Services: CM Consult                                 Social Determinants of Health (SDOH) Interventions     Readmission Risk Interventions Readmission Risk Prevention Plan 04/29/2019 04/29/2019  Transportation Screening - Complete  Medication Review Oceanographer) Complete Complete  PCP or Specialist appointment within 3-5 days of discharge (No Data) -  HRI or Home Care Consult Complete -   SW Recovery Care/Counseling Consult Complete -  Palliative Care Screening Not Applicable -  Skilled Nursing Facility Not Complete -  Some recent data might be hidden

## 2019-05-14 LAB — CULTURE, BLOOD (ROUTINE X 2)
Culture: NO GROWTH
Culture: NO GROWTH
Special Requests: ADEQUATE

## 2019-07-27 ENCOUNTER — Encounter (HOSPITAL_COMMUNITY): Payer: Self-pay | Admitting: Emergency Medicine

## 2019-07-27 ENCOUNTER — Other Ambulatory Visit: Payer: Self-pay

## 2019-07-27 ENCOUNTER — Emergency Department (HOSPITAL_COMMUNITY)
Admission: EM | Admit: 2019-07-27 | Discharge: 2019-07-28 | Disposition: A | Discharge status: Other Acute Inpatient Hospital | Payer: No Typology Code available for payment source | Attending: Emergency Medicine | Admitting: Emergency Medicine

## 2019-07-27 DIAGNOSIS — I10 Essential (primary) hypertension: Secondary | ICD-10-CM | POA: Insufficient documentation

## 2019-07-27 DIAGNOSIS — F17210 Nicotine dependence, cigarettes, uncomplicated: Secondary | ICD-10-CM | POA: Insufficient documentation

## 2019-07-27 DIAGNOSIS — F329 Major depressive disorder, single episode, unspecified: Secondary | ICD-10-CM | POA: Diagnosis present

## 2019-07-27 DIAGNOSIS — Z20828 Contact with and (suspected) exposure to other viral communicable diseases: Secondary | ICD-10-CM | POA: Diagnosis not present

## 2019-07-27 DIAGNOSIS — R45851 Suicidal ideations: Secondary | ICD-10-CM

## 2019-07-27 DIAGNOSIS — Z79899 Other long term (current) drug therapy: Secondary | ICD-10-CM | POA: Insufficient documentation

## 2019-07-27 DIAGNOSIS — Z7902 Long term (current) use of antithrombotics/antiplatelets: Secondary | ICD-10-CM | POA: Insufficient documentation

## 2019-07-27 DIAGNOSIS — F1020 Alcohol dependence, uncomplicated: Secondary | ICD-10-CM | POA: Insufficient documentation

## 2019-07-27 DIAGNOSIS — E119 Type 2 diabetes mellitus without complications: Secondary | ICD-10-CM | POA: Diagnosis not present

## 2019-07-27 DIAGNOSIS — I259 Chronic ischemic heart disease, unspecified: Secondary | ICD-10-CM | POA: Diagnosis not present

## 2019-07-27 DIAGNOSIS — Z7984 Long term (current) use of oral hypoglycemic drugs: Secondary | ICD-10-CM | POA: Diagnosis not present

## 2019-07-27 DIAGNOSIS — F1094 Alcohol use, unspecified with alcohol-induced mood disorder: Secondary | ICD-10-CM | POA: Diagnosis present

## 2019-07-27 DIAGNOSIS — Z955 Presence of coronary angioplasty implant and graft: Secondary | ICD-10-CM | POA: Insufficient documentation

## 2019-07-27 DIAGNOSIS — F332 Major depressive disorder, recurrent severe without psychotic features: Secondary | ICD-10-CM | POA: Insufficient documentation

## 2019-07-27 LAB — RAPID URINE DRUG SCREEN, HOSP PERFORMED
Amphetamines: NOT DETECTED
Barbiturates: NOT DETECTED
Benzodiazepines: NOT DETECTED
Cocaine: POSITIVE — AB
Opiates: NOT DETECTED
Tetrahydrocannabinol: NOT DETECTED

## 2019-07-27 LAB — TROPONIN I (HIGH SENSITIVITY)
Troponin I (High Sensitivity): 19 ng/L — ABNORMAL HIGH (ref <18)
Troponin I (High Sensitivity): 17 ng/L (ref <18)

## 2019-07-27 LAB — ETHANOL: Alcohol, Ethyl (B): <10 mg/dL (ref <10)

## 2019-07-27 LAB — COMPREHENSIVE METABOLIC PANEL
ALT: 12 U/L (ref 0–44)
AST: 13 U/L — ABNORMAL LOW (ref 15–41)
Albumin: 3.9 g/dL (ref 3.5–5.0)
Alkaline Phosphatase: 123 U/L (ref 38–126)
Anion gap: 10 (ref 5–15)
BUN: 13 mg/dL (ref 8–23)
CO2: 21 mmol/L — ABNORMAL LOW (ref 22–32)
Calcium: 9.4 mg/dL (ref 8.9–10.3)
Chloride: 105 mmol/L (ref 98–111)
Creatinine, Ser: 0.88 mg/dL (ref 0.61–1.24)
GFR calc Af Amer: >60 mL/min (ref >60)
GFR calc non Af Amer: >60 mL/min (ref >60)
Glucose, Bld: 198 mg/dL — ABNORMAL HIGH (ref 70–99)
Potassium: 4 mmol/L (ref 3.5–5.1)
Sodium: 136 mmol/L (ref 135–145)
Total Bilirubin: 0.6 mg/dL (ref 0.3–1.2)
Total Protein: 7.1 g/dL (ref 6.5–8.1)

## 2019-07-27 LAB — CBC
HCT: 36 % — ABNORMAL LOW (ref 39.0–52.0)
Hemoglobin: 12 g/dL — ABNORMAL LOW (ref 13.0–17.0)
MCH: 32.8 pg (ref 26.0–34.0)
MCHC: 33.3 g/dL (ref 30.0–36.0)
MCV: 98.4 fL (ref 80.0–100.0)
Platelets: 371 10*3/uL (ref 150–400)
RBC: 3.66 MIL/uL — ABNORMAL LOW (ref 4.22–5.81)
RDW: 12.5 % (ref 11.5–15.5)
WBC: 11.7 10*3/uL — ABNORMAL HIGH (ref 4.0–10.5)
nRBC: 0 % (ref 0.0–0.2)

## 2019-07-27 LAB — SARS CORONAVIRUS 2 BY RT PCR (HOSPITAL ORDER, PERFORMED IN ~~LOC~~ HOSPITAL LAB): SARS Coronavirus 2: NEGATIVE

## 2019-07-27 LAB — ACETAMINOPHEN LEVEL: Acetaminophen (Tylenol), Serum: <10 ug/mL — ABNORMAL LOW (ref 10–30)

## 2019-07-27 LAB — CBG MONITORING, ED: Glucose-Capillary: 128 mg/dL — ABNORMAL HIGH (ref 70–99)

## 2019-07-27 LAB — SALICYLATE LEVEL: Salicylate Lvl: <7 mg/dL (ref 2.8–30.0)

## 2019-07-27 NOTE — Progress Notes (Addendum)
Pt accepted to [facility] for admission on 07/28/19 Marylou Flesher, MD is the accepting/attending provider.  Call report to [phone] [facility] ED notified.   Pt is Voluntary.  Pt may be transported by [facility]  Pt scheduled  to arrive at [facility] at/or after 10:00 AM  Areatha Keas. Judi Cong, MSW, [facility] Disposition Clinical Social Work [phone] (cell) [phone] (office)  Patient to have COVID-19 resulted prior to transfer

## 2019-07-27 NOTE — ED Notes (Signed)
Pt reports he had some chest pain that is resolved with the medication EMS gave him and he is suicidal. Pt reports the care he is receiving from the [place] is bad and they won't help him with his thoughts.

## 2019-07-27 NOTE — BH Assessment (Signed)
Tele Assessment Note   Patient Name: William Gonzalez MRN: [id number] Referring Physician: Hyacinth MeekerMiller [facility] Location of Provider: Behavioral Health TTS Department  William Gonzalez is an 63 y.o. male [facility] complaining of suicidal ideation with a plan to overdose on his psychiatric medications. Patient is a poor historian due to AMS. Patient expressed frustration that [facility] and that triggered him to feel suicidal. Patient states he would have overdosed this AM if his cousin had not come and stopped him. He denies HI/AVH. [facility] with a similar complaint. Patient reports daily alcohol use and frequent cocaine use. He states this morning he drank a pint of wine and used $20 worth of cocaine prior to coming to the hospital.   Patient is alert and oriented x 4. He is dressed in scrubs sitting upright in bed. Patient's speech is slurred, eye contact is poor, and thoughts are organized. Patient's mood is depressed/irritable and affect is congruent. His insight, judgement and impulse control are impaired. Patient does not appear to be responding to internal stimuli or experiencing delusional thought content.   Diagnosis: F33.2 MDD, recurrent, severe   F10.20 Alcohol use disorder, severe  Past Medical History:  Past Medical History:  Diagnosis Date  . Alcohol abuse   . Coronary artery disease   . Diabetes mellitus   . Hypertension   . Stented coronary artery   . Stroke (HCC)   . Tobacco abuse     Past Surgical History:  Procedure Laterality Date  . CORONARY STENT PLACEMENT      Family History:  Family History  Problem Relation Age of Onset  . Hypertension Mother   . Diabetes Mother   . Hypertension Father   . Diabetes Father   . Hypertension Sister   . Hypertension Brother   . Diabetes Sister   . Diabetes Brother     Social History:  reports that  he has been smoking cigarettes. He has been smoking about 0.30 packs per day. He has never used smokeless tobacco. He reports current alcohol use. He reports current drug use. Drug: Cocaine.  Additional Social History:  Alcohol / Drug Use Pain Medications: see MAR Prescriptions: see MAR Over the Counter: see MAR History of alcohol / drug use?: Yes Substance #1 Name of Substance 1: alcohol 1 - Age of First Use: uknown 1 - Amount (size/oz): 1 pint liquor and 12 beers 1 - Frequency: daily 1 - Duration: unknown 1 - Last Use / Amount: 8/3- 1 pint of wine Substance #2 Name of Substance 2: cocaine 2 - Age of First Use: unknown 2 - Amount (size/oz): varies 2 - Frequency: intermittently 2 - Duration: unknown 2 - Last Use / Amount: 8/3- $20 worth  CIWA: CIWA-Ar BP: (!) 158/66 Pulse Rate: 73 COWS:    Allergies:  Allergies  Allergen Reactions  . Iodine Anaphylaxis and Swelling    Swells all over    Home Medications: (Not in a hospital admission)   OB/GYN Status:  No LMP for male patient.  General Assessment Data [facility] TTS Assessment: In system Is this a Tele or Face-to-Face Assessment?: Tele Assessment Is this an Initial Assessment or a Re-assessment for this encounter?: Initial Assessment Patient Accompanied by:: N/A Language Other than English: No Living Arrangements: ([place]) What gender do you identify as?: Male Marital status: Single Maiden name:  Prentiss BellsWaterman Pregnancy Status: No Living Arrangements: Alone Can pt return to current living arrangement?: Yes Admission Status: Voluntary Is patient capable of signing voluntary admission?: Yes Referral Source: Self/Family/Friend Insurance type: Medicare     Crisis Care Plan Living Arrangements: Alone Legal Guardian: (self) [facility]ist[facility]  Education Status Is patient currently in school?: No Is the patient employed, unemployed or receiving disability?: Receiving  disability income  Risk to self with the past 6 months Suicidal Ideation: Yes-Currently Present Has patient been a risk to self within the past 6 months prior to admission? : Yes Suicidal Intent: Yes-Currently Present Has patient had any suicidal intent within the past 6 months prior to admission? : Yes Is patient at risk for suicide?: Yes Suicidal Plan?: Yes-Currently Present Has patient had any suicidal plan within the past 6 months prior to admission? : Yes Specify Current Suicidal Plan: overdose Access to Means: Yes Specify Access to Suicidal Means: access to prescriptions, alcohol, cocaine What has been your use of drugs/alcohol within the last 12 months?: daily alcohol and cocaine use Previous Attempts/Gestures: Yes How many times?: 1 Other Self Harm Risks: none noted Triggers for Past Attempts: None known Intentional Self Injurious Behavior: None Family Suicide History: No Recent stressful life event(s): Other (Comment)([facility] Persecutory voices/beliefs?: No Depression: Yes Depression Symptoms: Despondent, Insomnia, Tearfulness, Isolating, Fatigue, Guilt, Loss of interest in usual pleasures, Feeling worthless/self pity, Feeling angry/irritable Substance abuse history and/or treatment for substance abuse?: Yes Suicide prevention information given to non-admitted patients: Not applicable  Risk to Others within the past 6 months Homicidal Ideation: No Does patient have any lifetime risk of violence toward others beyond the six months prior to admission? : No Thoughts of Harm to Others: No Current Homicidal Intent: No Current Homicidal Plan: No Access to Homicidal Means: No Identified Victim: none History of harm to others?: No Assessment of Violence: None Noted Violent Behavior Description: none noted Does patient have access to weapons?: No Criminal Charges Pending?: No Does patient have a court date: No Is patient on probation?:  No  Psychosis Hallucinations: None noted Delusions: None noted  Mental Status Report Appearance/Hygiene: In scrubs, Bizarre Eye Contact: Poor Motor Activity: Freedom of movement Speech: Slurred, Logical/coherent Level of Consciousness: Alert Mood: Depressed Affect: Depressed Anxiety Level: Minimal Thought Processes: Circumstantial Judgement: Impaired Orientation: Person, Place, Time, Situation Obsessive Compulsive Thoughts/Behaviors: None  Cognitive Functioning Concentration: Normal Memory: Recent Intact, Remote Intact Is patient IDD: No Insight: Fair Impulse Control: Poor Appetite: Poor Have you had any weight changes? : No Change Sleep: Unable to Assess Total Hours of Sleep: (UTA) Vegetative Symptoms: None  ADLScreening [facility] Assessment Services) Patient's cognitive ability adequate to safely complete daily activities?: Yes Patient able to express need for assistance with ADLs?: Yes Independently performs ADLs?: Yes (appropriate for developmental age)  Prior Inpatient Therapy Prior Inpatient Therapy: Yes Prior Therapy Dates: 2020, 2019 [facility] Reason for Treatment: depression  Prior Outpatient Therapy Prior Outpatient Therapy: Yes Prior Therapy Dates: ongoin [facility] Reason for Treatment: depression Does patient have an ACCT team?: No Does patient have Intensive In-House Services?  : No Does patient have [facility] services? : No Does patient have P4CC services?: No  ADL Screening (condition at time of admission) Patient's cognitive ability adequate to safely complete daily activities?: Yes Is the patient deaf or have difficulty hearing?: No Does the patient have difficulty seeing, even when wearing glasses/contacts?: No Does the patient have  difficulty concentrating, remembering, or making decisions?: No Patient able to express need for assistance with ADLs?: Yes Does the patient have difficulty  dressing or bathing?: No Independently performs ADLs?: Yes (appropriate for developmental age) Does the patient have difficulty walking or climbing stairs?: No Weakness of Legs: None Weakness of Arms/Hands: None  Home Assistive Devices/Equipment Home Assistive Devices/Equipment: None  Therapy Consults (therapy consults require a physician order) PT Evaluation Needed: No OT Evalulation Needed: No SLP Evaluation Needed: No Abuse/Neglect Assessment (Assessment to be complete while patient is alone) Abuse/Neglect Assessment Can Be Completed: Yes Physical Abuse: Denies Verbal Abuse: Denies Sexual Abuse: Denies Exploitation of patient/patient's resources: Denies Self-Neglect: Denies Values / Beliefs Cultural Requests During Hospitalization: None Spiritual Requests During Hospitalization: None Consults Spiritual Care Consult Needed: No Social Work Consult Needed: No Regulatory affairs officer (For Healthcare) Does Patient Have a Medical Advance Directive?: No Would patient like information on creating a medical advance directive?: No - Patient declined          Disposition: Mordecai Maes, NP recommends in patient Disposition Initial Assessment Completed for this Encounter: Yes  This service was provided via telemedicine using a 2-way, interactive audio and video technology.  Names of all persons participating in this telemedicine service and their role in this encounter. Name: William Gonzalez Role: patient  Name: Orvis Brill, LCSW Role: TTS  Name:  Role:   Name:  Role:     Orvis Brill 07/27/2019 12:18 PM

## 2019-07-27 NOTE — ED Provider Notes (Signed)
[place] EMERGENCY DEPARTMENT Provider Note   CSN: [id number] Arrival date & time: 07/27/19  1038     History   Chief Complaint No chief complaint on file.   HPI William Gonzalez is a 63 y.o. male.     HPI  The patient is a 64 year old male, he has a known history of prior stroke, prior coronary disease status post stenting, history of diabetes and hypertension and alcohol abuse.  The patient currently is living in this area, he goes to the [facility] when he needs to be seen but states that he is fed up with that system saying that they do not return his calls, he could not be seen until November and was having some psychiatric problems including worsening depression which led him to use both cocaine and drink heavily this morning.  He reports he has been thinking of ways to kill himself including taking an overdose.  He states "if there was a way I could do it quickly I would have already done it".  The patient endorses having some intermittent chest pain over the week but states he is chest pain-free at this time.  It is not exertional, it is poorly described.  He is not short of breath diaphoretic or nauseated.  Past Medical History:  Diagnosis Date  . Alcohol abuse   . Coronary artery disease   . Diabetes mellitus   . Hypertension   . Stented coronary artery   . Stroke ([place])   . Tobacco abuse     Patient Active Problem List   Diagnosis Date Noted  . Streptococcal bacteremia 05/07/2019  . Substance induced mood disorder ([place])   . Atrial fibrillation ([place]) 04/29/2019  . Chest pain 04/28/2019  . MDD (major depressive disorder), recurrent episode ([place]) 04/21/2019  . Suicidal ideation   . Alcohol dependence ([place]) 04/13/2019  . MDD (major depressive disorder), recurrent severe, without psychosis ([place]) 04/08/2019  . Anemia 04/18/2018  . Ataxic gait   . Cocaine abuse ([place])   . Cerebellar infarct ([place]) 04/21/2015  . Tobacco abuse  04/21/2015  . Alcohol abuse 04/21/2015  . Stroke ([place]) 04/21/2015  . Diabetes mellitus without complication ([place]) 10/62/6948  . Coronary artery disease   . Hypertension     Past Surgical History:  Procedure Laterality Date  . CORONARY STENT PLACEMENT          Home Medications    Prior to Admission medications   Medication Sig Start Date End Date Taking? Authorizing Provider  albuterol (VENTOLIN HFA) 108 (90 Base) MCG/ACT inhaler Inhale 1 puff into the lungs every 6 (six) hours as needed for wheezing or shortness of breath.    [provider]  amantadine (SYMMETREL) 100 MG capsule Take 1 capsule (100 mg total) by mouth 2 (two) times daily. For cravings Patient not taking: Reported on 04/29/2019 04/13/19   Connye Burkitt, NP  ARIPiprazole (ABILIFY) 2 MG tablet Take 1 tablet (2 mg total) by mouth daily for 30 days. 04/21/19 05/21/19  Long, Wonda Olds, MD  atorvastatin (LIPITOR) 40 MG tablet Take 1 tablet (40 mg total) by mouth daily at 6 PM. 04/21/18   Shelly Coss, MD  carvedilol (COREG) 25 MG tablet Take 25 mg by mouth 2 (two) times a day. If sbp less than 110 or heart rate 55    [provider]  cetirizine (ZYRTEC) 10 MG tablet Take 10 mg by mouth daily.    [provider]  clopidogrel (PLAVIX) 75  MG tablet Take 1 tablet (75 mg total) by mouth daily. 04/22/18   Burnadette PopAdhikari, Amrit, MD  diclofenac sodium (VOLTAREN) 1 % GEL Apply 2 g topically every 8 (eight) hours as needed (For pain). 05/11/19   Masoudi, Shawna OrleansElhamalsadat, MD  diltiazem (CARDIZEM SR) 60 MG 12 hr capsule Take 1 capsule (60 mg total) by mouth 2 (two) times daily. 05/11/19   Masoudi, Elhamalsadat, MD  ferrous sulfate 325 (65 FE) MG tablet Take 325 mg by mouth See admin instructions. Monday Wednesday Friday    [provider]  latanoprost (XALATAN) 0.005 % ophthalmic solution Place 1 drop into both eyes at bedtime.    [provider]  lisinopril (PRINIVIL,ZESTRIL) 20 MG tablet Take 1 tablet (20  mg total) by mouth daily. 04/21/18   Burnadette PopAdhikari, Amrit, MD  metFORMIN (GLUCOPHAGE) 1000 MG tablet Take 1 tablet (1,000 mg total) by mouth 2 (two) times daily with a meal. Patient not taking: Reported on 04/29/2019 07/04/12   Molpus, John, MD  miconazole (MICOTIN) 2 % powder Apply 1 application topically 2 (two) times daily as needed for itching (groin).    [provider]  nicotine (NICODERM CQ - DOSED IN MG/24 HOURS) 21 mg/24hr patch Place 1 patch (21 mg total) onto the skin daily. For smoking cessation Patient not taking: Reported on 04/29/2019 04/14/19   Aldean BakerSykes, Janet E, NP  omeprazole (PRILOSEC) 20 MG capsule Take 20 mg by mouth daily.    [provider]  ramelteon (ROZEREM) 8 MG tablet Take 1 tablet (8 mg total) by mouth at bedtime. 05/11/19   Masoudi, Shawna OrleansElhamalsadat, MD  sildenafil (VIAGRA) 100 MG tablet Take 50 mg by mouth daily as needed for erectile dysfunction.    [provider]  vortioxetine HBr (TRINTELLIX) 10 MG TABS tablet Take 1 tablet (10 mg total) by mouth daily. For mood Patient not taking: Reported on 04/29/2019 04/14/19   Aldean BakerSykes, Janet E, NP    Family History Family History  Problem Relation Age of Onset  . Hypertension Mother   . Diabetes Mother   . Hypertension Father   . Diabetes Father   . Hypertension Sister   . Hypertension Brother   . Diabetes Sister   . Diabetes Brother     Social History Social History   Tobacco Use  . Smoking status: Current Every Day Smoker    Packs/day: 0.30    Types: Cigarettes  . Smokeless tobacco: Never Used  Substance Use Topics  . Alcohol use: Yes    Comment: "as much as i can get"  . Drug use: Yes    Types: Cocaine     Allergies   Iodine   Review of Systems Review of Systems  All other systems reviewed and are negative.    Physical Exam Updated Vital Signs BP (!) 158/79   Resp (!) 21   Physical Exam Vitals signs and nursing note reviewed.  Constitutional:      General: He is not in acute  distress.    Appearance: He is well-developed.  HENT:     Head: Normocephalic and atraumatic.     Mouth/Throat:     Pharynx: No oropharyngeal exudate.  Eyes:     General: No scleral icterus.       Right eye: No discharge.        Left eye: No discharge.     Conjunctiva/sclera: Conjunctivae normal.     Pupils: Pupils are equal, round, and reactive to light.  Neck:     Musculoskeletal: Normal range  of motion and neck supple.     Thyroid: No thyromegaly.     Vascular: No JVD.  Cardiovascular:     Rate and Rhythm: Normal rate and regular rhythm.     Heart sounds: Normal heart sounds. No murmur. No friction rub. No gallop.   Pulmonary:     Effort: Pulmonary effort is normal. No respiratory distress.     Breath sounds: Normal breath sounds. No wheezing or rales.  Abdominal:     General: Bowel sounds are normal. There is no distension.     Palpations: Abdomen is soft. There is no mass.     Tenderness: There is no abdominal tenderness.  Musculoskeletal: Normal range of motion.        General: No tenderness.  Lymphadenopathy:     Cervical: No cervical adenopathy.  Skin:    General: Skin is warm and dry.     Findings: No erythema or rash.  Neurological:     Mental Status: He is alert.     Coordination: Coordination normal.     Comments: Slight slurred speech, able to move all 4 extremities and has no facial droop  Psychiatric:        Behavior: Behavior normal.     Comments: Suicidal with plan to OD, Not responding to internal stimuli at this time Endorses having bad thoughts - SI thoughts.      ED Treatments / Results  Labs (all labs ordered are listed, but only abnormal results are displayed) Labs Reviewed  COMPREHENSIVE METABOLIC PANEL  ETHANOL  SALICYLATE LEVEL  ACETAMINOPHEN LEVEL  CBC  RAPID URINE DRUG SCREEN, HOSP PERFORMED  TROPONIN I (HIGH SENSITIVITY)    EKG None  Radiology No results found.  Procedures Procedures (including critical care time)   Medications Ordered in ED Medications - No data to display   Initial Impression / Assessment and Plan / ED Course  I have reviewed the triage vital signs and the nursing notes.  Pertinent labs & imaging results that were available during my care of the patient were reviewed by me and considered in my medical decision making (see chart for details).        The patient has multiple problems the least of which is his chest pain which was evaluated approximately 3 months ago when he was admitted to the hospital for chest discomfort and at that time had an echocardiogram showing an ejection fraction of 60 to 65%, no significant left ventricular motion abnormalities.  Right ventricle was also evaluated and was normal.  There was slight left ventricular hypertrophy at that time.  During that admission to the hospital he was actually admitted with streptococcal bacteremia, ended up staying approximately 2 weeks.  He was noted to have suicidal ideations and cocaine abuse at that time as well.  We will need to have psychiatric consultation, EKG shows some abnormal T waves but those have seen on prior EKGs.  Troponin pending,  Will need psychiatric consultation.  Change of shift - care signed over to oncoming EDP team  Final Clinical Impressions(s) / ED Diagnoses   Final diagnoses:  None    ED Discharge Orders    None       Eber HongMiller, Heyward Douthit, MD 07/28/19 2013

## 2019-07-27 NOTE — ED Notes (Signed)
TTS being perform at the time

## 2019-07-27 NOTE — Progress Notes (Signed)
Pt. meets criteria for inpatient treatment per Mordecai Maes, NP.  No appropriate beds available at [facility]. Referred out to the following hospitals: [facility]  [place]        Disposition CSW will continue to follow for placement.

## 2019-07-27 NOTE — ED Notes (Signed)
TTS being completed. 

## 2019-07-27 NOTE — ED Triage Notes (Signed)
Per EMS- pt here from [address], here for SI that he has had for a while, pt not satisfied with care he is getting from [place]. Pt was threatening to OD today. Pt also is c.o. CP for 1 week. CP is now resolved. ETOH & Cocaine use today.

## 2019-07-27 NOTE — ED Notes (Addendum)
Pt accepted to [facility] for admission on 07/28/19 at/or after 10 AM. Marylou Flesher, MD is the accepting/attending provider.  Call report to [phone]  Pt is voluntary and to be transported by [facility].

## 2019-07-27 NOTE — B[facility] Counselor (Signed)
Lashunda Thomas, NP recommends in patient. TTS to seek placement.  

## 2019-07-27 NOTE — ED Notes (Signed)
Pt belongings are in locker 4

## 2019-07-27 NOTE — ED Notes (Signed)
Vince requesting pt call him in the morning. No number left. RN was told that pt would have number.

## 2019-07-28 DIAGNOSIS — F1094 Alcohol use, unspecified with alcohol-induced mood disorder: Secondary | ICD-10-CM | POA: Diagnosis present

## 2019-07-28 MED ORDER — LORATADINE 10 MG PO TABS
10.0000 mg | ORAL_TABLET | Freq: Once | ORAL | Status: AC
  Administered 2019-07-28: 10 mg via ORAL
  Filled 2019-07-28: qty 1

## 2019-07-28 NOTE — ED Notes (Signed)
Pt showered w/assistance from [facility]. Pt states he does not want to call Vince at this time. Also states he does not want to let anyone know that he has been accepted to [facility]

## 2019-07-28 NOTE — ED Notes (Signed)
Pt voiced understanding of tx plan - accepted to [facility]. States he will call Vince to advise prior to leaving.

## 2020-01-25 DEATH — deceased

## 2020-05-28 IMAGING — MR MRI HEAD WITHOUT CONTRAST
9 of 11 series · 33 of 48 positions shown · non-contrast
Comparison: CT 04/25/2018.  MRI 04/19/2018.

CLINICAL DATA: Acute presentation with dizziness and
lightheadedness. Symptoms worsening. Falling. History of old
strokes.

EXAM:
MRI HEAD WITHOUT CONTRAST
TECHNIQUE: Multiplanar, multiecho pulse sequences of the brain and surrounding
structures were obtained without intravenous contrast.

[Series 3: DWI · axial · 3.0mm · 0.94mm/px · z∈[-51,+98]mm · 7 of 102 slices shown (1 of 2)]
[im 1/102]
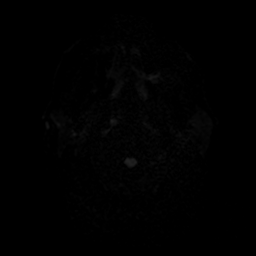
[im 17/102]
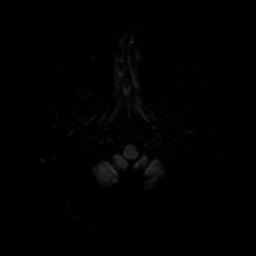
[im 34/102]
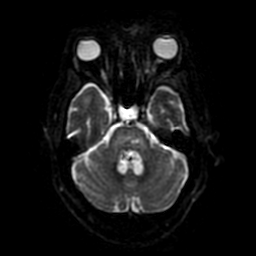
[im 51/102]
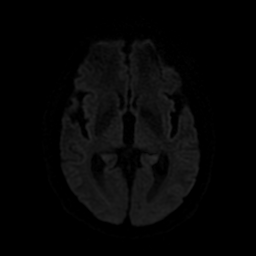
[im 68/102]
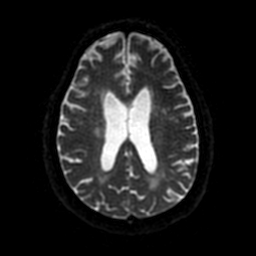
[im 85/102]
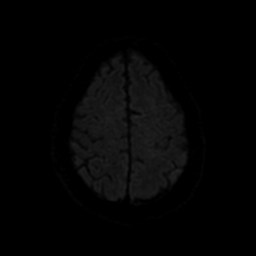
[im 102/102]
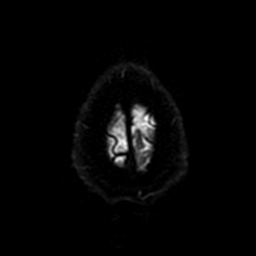

[Series 4: FLAIR · axial · 3.0mm · 0.47mm/px · z∈[-51,+98]mm · 2 of 26 slices shown (1 of 2)]
[im 1/26]
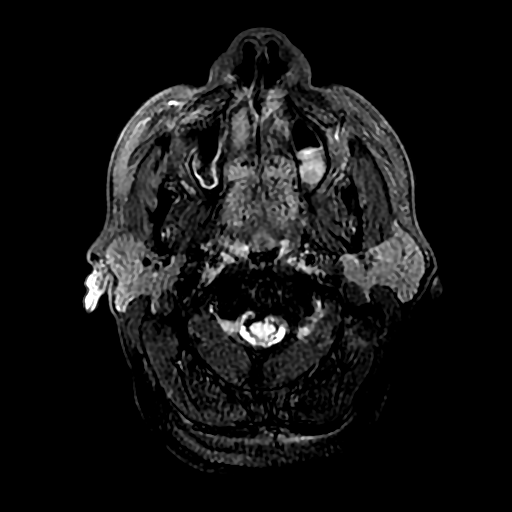
[im 26/26]
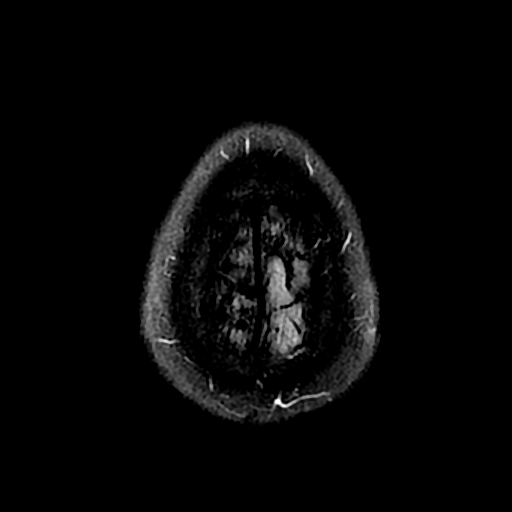

[Series 5: (person_name) · axial · 3.0mm · 0.47mm/px · z∈[-53,+34]mm · 5 of 104 slices shown]
[im 1/104]
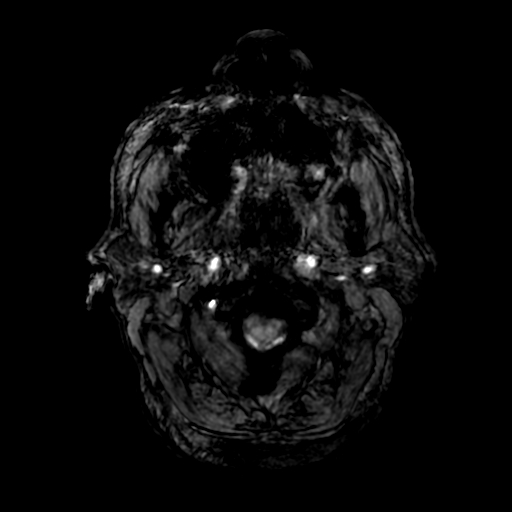
[im 15/104]
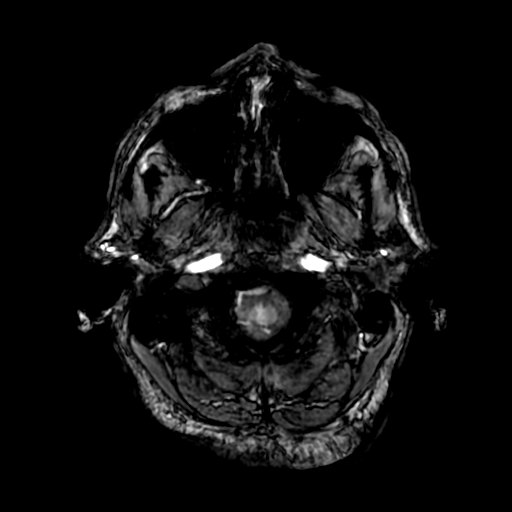
[im 30/104]
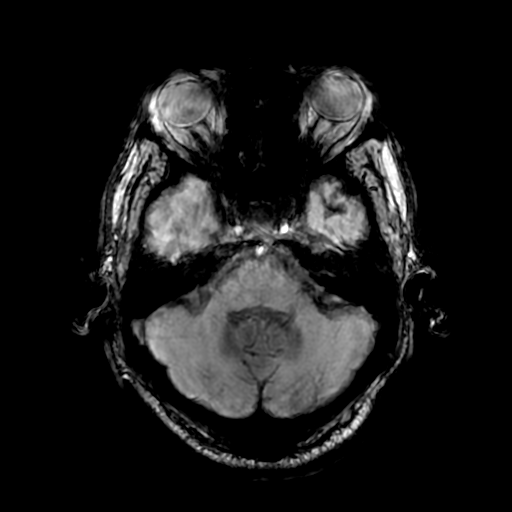
[im 45/104]
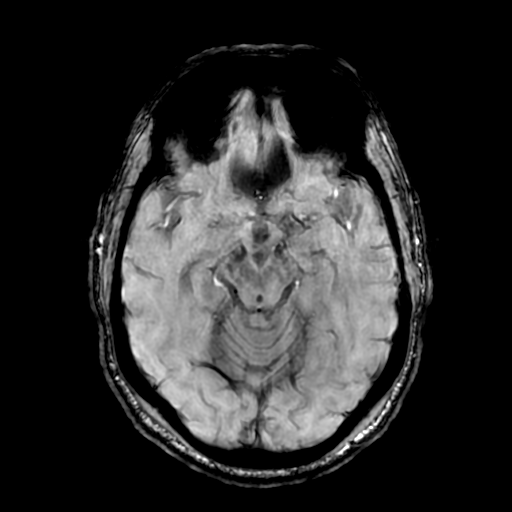
[im 59/104]
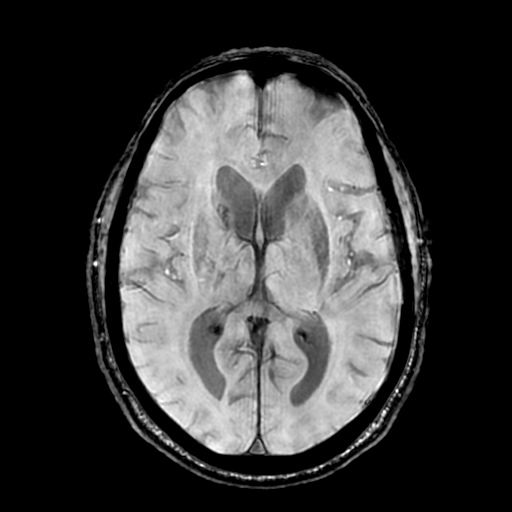

[Series 7: T2 · axial · 5.0mm · 0.47mm/px · z∈[-51,+98]mm · 2 of 26 slices shown (1 of 2)]
[im 1/26]
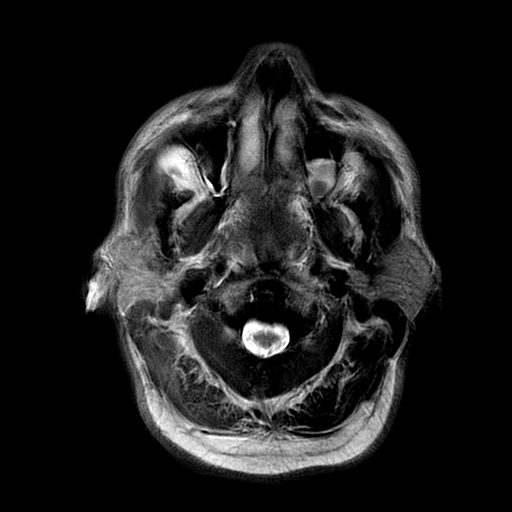
[im 26/26]
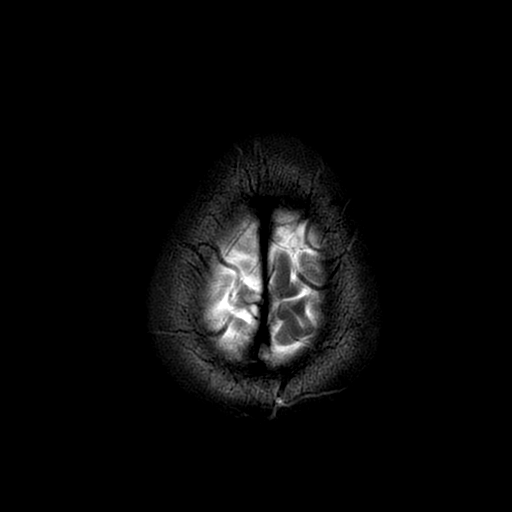

[Series 8: DWI · coronal · 4.0mm · 0.94mm/px · 6 of 76 slices shown (2 of 2)]
[im 1/76]
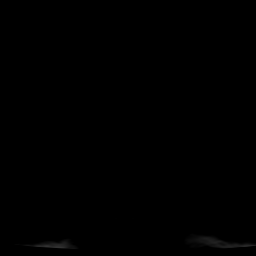
[im 16/76]
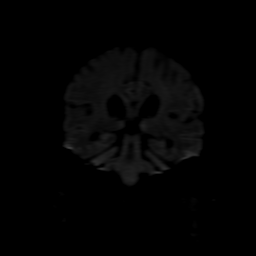
[im 31/76]
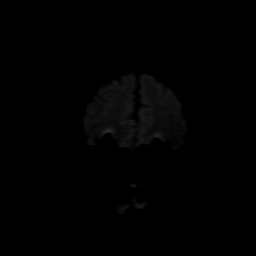
[im 46/76]
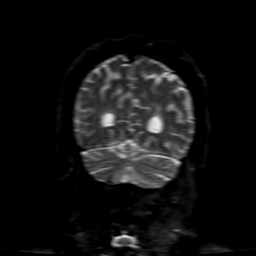
[im 61/76]
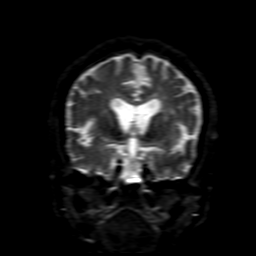
[im 76/76]
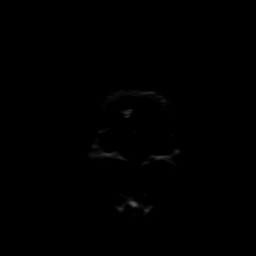

[Series 9: FLAIR · sagittal · 5.0mm · 0.47mm/px · 2 of 23 slices shown (2 of 2)]
[im 1/23]
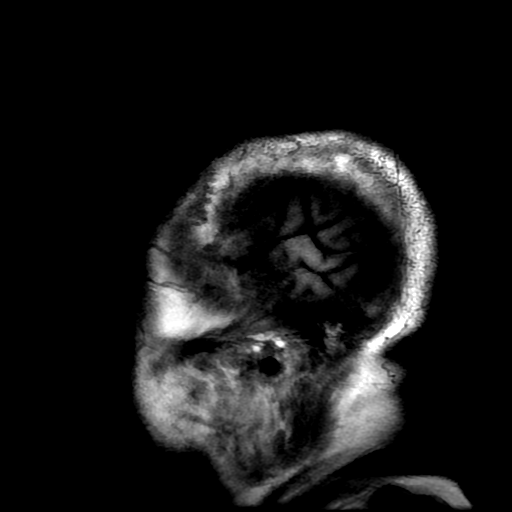
[im 23/23]
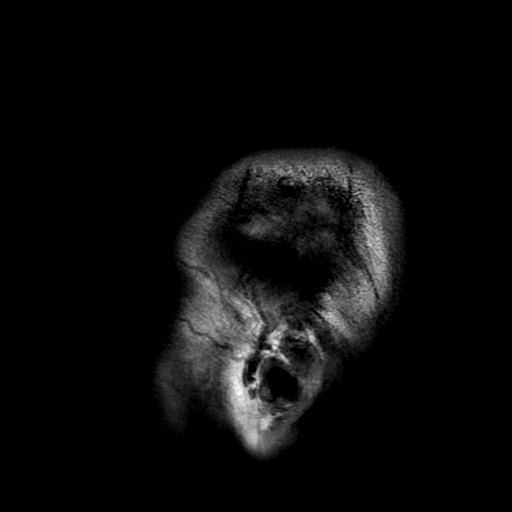

[Series 11: T2 · coronal · 5.0mm · 0.43mm/px · 2 of 32 slices shown (2 of 2)]
[im 1/32]
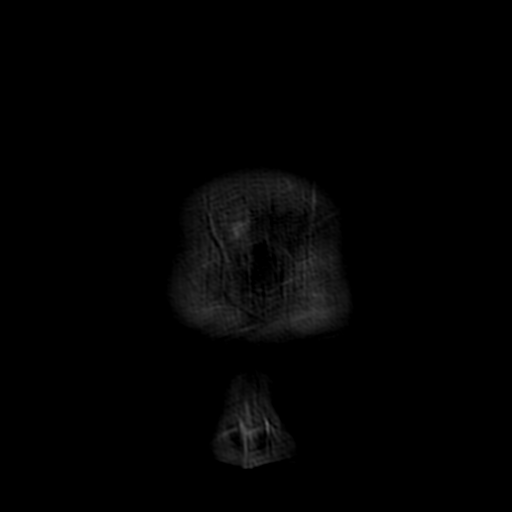
[im 32/32]
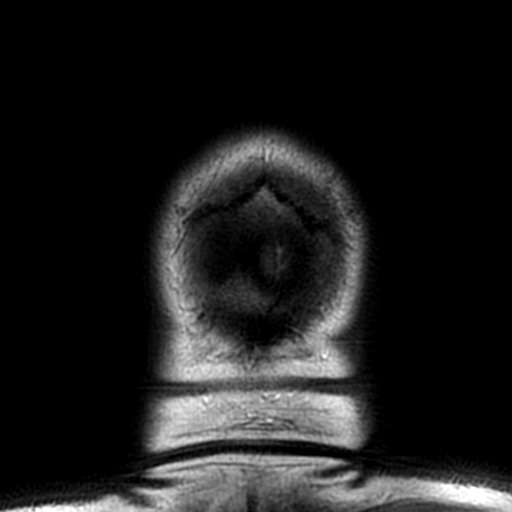

[Series 350: ADC · axial · 3.0mm · 0.94mm/px · z∈[-51,+98]mm · 4 of 51 slices shown (1 of 2)]
[im 1/51]
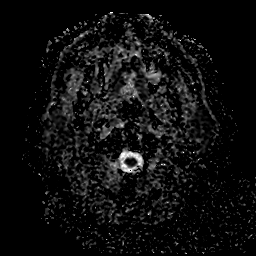
[im 17/51]
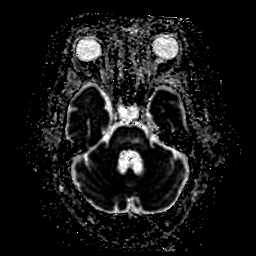
[im 34/51]
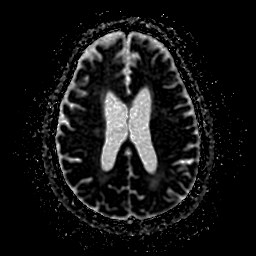
[im 51/51]
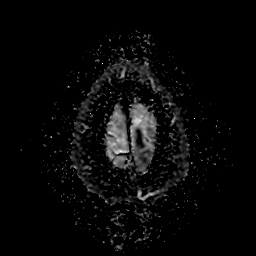

[Series 850: ADC · coronal · 4.0mm · 0.94mm/px · 3 of 38 slices shown (2 of 2)]
[im 1/38]
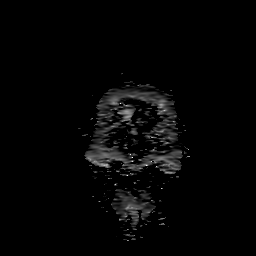
[im 19/38]
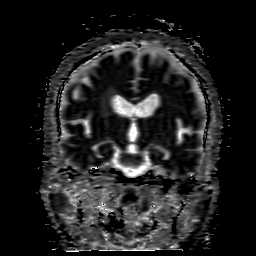
[im 38/38]
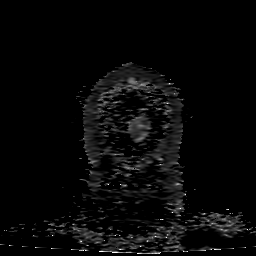

[33 of 48 positions shown; findings below may reference images not displayed]

FINDINGS: Brain: Diffusion imaging does not show any acute or subacute
infarction. There are extensive chronic ischemic changes of the pons
with old lacunar infarctions and widespread microvascular ischemic
change. No focal cerebellar insult. Cerebral hemispheres show
chronic small-vessel ischemic changes with old infarctions of the
thalami and hemispheric deep white matter. No cortical or large
vessel territory infarction. No mass lesion, hemorrhage,
hydrocephalus or extra-axial collection.

Vascular: Major vessels at the base of the brain show flow.

Skull and upper cervical spine: Chronic empty sella with arachnoid
herniation. Otherwise negative.

Sinuses/Orbits: Mild mucosal inflammation of the paranasal sinuses.
Orbits negative.

Other: None
IMPRESSION: No acute or reversible finding. Extensive chronic small-vessel
ischemic changes affecting the pons, thalami and hemispheric white
matter, progressive over time but not acute.
